# Patient Record
Sex: Male | Born: 1970 | Race: White | Hispanic: No | Marital: Single | Smoking: Former smoker
Health system: Southern US, Community
[De-identification: ages and names within clinical notes are randomized; demographics above are authoritative.]

## PROBLEM LIST (undated history)

## (undated) DIAGNOSIS — L732 Hidradenitis suppurativa: Secondary | ICD-10-CM

## (undated) DIAGNOSIS — M791 Myalgia, unspecified site: Secondary | ICD-10-CM

## (undated) DIAGNOSIS — L02415 Cutaneous abscess of right lower limb: Secondary | ICD-10-CM

## (undated) DIAGNOSIS — K429 Umbilical hernia without obstruction or gangrene: Secondary | ICD-10-CM

## (undated) DIAGNOSIS — Z8489 Family history of other specified conditions: Secondary | ICD-10-CM

## (undated) DIAGNOSIS — M519 Unspecified thoracic, thoracolumbar and lumbosacral intervertebral disc disorder: Secondary | ICD-10-CM

## (undated) DIAGNOSIS — K219 Gastro-esophageal reflux disease without esophagitis: Secondary | ICD-10-CM

## (undated) DIAGNOSIS — K2270 Barrett's esophagus without dysplasia: Secondary | ICD-10-CM

## (undated) DIAGNOSIS — H6690 Otitis media, unspecified, unspecified ear: Secondary | ICD-10-CM

## (undated) DIAGNOSIS — J40 Bronchitis, not specified as acute or chronic: Secondary | ICD-10-CM

## (undated) DIAGNOSIS — I10 Essential (primary) hypertension: Secondary | ICD-10-CM

## (undated) DIAGNOSIS — T8859XA Other complications of anesthesia, initial encounter: Secondary | ICD-10-CM

## (undated) DIAGNOSIS — E785 Hyperlipidemia, unspecified: Secondary | ICD-10-CM

## (undated) HISTORY — DX: Hidradenitis suppurativa: L73.2

## (undated) HISTORY — PX: KNEE SURGERY: SHX244

## (undated) HISTORY — PX: HERNIA REPAIR: SHX51

## (undated) HISTORY — DX: Gastro-esophageal reflux disease without esophagitis: K21.9

## (undated) HISTORY — DX: Cutaneous abscess of right lower limb: L02.415

## (undated) HISTORY — DX: Umbilical hernia without obstruction or gangrene: K42.9

## (undated) SURGERY — Surgical Case
Anesthesia: *Unknown

---

## 2000-02-08 ENCOUNTER — Encounter: Payer: Self-pay | Admitting: Emergency Medicine

## 2000-02-08 ENCOUNTER — Emergency Department (HOSPITAL_COMMUNITY): Admission: EM | Admit: 2000-02-08 | Discharge: 2000-02-08 | Payer: Self-pay | Admitting: Emergency Medicine

## 2000-02-12 ENCOUNTER — Emergency Department (HOSPITAL_COMMUNITY): Admission: EM | Admit: 2000-02-12 | Discharge: 2000-02-12 | Payer: Self-pay | Admitting: Emergency Medicine

## 2000-02-15 ENCOUNTER — Other Ambulatory Visit (HOSPITAL_COMMUNITY): Admission: RE | Admit: 2000-02-15 | Discharge: 2000-02-26 | Payer: Self-pay | Admitting: Psychiatry

## 2000-06-28 HISTORY — PX: KNEE ARTHROSCOPY WITH ANTERIOR CRUCIATE LIGAMENT (ACL) REPAIR: SHX5644

## 2000-09-19 ENCOUNTER — Emergency Department (HOSPITAL_COMMUNITY): Admission: EM | Admit: 2000-09-19 | Discharge: 2000-09-19 | Payer: Self-pay | Admitting: Emergency Medicine

## 2006-09-14 ENCOUNTER — Emergency Department (HOSPITAL_COMMUNITY): Admission: EM | Admit: 2006-09-14 | Discharge: 2006-09-14 | Payer: Self-pay | Admitting: Emergency Medicine

## 2008-11-24 ENCOUNTER — Emergency Department (HOSPITAL_COMMUNITY): Admission: EM | Admit: 2008-11-24 | Discharge: 2008-11-24 | Payer: Self-pay | Admitting: Family Medicine

## 2008-11-26 ENCOUNTER — Emergency Department (HOSPITAL_COMMUNITY): Admission: EM | Admit: 2008-11-26 | Discharge: 2008-11-26 | Payer: Self-pay | Admitting: Emergency Medicine

## 2009-01-20 ENCOUNTER — Emergency Department: Payer: Self-pay | Admitting: Emergency Medicine

## 2009-01-26 ENCOUNTER — Emergency Department: Payer: Self-pay | Admitting: Unknown Physician Specialty

## 2009-06-02 ENCOUNTER — Emergency Department (HOSPITAL_COMMUNITY): Admission: EM | Admit: 2009-06-02 | Discharge: 2009-06-02 | Payer: Self-pay | Admitting: Family Medicine

## 2010-10-05 LAB — CULTURE, ROUTINE-ABSCESS

## 2013-04-17 ENCOUNTER — Ambulatory Visit: Payer: Self-pay | Admitting: Family Medicine

## 2014-04-14 ENCOUNTER — Ambulatory Visit: Payer: Self-pay | Admitting: Physician Assistant

## 2014-04-28 ENCOUNTER — Ambulatory Visit: Payer: Self-pay | Admitting: Physician Assistant

## 2015-05-04 ENCOUNTER — Ambulatory Visit
Admission: EM | Admit: 2015-05-04 | Discharge: 2015-05-04 | Disposition: A | Discharge status: Home / Self Care | Payer: BLUE CROSS/BLUE SHIELD | Attending: Internal Medicine | Admitting: Internal Medicine

## 2015-05-04 ENCOUNTER — Encounter: Payer: Self-pay | Admitting: *Deleted

## 2015-05-04 DIAGNOSIS — J40 Bronchitis, not specified as acute or chronic: Secondary | ICD-10-CM

## 2015-05-04 DIAGNOSIS — H6501 Acute serous otitis media, right ear: Secondary | ICD-10-CM | POA: Diagnosis not present

## 2015-05-04 HISTORY — DX: Bronchitis, not specified as acute or chronic: J40

## 2015-05-04 MED ORDER — AMOXICILLIN-POT CLAVULANATE 875-125 MG PO TABS: 1.0000 | ORAL_TABLET | Freq: Two times a day (BID) | ORAL | Status: DC

## 2015-05-04 NOTE — Discharge Instructions (Signed)
One of your biggest health concerns is your smoking which increases your risk for most cancers and serious cardiovascular diseases such as strokes & heart attacks.  You should try your best to stop.  If you need assistance, please contact your PCP or Smoking Cessation Class at [facility] [phone]) or [place] (1-800-QUIT-NOW).  REST, INCREASE FLUIDS, TYLENOL AS NEEDED  MUCINEX 1-2 Twice daily

## 2015-05-04 NOTE — ED Provider Notes (Signed)
CSN: [id number]     Arrival date & time 05/04/15  4332 History   None    Chief Complaint  Patient presents with  . Cough   HPI  Bruce Graham. is a pleasant 44 y.o. male who presents with cough for 5 days.  Fever started yesterday 100-101.3.  He has productive cough with yellowish green phlegm.  He states he usually gets bronchitis once per years.  C/o nasal congestion, & ear pressure bilaterally.  Denies sore throat.  Denies headache.  He states everybody at work has been sick with similar symptoms.  He denies any chest pain.  He feels SOB when working this week but denies any wheezing or stridor.  He has tried tylenol for fever & it has helped.  He is a chronic smoker.   Past Medical History  Diagnosis Date  . Bronchitis    Past Surgical History  Procedure Laterality Date  . Knee surgery Left    History reviewed. No pertinent family history. Social History  Substance Use Topics  . Smoking status: Current Every Day Smoker  . Smokeless tobacco: Never Used  . Alcohol Use: No    Review of Systems  Constitutional: Positive for fever, chills, activity change, appetite change and fatigue. Negative for diaphoresis and unexpected weight change.  HENT: Positive for congestion, ear pain, postnasal drip, rhinorrhea, sinus pressure and sneezing. Negative for hearing loss, mouth sores, sore throat and trouble swallowing.   Eyes: Negative.   Respiratory: Positive for cough, chest tightness and shortness of breath. Negative for choking, wheezing and stridor.   Cardiovascular: Negative.   Gastrointestinal: Negative.   Genitourinary: Negative.   Musculoskeletal: Negative.   Skin: Negative.   Neurological: Negative.   Psychiatric/Behavioral: Negative.     Allergies  Review of patient's allergies indicates no known allergies.  Home Medications   Prior to Admission medications   Medication Sig Start Date End Date Taking? Authorizing Provider  amoxicillin-clavulanate (AUGMENTIN)  875-125 MG tablet Take 1 tablet by mouth 2 (two) times daily. 05/04/15   Andria Meuse, NP   Meds Ordered and Administered this Visit  Medications - No data to display  BP 135/95 mmHg  Pulse 73  Temp(Src) 97.9 F (36.6 C) (Oral)  Resp 16  Ht 5' 10"  (1.778 m)  Wt 248 lb (112.492 kg)  BMI 35.58 kg/m2  SpO2 97% No data found.   Physical Exam  Constitutional: He is oriented to person, place, and time. He appears well-developed and well-nourished. No distress.  HENT:  Head: Normocephalic and atraumatic.  Right Ear: Hearing normal. Tympanic membrane is injected, erythematous and bulging.  Left Ear: Hearing, tympanic membrane, external ear and ear canal normal.  Nose: Mucosal edema and rhinorrhea present. Right sinus exhibits no maxillary sinus tenderness and no frontal sinus tenderness. Left sinus exhibits no maxillary sinus tenderness and no frontal sinus tenderness.  Mouth/Throat: Uvula is midline, oropharynx is clear and moist and mucous membranes are normal.  Eyes: Conjunctivae are normal. No scleral icterus.  Neck: Normal range of motion.  Cardiovascular: Normal rate and regular rhythm.   Pulmonary/Chest: Effort normal and breath sounds normal. No respiratory distress.  Abdominal: Soft. Bowel sounds are normal. He exhibits no distension.  Musculoskeletal: Normal range of motion. He exhibits no edema or tenderness.  Neurological: He is alert and oriented to person, place, and time. No cranial nerve deficit.  Skin: Skin is warm and dry. No rash noted. No erythema.  Psychiatric: He has a normal mood and affect.  His behavior is normal. Judgment and thought content normal.  Vitals reviewed.   ED Course  Procedures none  MDM   1. Right acute serous otitis media, recurrence not specified   2. Bronchitis    Discussed likely viral nature of his  Bronchitis. He has a chronic smoker. Symptoms are competent of prior right acute otitis media.  He will be given antibiotic treatment for  this.  Plan: Diagnosis reviewed with patient Rx as per orders;  benefits, risks, potential side effects reviewed  Recommend supportive treatment with rest, increased fluids, tylenol as needed, Mucinex 1-2 tabs BID Seek additional medical care if symptoms worsen or are not improving  Andria Meuse, NP 05/04/15 1217

## 2015-05-04 NOTE — ED Notes (Signed)
Patient started having symptoms Wednesday 04/30/15 and started having a productive cough yesterday with fever. Congestion is green in color. Patient states that he has a history of bronchitis.

## 2015-06-15 ENCOUNTER — Ambulatory Visit
Admission: EM | Admit: 2015-06-15 | Discharge: 2015-06-15 | Disposition: A | Discharge status: Home / Self Care | Payer: BLUE CROSS/BLUE SHIELD | Attending: Family Medicine | Admitting: Family Medicine

## 2015-06-15 ENCOUNTER — Encounter: Payer: Self-pay | Admitting: Emergency Medicine

## 2015-06-15 DIAGNOSIS — L0291 Cutaneous abscess, unspecified: Secondary | ICD-10-CM

## 2015-06-15 MED ORDER — OXYCODONE-ACETAMINOPHEN 5-325 MG PO TABS: 1.0000 | ORAL_TABLET | Freq: Three times a day (TID) | ORAL | Status: DC | PRN

## 2015-06-15 MED ORDER — SULFAMETHOXAZOLE-TRIMETHOPRIM 800-160 MG PO TABS: 1.0000 | ORAL_TABLET | Freq: Two times a day (BID) | ORAL | Status: DC

## 2015-06-15 NOTE — ED Notes (Signed)
Patient states he has had a boil on the inside of his right leg for a month.  Was seen at the [facility] a month ago and had it lanced was put on antibiotics but is hasn't gotten any better

## 2015-06-15 NOTE — ED Provider Notes (Signed)
Patient presents today with symptoms of right inner thigh abscess. Patient states that he has had this for the last month. He did have it lanced month ago at [place] urgent care and he was placed on antibiotics. The area has now come back and seems to be tracking down. He denies any pain or swelling of the scrotal area. He denies any fever, myalgias, vomiting, nausea. He denies having a history of diabetes. He does admit to increased sweating and sitting for long periods. He has been soaking in warm baths and using 1 compresses on the area.  ROS: Negative except mentioned above.  Vitals as per Epic.  GENERAL: NAD RESP: CTA B CARD: RRR SKIN: small palm sized area of erythema and swelling, there is a creamy drainage expressed from one area, no streaks or extension to genital area or rectum, the area appears to be tunneling posteriorly NEURO: CN II-XII grossly intact   A/P: R Thigh Abscess- will treat patient with Bactrim DS 10 days, Percocet given for increased pain when necessary, encourage patient to continue doing warm compresses on the area, the area appears to be tracking to another site so I do recommend that patient follow up with general surgery tomorrow for further evaluation and treatment I have spoken to nurse Izora Gala and she will be contacting the patient regarding this referral to general surgery tomorrow. The area was dressed and patient was given gauze to use to keep area clean and dry.  Paulina Fusi, MD 06/15/15 [phone]

## 2015-06-16 ENCOUNTER — Observation Stay: Payer: Managed Care, Other (non HMO) | Admitting: Anesthesiology

## 2015-06-16 ENCOUNTER — Ambulatory Visit (INDEPENDENT_AMBULATORY_CARE_PROVIDER_SITE_OTHER): Payer: BLUE CROSS/BLUE SHIELD | Admitting: General Surgery

## 2015-06-16 ENCOUNTER — Observation Stay
Admission: AD | Admit: 2015-06-16 | Discharge: 2015-06-17 | Disposition: A | Discharge status: Home / Self Care | Payer: Managed Care, Other (non HMO) | Source: Ambulatory Visit | Attending: Surgery | Admitting: Surgery

## 2015-06-16 ENCOUNTER — Telehealth: Payer: Self-pay | Admitting: Emergency Medicine

## 2015-06-16 ENCOUNTER — Encounter: Payer: Self-pay | Admitting: General Surgery

## 2015-06-16 ENCOUNTER — Encounter
Admission: AD | Disposition: A | Discharge status: Home / Self Care | Payer: Self-pay | Source: Ambulatory Visit | Attending: Surgery

## 2015-06-16 ENCOUNTER — Encounter (INDEPENDENT_AMBULATORY_CARE_PROVIDER_SITE_OTHER): Payer: Self-pay

## 2015-06-16 VITALS — BP 156/97 | HR 79 | Temp 98.2°F | Ht 70.0 in | Wt 245.0 lb

## 2015-06-16 DIAGNOSIS — J449 Chronic obstructive pulmonary disease, unspecified: Secondary | ICD-10-CM | POA: Diagnosis not present

## 2015-06-16 DIAGNOSIS — F17200 Nicotine dependence, unspecified, uncomplicated: Secondary | ICD-10-CM | POA: Insufficient documentation

## 2015-06-16 DIAGNOSIS — L0291 Cutaneous abscess, unspecified: Secondary | ICD-10-CM | POA: Diagnosis present

## 2015-06-16 DIAGNOSIS — Z801 Family history of malignant neoplasm of trachea, bronchus and lung: Secondary | ICD-10-CM | POA: Diagnosis not present

## 2015-06-16 DIAGNOSIS — B998 Other infectious disease: Secondary | ICD-10-CM | POA: Insufficient documentation

## 2015-06-16 DIAGNOSIS — Z23 Encounter for immunization: Secondary | ICD-10-CM | POA: Insufficient documentation

## 2015-06-16 DIAGNOSIS — L02415 Cutaneous abscess of right lower limb: Principal | ICD-10-CM | POA: Insufficient documentation

## 2015-06-16 DIAGNOSIS — J40 Bronchitis, not specified as acute or chronic: Secondary | ICD-10-CM | POA: Diagnosis not present

## 2015-06-16 DIAGNOSIS — Z79899 Other long term (current) drug therapy: Secondary | ICD-10-CM | POA: Insufficient documentation

## 2015-06-16 DIAGNOSIS — K219 Gastro-esophageal reflux disease without esophagitis: Secondary | ICD-10-CM | POA: Insufficient documentation

## 2015-06-16 DIAGNOSIS — Z808 Family history of malignant neoplasm of other organs or systems: Secondary | ICD-10-CM | POA: Insufficient documentation

## 2015-06-16 DIAGNOSIS — Z8249 Family history of ischemic heart disease and other diseases of the circulatory system: Secondary | ICD-10-CM | POA: Insufficient documentation

## 2015-06-16 HISTORY — PX: INCISION AND DRAINAGE PERIRECTAL ABSCESS: SHX1804

## 2015-06-16 HISTORY — DX: Gastro-esophageal reflux disease without esophagitis: K21.9

## 2015-06-16 HISTORY — DX: Cutaneous abscess of right lower limb: L02.415

## 2015-06-16 SURGERY — INCISION AND DRAINAGE, ABSCESS, PERIRECTAL
Anesthesia: General | Site: Leg Upper | Laterality: Right | Wound class: Contaminated

## 2015-06-16 MED ORDER — OXYCODONE-ACETAMINOPHEN 5-325 MG PO TABS
1.0000 | ORAL_TABLET | Freq: Three times a day (TID) | ORAL | Status: DC | PRN
  Administered 2015-06-17: 2 via ORAL
  Filled 2015-06-16 (×2): qty 2

## 2015-06-16 MED ORDER — CEFAZOLIN SODIUM 1-5 GM-% IV SOLN
1.0000 g | Freq: Four times a day (QID) | INTRAVENOUS | Status: DC
  Administered 2015-06-16 – 2015-06-17 (×4): 1 g via INTRAVENOUS
  Filled 2015-06-16 (×8): qty 50

## 2015-06-16 MED ORDER — ONDANSETRON HCL 4 MG/2ML IJ SOLN
INTRAMUSCULAR | Status: AC
  Filled 2015-06-16: qty 2

## 2015-06-16 MED ORDER — FENTANYL CITRATE (PF) 100 MCG/2ML IJ SOLN
25.0000 ug | INTRAMUSCULAR | Status: AC | PRN
  Administered 2015-06-16 (×6): 25 ug via INTRAVENOUS

## 2015-06-16 MED ORDER — LIDOCAINE HCL (CARDIAC) 20 MG/ML IV SOLN
INTRAVENOUS | Status: DC | PRN
  Administered 2015-06-16: 30 mg via INTRAVENOUS

## 2015-06-16 MED ORDER — ONDANSETRON HCL 4 MG/2ML IJ SOLN
4.0000 mg | Freq: Once | INTRAMUSCULAR | Status: AC | PRN
  Administered 2015-06-16: 4 mg via INTRAVENOUS

## 2015-06-16 MED ORDER — MORPHINE SULFATE (PF) 2 MG/ML IV SOLN
2.0000 mg | INTRAVENOUS | Status: DC | PRN
  Administered 2015-06-16 – 2015-06-17 (×3): 2 mg via INTRAVENOUS
  Filled 2015-06-16 (×3): qty 1

## 2015-06-16 MED ORDER — FENTANYL CITRATE (PF) 100 MCG/2ML IJ SOLN
INTRAMUSCULAR | Status: AC
  Filled 2015-06-16: qty 2

## 2015-06-16 MED ORDER — MIDAZOLAM HCL 2 MG/2ML IJ SOLN
INTRAMUSCULAR | Status: DC | PRN
  Administered 2015-06-16: 2 mg via INTRAVENOUS

## 2015-06-16 MED ORDER — ACETAMINOPHEN 325 MG PO TABS
650.0000 mg | ORAL_TABLET | Freq: Four times a day (QID) | ORAL | Status: DC | PRN
  Administered 2015-06-17: 650 mg via ORAL
  Filled 2015-06-16: qty 2

## 2015-06-16 MED ORDER — FENTANYL CITRATE (PF) 100 MCG/2ML IJ SOLN
INTRAMUSCULAR | Status: DC | PRN
  Administered 2015-06-16: 100 ug via INTRAVENOUS

## 2015-06-16 MED ORDER — CEFAZOLIN (ANCEF) 1 G IV SOLR
1.0000 g | Freq: Four times a day (QID) | INTRAVENOUS | Status: DC
  Filled 2015-06-16 (×2): qty 1

## 2015-06-16 MED ORDER — HYDROCODONE-ACETAMINOPHEN 5-325 MG PO TABS
1.0000 | ORAL_TABLET | ORAL | Status: DC | PRN
  Administered 2015-06-17: 2 via ORAL
  Filled 2015-06-16: qty 2

## 2015-06-16 MED ORDER — ACETAMINOPHEN 650 MG RE SUPP: 650.0000 mg | Freq: Four times a day (QID) | RECTAL | Status: DC | PRN

## 2015-06-16 MED ORDER — FENTANYL CITRATE (PF) 100 MCG/2ML IJ SOLN
INTRAMUSCULAR | Status: AC
Start: 2015-06-16 — End: 2015-06-16
  Filled 2015-06-16: qty 2

## 2015-06-16 MED ORDER — LACTATED RINGERS IV SOLN
INTRAVENOUS | Status: DC | PRN
  Administered 2015-06-16: 20:00:00 via INTRAVENOUS

## 2015-06-16 MED ORDER — INFLUENZA VAC SPLIT QUAD 0.5 ML IM SUSY
0.5000 mL | PREFILLED_SYRINGE | INTRAMUSCULAR | Status: AC
  Administered 2015-06-17: 0.5 mL via INTRAMUSCULAR
  Filled 2015-06-16: qty 0.5

## 2015-06-16 MED ORDER — KCL IN DEXTROSE-NACL 20-5-0.45 MEQ/L-%-% IV SOLN
INTRAVENOUS | Status: DC
  Administered 2015-06-16: 23:00:00 via INTRAVENOUS
  Administered 2015-06-17: 1000 mL via INTRAVENOUS
  Filled 2015-06-16 (×5): qty 1000

## 2015-06-16 MED ORDER — PROPOFOL 10 MG/ML IV BOLUS
INTRAVENOUS | Status: DC | PRN
  Administered 2015-06-16: 200 mg via INTRAVENOUS

## 2015-06-16 SURGICAL SUPPLY — 23 items
BLADE SURG 15 STRL LF DISP TIS (BLADE) ×1 IMPLANT
BLADE SURG 15 STRL SS (BLADE)
BLADE SURG SZ11 CARB STEEL (BLADE) ×1 IMPLANT
BRIEF STRETCH MATERNITY 2XLG (MISCELLANEOUS) ×1 IMPLANT
CANISTER SUCT 1200ML W/VALVE (MISCELLANEOUS) ×1 IMPLANT
DRAIN PENROSE 1/4X12 LTX (DRAIN) ×3 IMPLANT
DRAPE LAPAROTOMY 100X77 ABD (DRAPES) ×1 IMPLANT
DRAPE LEGGINS SURG 28X43 STRL (DRAPES) ×3 IMPLANT
DRAPE UNDER BUTTOCK W/FLU (DRAPES) ×3 IMPLANT
GAUZE IODOFORM PACK 1/2 7832 (GAUZE/BANDAGES/DRESSINGS) ×1 IMPLANT
GAUZE SPONGE 4X4 12PLY STRL (GAUZE/BANDAGES/DRESSINGS) ×1 IMPLANT
GLOVE BIO SURGEON STRL SZ8 (GLOVE) ×8 IMPLANT
GOWN STRL REUS W/ TWL LRG LVL3 (GOWN DISPOSABLE) ×2 IMPLANT
GOWN STRL REUS W/TWL LRG LVL3 (GOWN DISPOSABLE) ×6
KIT RM TURNOVER STRD PROC AR (KITS) ×3 IMPLANT
LABEL OR SOLS (LABEL) ×1 IMPLANT
NS IRRIG 500ML POUR BTL (IV SOLUTION) ×3 IMPLANT
PACK BASIN MINOR ARMC (MISCELLANEOUS) ×3 IMPLANT
PAD ABD DERMACEA PRESS 5X9 (GAUZE/BANDAGES/DRESSINGS) ×4 IMPLANT
PREP PVP WINGED SPONGE (MISCELLANEOUS) ×3 IMPLANT
SUT ETH BLK MONO 3 0 FS 1 12/B (SUTURE) ×3 IMPLANT
SUT NYLON 2-0 (SUTURE) ×1 IMPLANT
SWAB CULTURE AMIES ANAERIB BLU (MISCELLANEOUS) ×3 IMPLANT

## 2015-06-16 NOTE — Anesthesia Preprocedure Evaluation (Signed)
Anesthesia Evaluation  Patient identified by MRN, date of birth, ID band Patient awake    Reviewed: Allergy & Precautions, NPO status , Patient's Chart, lab work & pertinent test results  Airway Mallampati: II       Dental  (+) Teeth Intact   Pulmonary COPD, Current Smoker,    + rhonchi  + decreased breath sounds      Cardiovascular Exercise Tolerance: Good  Rhythm:Regular Rate:Normal     Neuro/Psych    GI/Hepatic Neg liver ROS, GERD  ,  Endo/Other  negative endocrine ROS  Renal/GU negative Renal ROS     Musculoskeletal   Abdominal Normal abdominal exam  (+)   Peds  Hematology negative hematology ROS (+)   Anesthesia Other Findings   Reproductive/Obstetrics                             Anesthesia Physical Anesthesia Plan  ASA: II and emergent  Anesthesia Plan: General   Post-op Pain Management:    Induction: Intravenous  Airway Management Planned: LMA  Additional Equipment:   Intra-op Plan:   Post-operative Plan: Extubation in OR  Informed Consent: I have reviewed the patients History and Physical, chart, labs and discussed the procedure including the risks, benefits and alternatives for the proposed anesthesia with the patient or authorized representative who has indicated his/her understanding and acceptance.     Plan Discussed with: CRNA  Anesthesia Plan Comments:         Anesthesia Quick Evaluation

## 2015-06-16 NOTE — H&P (Signed)
Patient ID: Bruce Graham, male   DOB: 1971-03-26, 44 y.o.   MRN: [id number]   CC: thigh absces   HPI Bruce Graham is a 44 y.o. male  Was sent to clinic from the urgent care center for evaluation of a right upper inner thigh abscess. Patient states that he had it drained about a month ago and since that time is a continued drainage from one spot that worsening of the abscess. It has continued progressed where he sought care and emergent secured headache the area that was drained previously has been draining a white to yellow cream drainage. It has been hot to the touch and has been spreading towards the scrotum and towards his rectum from that spot. He's had subjective fevers but denies any chills. He denies any nausea, vomiting, diarrhea, constipation, chest pain, short of breath.   HPI    Past Medical History   Diagnosis  Date   .  Bronchitis     .  GERD (gastroesophageal reflux disease)  06/16/2015       Past Surgical History   Procedure  Laterality  Date   .  Knee surgery  Left         Family History   Problem  Relation  Age of Onset   .  Heart disease  Father  55   .  Cancer  Father         Lung Cancer      Social History Social History   Substance Use Topics   .  Smoking status:  Current Every Day Smoker -- 1.00 packs/day       Types:  Cigarettes   .  Smokeless tobacco:  Never Used   .  Alcohol Use:  No      No Known Allergies    Current Outpatient Prescriptions   Medication  Sig  Dispense  Refill   .  oxyCODONE-acetaminophen (PERCOCET/ROXICET) 5-325 MG tablet  Take 1-2 tablets by mouth every 8 (eight) hours as needed for severe pain.  12 tablet  0   .  sulfamethoxazole-trimethoprim (BACTRIM DS,SEPTRA DS) 800-160 MG tablet  Take 1 tablet by mouth 2 (two) times daily.  20 tablet  0       No current facility-administered medications for this visit.        Review of Systems A  Multi-point review of systems was asked and was negative except for the  Findings  documented in the history of present illness   Physical Exam Blood pressure 156/97, pulse 79, temperature 98.2 F (36.8 C), temperature source Oral, height 5' 10"  (1.778 m), weight 111.131 kg (245 lb). CONSTITUTIONAL:   No acute distress. EYES: Pupils are equal, round, and reactive to light, Sclera are non-icteric. EARS, NOSE, MOUTH AND THROAT: The oropharynx is clear. The oral mucosa is pink and moist. Hearing is intact to voice. LYMPH NODES:  Lymph nodes in the neck are normal. RESPIRATORY:  Lungs are clear. There is normal respiratory effort, with equal breath sounds bilaterally, and without pathologic use of accessory muscles. CARDIOVASCULAR: Heart is regular without murmurs, gallops, or rubs. GI: The abdomen is soft, nontender, and nondistended. There are no palpable masses. There is no hepatosplenomegaly. There are normal bowel sounds in all quadrants. GU: Rectal deferred.    MUSCULOSKELETAL: Normal muscle strength and tone. No cyanosis or edema.    SKIN:  Large abscess to the right upper inner thigh. Measures 8 x 6 cm. It is warm to the touch with expressible purulence from  a pen dotopening on the most cephalad component. NEUROLOGIC: Motor and sensation is grossly normal. Cranial nerves are grossly intact. PSYCH:  Oriented to person, place and time. Affect is normal.   Data Reviewed  no data to review I have personally reviewed the patient's imaging, laboratory findings and medical records.     Assessment  44 year old male with a large right upper inner thigh abscess    Plan  large abscess. Given the size and location of this I discussed with the patient that it would be safest to have this drained in the operating room. Case was discussed with my partner Dr. Marina Gravel  Who is on-call hospital and agrees. Plan for direct admission to the surgical services at [facility]. There he'll get IV antibiotics and a surgical drainage of this abscess. Discussed that drain placement versus packing may  be indicated.   Time spent with the patient was 45 minutes, with more than 50% of the time spent in face-to-face education, counseling and care coordination.       Clayburn Pert, MD FACS General Surgeon 06/16/2015, 4:33 PM

## 2015-06-16 NOTE — Anesthesia Procedure Notes (Signed)
Procedure Name: LMA Insertion Date/Time: 06/16/2015 8:19 PM Performed by: Lendon Colonel Pre-anesthesia Checklist: Patient identified, Emergency Drugs available, Suction available, Patient being monitored and Timeout performed Patient Re-evaluated:Patient Re-evaluated prior to inductionPreoxygenation: Pre-oxygenation with 100% oxygen Intubation Type: IV induction Ventilation: Mask ventilation without difficulty LMA: LMA inserted LMA Size: 4.5 Number of attempts: 1 Placement Confirmation: positive ETCO2

## 2015-06-16 NOTE — Progress Notes (Signed)
Patient ID: Bruce Graham, male   DOB: July 03, 1970, 44 y.o.   MRN: [id number]  CC: thigh absces  HPI Bruce Graham is a 44 y.o. male  Was sent to clinic from the urgent care center for evaluation of a right upper inner thigh abscess. Patient states that he had it drained about a month ago and since that time is a continued drainage from one spot that worsening of the abscess. It has continued progressed where he sought care and emergent secured headache the area that was drained previously has been draining a white to yellow cream drainage. It has been hot to the touch and has been spreading towards the scrotum and towards his rectum from that spot. He's had subjective fevers but denies any chills. He denies any nausea, vomiting, diarrhea, constipation, chest pain, short of breath.  HPI  Past Medical History  Diagnosis Date  . Bronchitis   . GERD (gastroesophageal reflux disease) 06/16/2015    Past Surgical History  Procedure Laterality Date  . Knee surgery Left     Family History  Problem Relation Age of Onset  . Heart disease Father 42  . Cancer Father     Lung Cancer    Social History Social History  Substance Use Topics  . Smoking status: Current Every Day Smoker -- 1.00 packs/day    Types: Cigarettes  . Smokeless tobacco: Never Used  . Alcohol Use: No    No Known Allergies  Current Outpatient Prescriptions  Medication Sig Dispense Refill  . oxyCODONE-acetaminophen (PERCOCET/ROXICET) 5-325 MG tablet Take 1-2 tablets by mouth every 8 (eight) hours as needed for severe pain. 12 tablet 0  . sulfamethoxazole-trimethoprim (BACTRIM DS,SEPTRA DS) 800-160 MG tablet Take 1 tablet by mouth 2 (two) times daily. 20 tablet 0   No current facility-administered medications for this visit.     Review of Systems A  Multi-point review of systems was asked and was negative except for the  Findings documented in the history of present illness  Physical Exam Blood pressure 156/97,  pulse 79, temperature 98.2 F (36.8 C), temperature source Oral, height 5' 10"  (1.778 m), weight 111.131 kg (245 lb). CONSTITUTIONAL:   No acute distress. EYES: Pupils are equal, round, and reactive to light, Sclera are non-icteric. EARS, NOSE, MOUTH AND THROAT: The oropharynx is clear. The oral mucosa is pink and moist. Hearing is intact to voice. LYMPH NODES:  Lymph nodes in the neck are normal. RESPIRATORY:  Lungs are clear. There is normal respiratory effort, with equal breath sounds bilaterally, and without pathologic use of accessory muscles. CARDIOVASCULAR: Heart is regular without murmurs, gallops, or rubs. GI: The abdomen is soft, nontender, and nondistended. There are no palpable masses. There is no hepatosplenomegaly. There are normal bowel sounds in all quadrants. GU: Rectal deferred.   MUSCULOSKELETAL: Normal muscle strength and tone. No cyanosis or edema.   SKIN:  Large abscess to the right upper inner thigh. Measures 8 x 6 cm. It is warm to the touch with expressible purulence from a pen dotopening on the most cephalad component. NEUROLOGIC: Motor and sensation is grossly normal. Cranial nerves are grossly intact. PSYCH:  Oriented to person, place and time. Affect is normal.  Data Reviewed  no data to review I have personally reviewed the patient's imaging, laboratory findings and medical records.    Assessment     44 year old male with a large right upper inner thigh abscess     Plan     large abscess. Given the size and  location of this I discussed with the patient that it would be safest to have this drained in the operating room. Case was discussed with my partner Dr. Marina Gravel  Who is on-call hospital and agrees. Plan for direct admission to the surgical services at [facility]. There he'll get IV antibiotics and a surgical drainage of this abscess. Discussed that drain placement versus packing may be indicated.     Time spent with the patient was 45 minutes, with more than 50%  of the time spent in face-to-face education, counseling and care coordination.     Clayburn Pert, MD FACS General Surgeon 06/16/2015, 4:33 PM

## 2015-06-16 NOTE — Progress Notes (Signed)
Spoke to Dr. Marina Gravel. Discontinue order for Ancef powder and place order for Ancef 1 gram IV 4 times daily. Med entered in error.

## 2015-06-16 NOTE — Transfer of Care (Signed)
Immediate Anesthesia Transfer of Care Note  Patient: Bruce Graham  Procedure(s) Performed: Procedure(s): [place] right inner thigh ABSCESS (Right)  Patient Location: PACU  Anesthesia Type:General  Level of Consciousness: awake, alert  and patient cooperative  Airway & Oxygen Therapy: Patient Spontanous Breathing  Post-op Assessment: Report given to RN and Post -op Vital signs reviewed and stable  Post vital signs: Reviewed and stable  Last Vitals:  Filed Vitals:   06/16/15 1951 06/16/15 2055  BP: 138/87 111/75  Pulse: 69 103  Temp: 36.7 C 36.9 C  Resp: 18 22    Complications: No apparent anesthesia complications

## 2015-06-16 NOTE — Op Note (Signed)
06/16/2015  8:46 PM  PATIENT:  Bruce Graham  44 y.o. male  PRE-OPERATIVE DIAGNOSIS: Right thigh abscess  POST-OPERATIVE DIAGNOSIS:  Same  PROCEDURE: Incision and drainage of right thigh abscess  SURGEON:  Florene Glen MD, FACS   ANESTHESIA:  Gen. with LMA   Details of Procedure: This a patient with a right thigh abscess she's been reviewed in the rationale for surgery is been discussed the risk bleeding infection recurrence open wound versus Penrose drain placement was all reviewed with he and his wife they understood and agreed to proceed  Findings extensive hidradenitis scarring of both groin creases perianal and both medial thighs an peri-scrotal areas.  Patient was induced general anesthesia and placed in a high lithotomy position identified and a surgical positive was performed.  Examination under anesthesia was performed and a large abscess which was believed to be fairly superficial was incised at the more anterior portion of the thigh abscess once exuded and was cultured. A clamp was placed into this cavity and the posterior section was incised and irrigated and then a Penrose drain was placed into the cavity and sutured into both hands. A second area measuring approximate 1 cm of fluctuance was identified and opened with a clamp and was very superficial and did not require any packing. Dressing was placed he was taken to the recovery room in stable condition sponge lap needle count was correct.   Florene Glen, MD FACS

## 2015-06-16 NOTE — Progress Notes (Signed)
Preoperative Review   Patient is met in the preoperative holding area. The history is reviewed in the chart and with the patient. I personally reviewed the options and rationale as well as the risks of this procedure that have been previously discussed with the patient. All questions asked by the patient and/or family were answered to their satisfaction. Patient marked appropriately. I reviewed the risks options and plan procedure with patient and wife. Patient agrees to proceed with this procedure at this time.  Florene Glen M.D. FACS

## 2015-06-16 NOTE — Anesthesia Postprocedure Evaluation (Signed)
Anesthesia Post Note  Patient: Bruce Graham  Procedure(s) Performed: Procedure(s) (LRB): IRRIGATION AND DEBRIDEMENT right inner thigh ABSCESS (Right)  Patient location during evaluation: PACU Anesthesia Type: General Level of consciousness: awake Pain management: pain level controlled Vital Signs Assessment: post-procedure vital signs reviewed and stable Respiratory status: spontaneous breathing Cardiovascular status: stable Anesthetic complications: no    Last Vitals:  Filed Vitals:   06/16/15 2055 06/16/15 2110  BP: 111/75 121/78  Pulse: 103 87  Temp: 36.9 C   Resp: 22 18    Last Pain:  Filed Vitals:   06/16/15 2115  PainSc: 3                  VAN STAVEREN,Neville [place]

## 2015-06-16 NOTE — ED Notes (Signed)
Patient is scheduled at [facility] for 3:30pm today 06/16/15 for further evaluation of his abscess.  There was no answer when I tried to call him and his mailbox was full and therefore could not leave him a message.  I will try contacting patient again.

## 2015-06-16 NOTE — ED Notes (Signed)
Patient returned my phone call and was informed to be at [facility] at 3:30pm today for his appointment.  Patient verbalized understanding.

## 2015-06-16 NOTE — Patient Instructions (Signed)
We are direct admitting you to the hospital. You will be in room 229. Please proceed to [facility] and check in at the Admitting/Registration desk in the [place].  Do not eat or drink anything on your way to the hospital.

## 2015-06-16 NOTE — OR Nursing (Signed)
Patient came from OR with coughing and clear sputum.  C/o some drainage from nose.

## 2015-06-17 ENCOUNTER — Encounter: Payer: Self-pay | Admitting: *Deleted

## 2015-06-17 DIAGNOSIS — L02415 Cutaneous abscess of right lower limb: Secondary | ICD-10-CM | POA: Diagnosis not present

## 2015-06-17 LAB — GLUCOSE, CAPILLARY: Glucose-Capillary: 123 mg/dL — ABNORMAL HIGH (ref 65–99)

## 2015-06-17 MED ORDER — HYDROCODONE-ACETAMINOPHEN 5-325 MG PO TABS: 1.0000 | ORAL_TABLET | Freq: Four times a day (QID) | ORAL | Status: DC | PRN

## 2015-06-17 MED ORDER — SULFAMETHOXAZOLE-TRIMETHOPRIM 800-160 MG PO TABS: 1.0000 | ORAL_TABLET | Freq: Two times a day (BID) | ORAL | Status: AC

## 2015-06-17 NOTE — Progress Notes (Signed)
Patient discharged to home, dressing to right inner thigh changed today, dressing clean, dry and intact. IV to discontinued site without s/s of infiltration or infection. Discharge instructions given as ordered. Patient instructed to call Dr. Burt Knack office in the morning and make an appointment for one week. Patient wife at the bedside to take patient home. Prescriptions given as ordered.

## 2015-06-17 NOTE — Progress Notes (Signed)
Patient ID: Bruce Graham, male   DOB: [birth year], 44 y.o.   MRN: [id number]   Feels better Wants to go home  Filed Vitals:   06/16/15 2158 06/16/15 2332 06/17/15 0518 06/17/15 1227  BP: 114/80 124/80 135/86 138/72  Pulse: 74 77 62 82  Temp: 97.5 F (36.4 C) 98.2 F (36.8 C) 97.7 F (36.5 C) 97.8 F (36.6 C)  TempSrc: Oral Oral Oral Oral  Resp: 16 18 18 18   Height:      Weight:      SpO2: 90% 96% 99% 97%    Dressing intact, not taken down.  Advance diet continue ancef, likely home this afternoon.

## 2015-06-19 LAB — WOUND CULTURE: Culture: NO GROWTH

## 2015-06-23 LAB — ANAEROBIC CULTURE

## 2015-06-24 ENCOUNTER — Ambulatory Visit (INDEPENDENT_AMBULATORY_CARE_PROVIDER_SITE_OTHER): Payer: BLUE CROSS/BLUE SHIELD | Admitting: Surgery

## 2015-06-24 ENCOUNTER — Encounter: Payer: Self-pay | Admitting: Surgery

## 2015-06-24 VITALS — BP 144/85 | HR 73 | Temp 98.5°F | Ht 70.0 in | Wt 240.0 lb

## 2015-06-24 DIAGNOSIS — L03319 Cellulitis of trunk, unspecified: Secondary | ICD-10-CM

## 2015-06-24 DIAGNOSIS — L02219 Cutaneous abscess of trunk, unspecified: Secondary | ICD-10-CM

## 2015-06-24 DIAGNOSIS — L732 Hidradenitis suppurativa: Secondary | ICD-10-CM

## 2015-06-24 NOTE — Patient Instructions (Signed)
Continue your antibiotics until they are completely gone.  You will want to keep a dressing over this area at all times until draining as stopped completely. Try to keep this area as clean as possible.  Please call if you have any questions or concerns.

## 2015-06-24 NOTE — Progress Notes (Signed)
he is status post I&D of a right groin abscess. He has long-standing hidradenitis of both groins perianal and axillary areas.  as no complaints as a Penrose drain in place.  Wound is clean no erythema minimal purulence  Penrose drain is removed and a dressing is placed    Patient willfollow-up  As needed but finish out his antibiotics at this point.  pathology are reviewed.

## 2015-07-08 ENCOUNTER — Telehealth: Payer: Self-pay | Admitting: Surgery

## 2015-07-08 NOTE — Telephone Encounter (Signed)
Returned phone call to patient at this time. Patient states that he finished antibiotics on 07/04/15 and was completely pain free at that time. Now, area is becoming red once again, pain is getting worse and at times becomes intense, and is still draining. Patient describes drainage as "bloody with some pus but nothing like it was before." Denies fever, chills, or nausea and vomiting. Informed patient that we need to have him seen by a surgeon tomorrow. Patient was placed on schedule to see Dr. Adonis Huguenin at 2:45pm tomorrow.

## 2015-07-08 NOTE — Telephone Encounter (Signed)
Patient called this morning surgery site is starting to hurt again, at times intense and still some drainage. Please call patient. Patient said he was fine and did not need to go to the ED.

## 2015-07-09 ENCOUNTER — Encounter: Payer: Self-pay | Admitting: General Surgery

## 2015-07-09 ENCOUNTER — Ambulatory Visit: Payer: Self-pay | Admitting: General Surgery

## 2015-07-09 ENCOUNTER — Ambulatory Visit (INDEPENDENT_AMBULATORY_CARE_PROVIDER_SITE_OTHER): Payer: Managed Care, Other (non HMO) | Admitting: General Surgery

## 2015-07-09 VITALS — BP 154/89 | HR 92 | Temp 98.1°F | Ht 70.0 in | Wt 246.6 lb

## 2015-07-09 DIAGNOSIS — L02415 Cutaneous abscess of right lower limb: Secondary | ICD-10-CM | POA: Diagnosis not present

## 2015-07-09 MED ORDER — OXYCODONE-ACETAMINOPHEN 5-325 MG PO TABS: 1.0000 | ORAL_TABLET | ORAL | Status: DC | PRN

## 2015-07-09 MED ORDER — CLINDAMYCIN HCL 300 MG PO CAPS: 300.0000 mg | ORAL_CAPSULE | Freq: Three times a day (TID) | ORAL | Status: DC

## 2015-07-09 NOTE — Patient Instructions (Signed)
We have sent in Clindamycin for your antibiotic to your pharmacy.  Please follow-up in our office in 1 week. I will call you with the results to your culture if they come back prior to your appointment.

## 2015-07-09 NOTE — Progress Notes (Signed)
Outpatient Surgical Follow Up  07/09/2015  Bruce Graham is an 45 y.o. male.   Chief Complaint  Patient presents with  . Post-op Problem    I&D of Right Thigh Abscess (12/19)- Dr. Burt Knack- Having Increased Pain at site    HPI:  45 year old male returns to clinic for follow-up to do increasing redness and drainage from his previous lead drained right upper thigh abscess. Patient states that 3 days before coming to clinic he noticed that the area started to become red and painful again. He had completed antibiotics last week. He states that last night he applied pressure to the upper incision site and a large amount of red drainage was expelled from the lower incision site. The area has been asked was only painful. He denies any fevers, chills, nausea, vomiting, diarrhea, constipation.  Past Medical History  Diagnosis Date  . Bronchitis   . GERD (gastroesophageal reflux disease) 06/16/2015    Past Surgical History  Procedure Laterality Date  . Knee surgery Left   . Incision and drainage perirectal abscess Right 06/16/2015    Procedure: IRRIGATION AND DEBRIDEMENT right inner thigh ABSCESS;  Surgeon: Florene Glen, MD;  Location: [facility] ORS;  Service: General;  Laterality: Right;    Family History  Problem Relation Age of Onset  . Heart disease Father 17  . Cancer Father     Lung Cancer    Social History:  reports that he has been smoking Cigarettes.  He has been smoking about 1.00 pack per day. He has never used smokeless tobacco. He reports that he does not drink alcohol or use illicit drugs.  Allergies: No Known Allergies  Medications reviewed.    ROS  a multipoint review of systems was completed, all pertinent positives and negatives were within the history of present illness the remainder negative.   BP 154/89 mmHg  Pulse 92  Temp(Src) 98.1 F (36.7 C) (Oral)  Ht 5' 10"  (1.778 m)  Wt 111.857 kg (246 lb 9.6 oz)  BMI 35.38 kg/m2  Physical Exam  Gen.: No acute  distress taxine chest: Clear to all sedation Heart: Regular rate and rhythm Abdomen: Soft, nontender, nondistended Skin: previous I&D spot is raised, erythematous, with active drainage purulent fluid from a pinpoint opening. The area of erythema measures approximately 5 x 5 cm from the central area of drainage.    No results found for this or any previous visit (from the past 48 hour(s)). No results found.  Assessment/Plan:  1. Abscess of right thigh  patient with recurrent abscess of his right thigh. Discussed the need for drainage of the area. The consent was obtained for incision and drainage of the right thigh abscess. The area was localized with a 50-50 mixture of 1% lidocaine with epinephrine and Neut. The central area of drainage was then incised with 11 blade scalpel and a new culture was obtained. All of the fluid that could be expressed was then removed from the area. The entire wound was then copiously irrigated with irrigation returned clear. A plane gauze dressing was secured over this. And tolerated the procedure well.   Discussed need for a new course of antibiotics. Due to the timeframe in which the abscesses recurred we'll change antibiotics from Bactrim to clindamycin. Culture obtained and we'll change antibiotics should the culture show bacteria that warrants the different treatment.   Plan for follow-up in clinic in 1 week for additional wound check and to ensure resolution.  - oxyCODONE-acetaminophen (ROXICET) 5-325 MG tablet; Take 1  tablet by mouth every 4 (four) hours as needed for severe pain.  Dispense: 20 tablet; Refill: 0 - clindamycin (CLEOCIN) 300 MG capsule; Take 1 capsule (300 mg total) by mouth 3 (three) times daily.  Dispense: 30 capsule; Refill: 0     Clayburn Pert, MD [facility]  07/09/2015,11:49 AM

## 2015-07-18 ENCOUNTER — Encounter: Payer: Self-pay | Admitting: Surgery

## 2015-07-18 ENCOUNTER — Ambulatory Visit (INDEPENDENT_AMBULATORY_CARE_PROVIDER_SITE_OTHER): Payer: BLUE CROSS/BLUE SHIELD | Admitting: Surgery

## 2015-07-18 VITALS — BP 144/81 | HR 91 | Temp 97.6°F | Wt 245.0 lb

## 2015-07-18 DIAGNOSIS — L732 Hidradenitis suppurativa: Secondary | ICD-10-CM

## 2015-07-18 DIAGNOSIS — L02415 Cutaneous abscess of right lower limb: Secondary | ICD-10-CM

## 2015-07-18 MED ORDER — CLINDAMYCIN HCL 300 MG PO CAPS: 300.0000 mg | ORAL_CAPSULE | Freq: Three times a day (TID) | ORAL | Status: DC

## 2015-07-18 NOTE — Progress Notes (Signed)
Is a patient with hidradenitis suppurativa and multiple abscesses he has had drained on the right thigh most recently in the hospital and then a recurrent's drained by Dr. Adonis Huguenin in the office.  Patient feels better now but is still able to express a small amount of pus. He has 2 days left of clindamycin.  Patient is afebrile. Right thigh abscess site is healing well with minimal erythema and expressible one drop of purulence. Basically nontender. Hidradenitis throughout the perineum and groin.  Patient on clindamycin refill the antibiotic and see next week.

## 2015-07-24 ENCOUNTER — Telehealth: Payer: Self-pay | Admitting: General Surgery

## 2015-07-24 NOTE — Telephone Encounter (Signed)
Returned phone call to patient at this time. After speaking with Dr. Adonis Huguenin he would like patient to finish antibiotics and then see Dr. Burt Knack as planned next week.  Offered to move patient's appointment up to Monday, patient states, "Monday is bad for me." and did not want to move his appointment.  Encouraged to call or go to Emergency Room if patient develops fever, severe pain, or increased redness prior to seeing Dr. Burt Knack.

## 2015-07-24 NOTE — Telephone Encounter (Signed)
Feels the antibiotic has stopped working. The area is draining and starting to get sore and a little bit of redness. Please call.

## 2015-07-30 ENCOUNTER — Encounter: Payer: Self-pay | Admitting: Surgery

## 2015-07-30 ENCOUNTER — Ambulatory Visit (INDEPENDENT_AMBULATORY_CARE_PROVIDER_SITE_OTHER): Payer: BLUE CROSS/BLUE SHIELD | Admitting: Surgery

## 2015-07-30 VITALS — Ht 70.0 in

## 2015-07-30 DIAGNOSIS — L02415 Cutaneous abscess of right lower limb: Secondary | ICD-10-CM

## 2015-07-30 MED ORDER — METRONIDAZOLE 500 MG PO TABS: 500.0000 mg | ORAL_TABLET | Freq: Three times a day (TID) | ORAL | Status: DC

## 2015-07-30 MED ORDER — OXYCODONE-ACETAMINOPHEN 5-325 MG PO TABS: 1.0000 | ORAL_TABLET | ORAL | Status: DC | PRN

## 2015-07-30 NOTE — Patient Instructions (Addendum)
We have opened the area in your right thigh up again today. The packing will fall out in the next several days. You may shower as normal.  We have also sent in a different antibiotic to your pharmacy. Please pick this up today and take this until it is gone.  We will see you back in the office next week as scheduled below.

## 2015-07-30 NOTE — Progress Notes (Signed)
Outpatient postop visit  07/30/2015  Bruce Graham is an 45 y.o. male.    Procedure: I&D  CC:rt thigh pain  HPI: Patient states that it opened and drained again last week and he has had continued pain and swelling. His finish his clindamycin.  Medications reviewed.    Physical Exam:  There were no vitals taken for this visit.    PE: Minimal erythema but fluctuance in 2 areas with some tenderness suggestive of superficial subcutaneous abscesses    Assessment/Plan:  Recommend incision and drainage Options were discussed as were the rationale for offering this procedure he has 2 separate areas that require incision and drainage several centimeters apart I believe that there are the old openings from the [place]rose drains.  A she understood and agreed to proceed with this plan.  Incision and drainage performed as follows: Septic condition were applied and local anesthetic was placed. Incision was performed in 2 separate areas no pus was removed but minimal serous fluid cultures were obtained. Packing was placed. He is instructed to remove the packing tomorrow and shower.  This finishes clindamycin and review of the antibiotic gram for Prevotella spp. Suggests that it is a gram-negative anaerobe and that metronidazole may be a better choice. I will place him on metronidazole 3 times a day for 10 days and see him back on Monday    Florene Glen, MD, FACS

## 2015-08-04 ENCOUNTER — Telehealth: Payer: Self-pay

## 2015-08-04 MED ORDER — ONDANSETRON 4 MG PO TBDP: 4.0000 mg | ORAL_TABLET | Freq: Three times a day (TID) | ORAL | Status: DC | PRN

## 2015-08-04 NOTE — Telephone Encounter (Signed)
Called patient to check on status with new antibiotics. He states he is feeling better but Flagyl has been giving him some nausea. He has been eating prior to taking Flagyl as well. Denies vomiting and fever.  Spoke with Dr. Adonis Huguenin. He ordered Zofran ODT for patient to help ease his nausea from Flagyl.  Patient was given information about medication and was encouraged to call immediately with any fever or vomiting. He verbalizes understanding of this.

## 2015-08-04 NOTE — Telephone Encounter (Signed)
Culture results were received today but did not include sensitivities as requested. Call made to [facility] at this time to request that "Routine Flora" be identified and if their is an organism, that sensitivities need to be done on this so that we can make sure that we are ordering the correct antibiotic. She will send over an authorization form to be faxed back and go ahead with this order at this time.

## 2015-08-07 ENCOUNTER — Ambulatory Visit (INDEPENDENT_AMBULATORY_CARE_PROVIDER_SITE_OTHER): Payer: Managed Care, Other (non HMO) | Admitting: Surgery

## 2015-08-07 ENCOUNTER — Encounter: Payer: Self-pay | Admitting: Surgery

## 2015-08-07 VITALS — BP 161/103 | HR 87 | Temp 98.7°F | Ht 70.0 in | Wt 244.8 lb

## 2015-08-07 DIAGNOSIS — L732 Hidradenitis suppurativa: Secondary | ICD-10-CM | POA: Insufficient documentation

## 2015-08-07 DIAGNOSIS — Z720 Tobacco use: Secondary | ICD-10-CM

## 2015-08-07 HISTORY — DX: Hidradenitis suppurativa: L73.2

## 2015-08-07 MED ORDER — DOXYCYCLINE HYCLATE 50 MG PO CAPS: 50.0000 mg | ORAL_CAPSULE | Freq: Two times a day (BID) | ORAL | Status: DC

## 2015-08-07 MED ORDER — MUPIROCIN CALCIUM 2 % EX CREA
1.0000 | TOPICAL_CREAM | Freq: Two times a day (BID) | CUTANEOUS | Status: DC
Start: 2015-08-07 — End: 2015-11-08

## 2015-08-07 MED ORDER — MUPIROCIN CALCIUM 2 % EX CREA: 1.0000 "application " | TOPICAL_CREAM | Freq: Two times a day (BID) | CUTANEOUS | Status: DC

## 2015-08-07 MED ORDER — VARENICLINE TARTRATE 0.5 MG X 11 & 1 MG X 42 PO MISC: ORAL | Status: DC

## 2015-08-07 NOTE — Patient Instructions (Signed)
Please soak in warm soapy water daily to help drain this area.   Your antibiotic cream and oral antibiotic has been sent to pharmacy.

## 2015-08-07 NOTE — Progress Notes (Signed)
45 year old male with history of hidradenitis in the groin. He has had right groin hidradenitis with abscess formation that has been lanced previously by my partners. This started back in December and he is continent continued to have abscesses in this area that of fluctuated back to being inflamed after improving. Patient has now been on Cipro and Flagyl and seems to now be improving in the area.  Filed Vitals:   08/07/15 1723 08/07/15 1724  BP: 154/100 161/103  Pulse:  87  Temp:  98.7 F (37.1 C)   PE:  Gen:NAD Right groin: hyperemic skin, no induration or erythema mild supporative drainage in the area, soft, multiple pits seen in other areas of the groin  Axilla: mulitple pits seen in bilateral axilla, non with erythema or active drainage.   A/P:   Patient with hidradenitis of the right groin that previously had abscess formation. I've started him on doxycycline as a maintenance antibiotic and given him a prescription for mupirocin antibiotic ointment over the area. Have instructed him to continued using warm compresses whenever they are draining the supportive material,  Or to use warm baths soaks.   Patient is obviously frustrated over the length of time that this has gone on. Described to him what hidradenitis was and that this is the nature of the disease.  Also discussed with him that there may be some laser treatments available to him through plastic surgeons at [facility] having wide local excision of skin with grafting.  Will have patient f/u in 4 week for wound check. He knows to call if there are issues or if he feels the area is worsening.    Also discussed with him smoking cessation medical review very important for him to stop this to help with the healing process as well as for overall health patient was agreeable to this I discussed using medications for this versus patches and gum. Patient would like to try medication I prescribed him Chantix and discussed with him that about 7-10  days after starting this medication that he should pick a quit date and stop smoking at that time. Also discussed with him that one of the potential side effects was nightmares and hallucinations or sleep walking at night. Discussed with him that if he starts having these symptoms and wants to try different medication to give [place] a call.    Greater than 15 minutes discussing smoking cessation with the patient.

## 2015-08-11 ENCOUNTER — Telehealth: Payer: Self-pay

## 2015-08-11 NOTE — Telephone Encounter (Signed)
Called to give patient results to Wound Culture. Explained that organism was found to be resistant to Bactrim that we previously had given patient but he should get much better on the Doxycycline as organism is sensitive to Doxycycline group. Patient states that he is already feeling better since beginning this antibiotic.  Encouraged to call with any questions or concerns.

## 2015-08-29 ENCOUNTER — Other Ambulatory Visit: Payer: Self-pay | Admitting: Surgery

## 2015-08-29 MED ORDER — DOXYCYCLINE HYCLATE 50 MG PO CAPS: 50.0000 mg | ORAL_CAPSULE | Freq: Two times a day (BID) | ORAL | Status: DC

## 2015-08-29 NOTE — Telephone Encounter (Signed)
Refill of Doxycycline called in to pharmacy with 3 refills at this time. Confirmed follow-up appointment with Dr. Azalee Course in 2 weeks. Encouraged patient that if this gets worse over the weekend or next week to let our office know immediately. Also explained he needs to be cleaning this area at least daily and placing Bactroban ointment on this twice daily. He states that he has been doing this once every other day because it was so expensive. Informed patient that he needs to use this medication as prescribed. He verbalizes understanding.

## 2015-08-29 NOTE — Telephone Encounter (Signed)
Patient would like to speak with the nurse about getting a refill of his antibiotic - he thinks it is Doxycycline. Patient finished his antibiotic a week ago and since then the abscess is returning to his groin area. He states while he was on the antibiotic it had cleared up so if he could take it again, and maybe increase the dosage, it would go away. Please call and advise.  (patient was last seen in the office on 08/07/15 with Dr Azalee Course for Suppurative hidradenitis)

## 2015-09-10 ENCOUNTER — Ambulatory Visit (INDEPENDENT_AMBULATORY_CARE_PROVIDER_SITE_OTHER): Payer: Managed Care, Other (non HMO) | Admitting: Surgery

## 2015-09-10 ENCOUNTER — Encounter: Payer: Self-pay | Admitting: Surgery

## 2015-09-10 VITALS — BP 159/98 | HR 73 | Temp 98.3°F | Wt 248.0 lb

## 2015-09-10 DIAGNOSIS — L02415 Cutaneous abscess of right lower limb: Secondary | ICD-10-CM

## 2015-09-10 DIAGNOSIS — L732 Hidradenitis suppurativa: Secondary | ICD-10-CM

## 2015-09-10 MED ORDER — OXYCODONE-ACETAMINOPHEN 5-325 MG PO TABS: 1.0000 | ORAL_TABLET | ORAL | Status: DC | PRN

## 2015-09-10 MED ORDER — DOXYCYCLINE HYCLATE 100 MG PO TABS: 100.0000 mg | ORAL_TABLET | Freq: Two times a day (BID) | ORAL | Status: DC

## 2015-09-10 MED ORDER — BUPROPION HCL ER (SR) 150 MG PO TB12: 150.0000 mg | ORAL_TABLET | Freq: Two times a day (BID) | ORAL | Status: DC

## 2015-09-10 NOTE — Patient Instructions (Signed)
Hidradenitis Suppurativa Hidradenitis suppurativa is a long-term (chronic) skin disease that starts with blocked sweat glands or hair follicles. Bacteria may grow in these blocked openings of your skin. Hidradenitis suppurativa is like a severe form of acne that develops in areas of your body where acne would be unusual. It is most likely to affect the areas of your body where skin rubs against skin and becomes moist. This includes your:  Underarms.  Groin.  Genital areas.  Buttocks.  Upper thighs.  Breasts. Hidradenitis suppurativa may start out with small pimples. The pimples can develop into deep sores that break open (rupture) and drain pus. Over time your skin may thicken and become scarred. Hidradenitis suppurativa cannot be passed from person to person.  CAUSES  The exact cause of hidradenitis suppurativa is not known. This condition may be due to:  Male and male hormones. The condition is rare before and after puberty.  An overactive body defense system (immune system). Your immune system may overreact to the blocked hair follicles or sweat glands and cause swelling and pus-filled sores. RISK FACTORS You may have a higher risk of hidradenitis suppurativa if you:  Are a woman.  Are between ages 47 and 29.  Have a family history of hidradenitis suppurativa.  Have a personal history of acne.  Are overweight.  Smoke.  Take the drug lithium. SIGNS AND SYMPTOMS  The first signs of an outbreak are usually painful skin bumps that look like pimples. As the condition progresses:  Skin bumps may get bigger and grow deeper into the skin.  Bumps under the skin may rupture and drain smelly pus.  Skin may become itchy and infected.  Skin may thicken and scar.  Drainage may continue through tunnels under the skin (fistulas).  Walking and moving your arms can become painful. DIAGNOSIS  Your health care provider may diagnose hidradenitis suppurativa based on your medical  history and your signs and symptoms. A physical exam will also be done. You may need to see a health care provider who specializes in skin diseases (dermatologist). You may also have tests done to confirm the diagnosis. These can include:  Swabbing a sample of pus or drainage from your skin so it can be sent to the lab and tested for infection.  Blood tests to check for infection. TREATMENT  The same treatment will not work for everybody with hidradenitis suppurativa. Your treatment will depend on how severe your symptoms are. You may need to try several treatments to find what works best for you. Part of your treatment may include cleaning and bandaging (dressing) your wounds. You may also have to take medicines, such as the following:  Antibiotics.  Acne medicines.  Medicines to block or suppress the immune system.  A diabetes medicine (metformin) is sometimes used to treat this condition.  For women, birth control pills can sometimes help relieve symptoms. You may need surgery if you have a severe case of hidradenitis suppurativa that does not respond to medicine. Surgery may involve:   Using a laser to clear the skin and remove hair follicles.  Opening and draining deep sores.  Removing the areas of skin that are diseased and scarred. HOME CARE INSTRUCTIONS  Learn as much as you can about your disease, and work closely with your health care providers.  Take medicines only as directed by your health care provider.  If you were prescribed an antibiotic medicine, finish it all even if you start to feel better.  If you are  overweight, losing weight may be very helpful. Try to reach and maintain a healthy weight.  Do not use any tobacco products, including cigarettes, chewing tobacco, or electronic cigarettes. If you need help quitting, ask your health care provider.  Do not shave the areas where you get hidradenitis suppurativa.  Do not wear deodorant.  Wear loose-fitting  clothes.  Try not to overheat and get sweaty.  Take a daily bleach bath as directed by your health care provider.  Fill your bathtub halfway with water.  Pour in  cup of unscented household bleach.  Soak for 5-10 minutes.  Cover sore areas with a warm, clean washcloth (compress) for 5-10 minutes. SEEK MEDICAL CARE IF:   You have a flare-up of hidradenitis suppurativa.  You have chills or a fever.  You are having trouble controlling your symptoms at home.   This information is not intended to replace advice given to you by your health care provider. Make sure you discuss any questions you have with your health care provider.   Document Released: 01/27/2004 Document Revised: 07/05/2014 Document Reviewed: 09/14/2013 Elsevier Interactive Patient Education [employer].

## 2015-09-10 NOTE — Progress Notes (Signed)
45 year old male with a known history of hidradenitis including one in the right groin that has been inflamed recently. Patient had been on doxycycline until about 2 weeks ago which it did well for about a week and then started to flareup appears restarted on the doxycycline then. Patient states that has been getting better since that time he denies any fever or chills and states that is draining the same supportive type material. Patient attempted to get his Chantix prescription filled however is going to cost him $600 and so in the meantime he has been able to cut back to one pack a day from 2 packs.  Filed Vitals:   09/10/15 1632  BP: 159/98  Pulse: 73  Temp: 98.3 F (36.8 C)   PE:  Gen: NAD Res: CTAB/L  Cardio: RRR Abd: obese, soft, nt Groin: right side with 2cm area of inflammed pit with some supporative expression, no cellulitus or erythema, hundreds of pits that are not inflammed can be seen throughout bilateral sides of the groin, with some scar tissue form previous excision.   A/P: 45 yr old male with supporative hidradenitis, will continue on maintaince doxycycline as well as mupricin cream as needed.  He is continue warm compresses and warm bath soaks in the area as well.  Refill of percocet given for pai nin the groin area as well   Smoking cessation:  He would like to quit and is interested, but the chantix cost $600, will Rx Wellbutrin 163m daily to see if will improve his chances of quitting smoking.    Will have him f/u in 4 weeks for wound check and see how smoking cessation going at that time.

## 2015-10-05 ENCOUNTER — Other Ambulatory Visit: Payer: Self-pay

## 2015-10-06 ENCOUNTER — Telehealth: Payer: Self-pay

## 2015-10-06 ENCOUNTER — Ambulatory Visit: Payer: Self-pay | Admitting: Surgery

## 2015-10-06 NOTE — Telephone Encounter (Signed)
Patient cancelled appointment for today due to GI illness.  However, states that this buttocks and groin are doing well. No problems at this time. Encouraged to call if he begins having difficulty. Verbalizes understanding.

## 2015-10-08 ENCOUNTER — Ambulatory Visit: Payer: Self-pay | Admitting: Surgery

## 2015-11-08 ENCOUNTER — Ambulatory Visit
Admission: EM | Admit: 2015-11-08 | Discharge: 2015-11-08 | Disposition: A | Discharge status: Home / Self Care | Payer: Managed Care, Other (non HMO) | Attending: Family Medicine | Admitting: Family Medicine

## 2015-11-08 ENCOUNTER — Encounter: Payer: Self-pay | Admitting: *Deleted

## 2015-11-08 DIAGNOSIS — J203 Acute bronchitis due to coxsackievirus: Secondary | ICD-10-CM | POA: Diagnosis not present

## 2015-11-08 DIAGNOSIS — J0100 Acute maxillary sinusitis, unspecified: Secondary | ICD-10-CM | POA: Diagnosis not present

## 2015-11-08 DIAGNOSIS — H6593 Unspecified nonsuppurative otitis media, bilateral: Secondary | ICD-10-CM | POA: Diagnosis not present

## 2015-11-08 MED ORDER — AMOXICILLIN-POT CLAVULANATE 875-125 MG PO TABS: 1.0000 | ORAL_TABLET | Freq: Two times a day (BID) | ORAL | Status: DC

## 2015-11-08 MED ORDER — ACETAMINOPHEN 500 MG PO TABS: 1000.0000 mg | ORAL_TABLET | Freq: Four times a day (QID) | ORAL | Status: AC | PRN

## 2015-11-08 MED ORDER — SALINE SPRAY 0.65 % NA SOLN
2.0000 | NASAL | Status: DC
Start: 2015-11-08 — End: 2016-03-09

## 2015-11-08 MED ORDER — DM-GUAIFENESIN ER 30-600 MG PO TB12: 1.0000 | ORAL_TABLET | Freq: Two times a day (BID) | ORAL | Status: AC

## 2015-11-08 MED ORDER — FLUTICASONE PROPIONATE 50 MCG/ACT NA SUSP: 1.0000 | Freq: Two times a day (BID) | NASAL | Status: DC

## 2015-11-08 NOTE — Discharge Instructions (Signed)
Acute Bronchitis Bronchitis is inflammation of the airways that extend from the windpipe into the lungs (bronchi). The inflammation often causes mucus to develop. This leads to a cough, which is the most common symptom of bronchitis.  In acute bronchitis, the condition usually develops suddenly and goes away over time, usually in a couple weeks. Smoking, allergies, and asthma can make bronchitis worse. Repeated episodes of bronchitis may cause further lung problems.  CAUSES Acute bronchitis is most often caused by the same virus that causes a cold. The virus can spread from person to person (contagious) through coughing, sneezing, and touching contaminated objects. SIGNS AND SYMPTOMS   Cough.   Fever.   Coughing up mucus.   Body aches.   Chest congestion.   Chills.   Shortness of breath.   Sore throat.  DIAGNOSIS  Acute bronchitis is usually diagnosed through a physical exam. Your health care provider will also ask you questions about your medical history. Tests, such as chest X-rays, are sometimes done to rule out other conditions.  TREATMENT  Acute bronchitis usually goes away in a couple weeks. Oftentimes, no medical treatment is necessary. Medicines are sometimes given for relief of fever or cough. Antibiotic medicines are usually not needed but may be prescribed in certain situations. In some cases, an inhaler may be recommended to help reduce shortness of breath and control the cough. A cool mist vaporizer may also be used to help thin bronchial secretions and make it easier to clear the chest.  HOME CARE INSTRUCTIONS  Get plenty of rest.   Drink enough fluids to keep your urine clear or pale yellow (unless you have a medical condition that requires fluid restriction). Increasing fluids may help thin your respiratory secretions (sputum) and reduce chest congestion, and it will prevent dehydration.   Take medicines only as directed by your health care provider.  If  you were prescribed an antibiotic medicine, finish it all even if you start to feel better.  Avoid smoking and secondhand smoke. Exposure to cigarette smoke or irritating chemicals will make bronchitis worse. If you are a smoker, consider using nicotine gum or skin patches to help control withdrawal symptoms. Quitting smoking will help your lungs heal faster.   Reduce the chances of another bout of acute bronchitis by washing your hands frequently, avoiding people with cold symptoms, and trying not to touch your hands to your mouth, nose, or eyes.   Keep all follow-up visits as directed by your health care provider.  SEEK MEDICAL CARE IF: Your symptoms do not improve after 1 week of treatment.  SEEK IMMEDIATE MEDICAL CARE IF:  You develop an increased fever or chills.   You have chest pain.   You have severe shortness of breath.  You have bloody sputum.   You develop dehydration.  You faint or repeatedly feel like you are going to pass out.  You develop repeated vomiting.  You develop a severe headache. MAKE SURE YOU:   Understand these instructions.  Will watch your condition.  Will get help right away if you are not doing well or get worse.   This information is not intended to replace advice given to you by your health care provider. Make sure you discuss any questions you have with your health care provider.   Document Released: 07/22/2004 Document Revised: 07/05/2014 Document Reviewed: 12/05/2012 Elsevier Interactive Patient Education 2016 [employer] Sinusitis, Adult Sinusitis is redness, soreness, and inflammation of the paranasal sinuses. Paranasal sinuses are air pockets within the  bones of your face. They are located beneath your eyes, in the middle of your forehead, and above your eyes. In healthy paranasal sinuses, mucus is able to drain out, and air is able to circulate through them by way of your nose. However, when your paranasal sinuses are inflamed,  mucus and air can become trapped. This can allow bacteria and other germs to grow and cause infection. Sinusitis can develop quickly and last only a short time (acute) or continue over a long period (chronic). Sinusitis that lasts for more than 12 weeks is considered chronic. CAUSES Causes of sinusitis include:  Allergies.  Structural abnormalities, such as displacement of the cartilage that separates your nostrils (deviated septum), which can decrease the air flow through your nose and sinuses and affect sinus drainage.  Functional abnormalities, such as when the small hairs (cilia) that line your sinuses and help remove mucus do not work properly or are not present. SIGNS AND SYMPTOMS Symptoms of acute and chronic sinusitis are the same. The primary symptoms are pain and pressure around the affected sinuses. Other symptoms include:  Upper toothache.  Earache.  Headache.  Bad breath.  Decreased sense of smell and taste.  A cough, which worsens when you are lying flat.  Fatigue.  Fever.  Thick drainage from your nose, which often is green and may contain pus (purulent).  Swelling and warmth over the affected sinuses. DIAGNOSIS Your health care provider will perform a physical exam. During your exam, your health care provider may perform any of the following to help determine if you have acute sinusitis or chronic sinusitis:  Look in your nose for signs of abnormal growths in your nostrils (nasal polyps).  Tap over the affected sinus to check for signs of infection.  View the inside of your sinuses using an imaging device that has a light attached (endoscope). If your health care provider suspects that you have chronic sinusitis, one or more of the following tests may be recommended:  Allergy tests.  Nasal culture. A sample of mucus is taken from your nose, sent to a lab, and screened for bacteria.  Nasal cytology. A sample of mucus is taken from your nose and examined by  your health care provider to determine if your sinusitis is related to an allergy. TREATMENT Most cases of acute sinusitis are related to a viral infection and will resolve on their own within 10 days. Sometimes, medicines are prescribed to help relieve symptoms of both acute and chronic sinusitis. These may include pain medicines, decongestants, nasal steroid sprays, or saline sprays. However, for sinusitis related to a bacterial infection, your health care provider will prescribe antibiotic medicines. These are medicines that will help kill the bacteria causing the infection. Rarely, sinusitis is caused by a fungal infection. In these cases, your health care provider will prescribe antifungal medicine. For some cases of chronic sinusitis, surgery is needed. Generally, these are cases in which sinusitis recurs more than 3 times per year, despite other treatments. HOME CARE INSTRUCTIONS  Drink plenty of water. Water helps thin the mucus so your sinuses can drain more easily.  Use a humidifier.  Inhale steam 3-4 times a day (for example, sit in the bathroom with the shower running).  Apply a warm, moist washcloth to your face 3-4 times a day, or as directed by your health care provider.  Use saline nasal sprays to help moisten and clean your sinuses.  Take medicines only as directed by your health care provider.  If  you were prescribed either an antibiotic or antifungal medicine, finish it all even if you start to feel better. SEEK IMMEDIATE MEDICAL CARE IF:  You have increasing pain or severe headaches.  You have nausea, vomiting, or drowsiness.  You have swelling around your face.  You have vision problems.  You have a stiff neck.  You have difficulty breathing.   This information is not intended to replace advice given to you by your health care provider. Make sure you discuss any questions you have with your health care provider.   Document Released: 06/14/2005 Document  Revised: 07/05/2014 Document Reviewed: 06/29/2011 Elsevier Interactive Patient Education 2016 [place]. Otitis Media With Effusion Otitis media with effusion is the presence of fluid in the middle ear. This is a common problem in children, which often follows ear infections. It may be present for weeks or longer after the infection. Unlike an acute ear infection, otitis media with effusion refers only to fluid behind the ear drum and not infection. Children with repeated ear and sinus infections and allergy problems are the most likely to get otitis media with effusion. CAUSES  The most frequent cause of the fluid buildup is dysfunction of the eustachian tubes. These are the tubes that drain fluid in the ears to the back of the nose (nasopharynx). SYMPTOMS   The main symptom of this condition is hearing loss. As a result, you or your child may:  Listen to the TV at a loud volume.  Not respond to questions.  Ask "what" often when spoken to.  Mistake or confuse one sound or word for another.  There may be a sensation of fullness or pressure but usually not pain. DIAGNOSIS   Your health care provider will diagnose this condition by examining you or your child's ears.  Your health care provider may test the pressure in you or your child's ear with a tympanometer.  A hearing test may be conducted if the problem persists. TREATMENT   Treatment depends on the duration and the effects of the effusion.  Antibiotics, decongestants, nose drops, and cortisone-type drugs (tablets or nasal spray) may not be helpful.  Children with persistent ear effusions may have delayed language or behavioral problems. Children at risk for developmental delays in hearing, learning, and speech may require referral to a specialist earlier than children not at risk.  You or your child's health care provider may suggest a referral to an ear, nose, and throat surgeon for treatment. The following may help  restore normal hearing:  Drainage of fluid.  Placement of ear tubes (tympanostomy tubes).  Removal of adenoids (adenoidectomy). HOME CARE INSTRUCTIONS   Avoid secondhand smoke.  Infants who are breastfed are less likely to have this condition.  Avoid feeding infants while they are lying flat.  Avoid known environmental allergens.  Avoid people who are sick. SEEK MEDICAL CARE IF:   Hearing is not better in 3 months.  Hearing is worse.  Ear pain.  Drainage from the ear.  Dizziness. MAKE SURE YOU:   Understand these instructions.  Will watch your condition.  Will get help right away if you are not doing well or get worse.   This information is not intended to replace advice given to you by your health care provider. Make sure you discuss any questions you have with your health care provider.   Document Released: 07/22/2004 Document Revised: 07/05/2014 Document Reviewed: 01/09/2013 Elsevier Interactive Patient Education [employer].

## 2015-11-08 NOTE — ED Provider Notes (Signed)
CSN: [id number]     Arrival date & time 11/08/15  5093 History   First MD Initiated Contact with Patient 11/08/15 1021     Chief Complaint  Patient presents with  . Cough   (Consider location/radiation/quality/duration/timing/severity/associated sxs/prior Treatment) HPI Comments: Single caucasian male here for evaluation post nasal drip, sore throat, ear pain, headache, productive cough started Tuesday (5 days ago suddenly).  Fever yesterday taking advil cough and cold and alkaseltzer cough and cold OTC prn not helping.  "Feel like I was run over by a truck"  Patient is a 45 y.o. male presenting with cough. The history is provided by the patient.  Cough Cough characteristics:  Productive Sputum characteristics:  Clear Severity:  Moderate Onset quality:  Sudden Duration:  5 days Timing:  Intermittent Progression:  Waxing and waning Chronicity:  New Smoker: yes   Context: upper respiratory infection, weather changes and with activity   Context: not animal exposure, not exposure to allergens, not fumes, not occupational exposure, not sick contacts and not smoke exposure   Relieved by:  Nothing Worsened by:  Deep breathing, environmental changes, activity and lying down Ineffective treatments:  Decongestant, fluids and cough suppressants Associated symptoms: ear fullness, ear pain, fever, headaches, myalgias, rhinorrhea, sinus congestion and sore throat   Associated symptoms: no chest pain, no chills, no diaphoresis, no eye discharge, no rash, no shortness of breath, no weight loss and no wheezing   Risk factors: no chemical exposure, no recent infection and no recent travel     Past Medical History  Diagnosis Date  . Bronchitis   . GERD (gastroesophageal reflux disease) 06/16/2015  . Hydradenitis     Left leg   Past Surgical History  Procedure Laterality Date  . Knee surgery Left   . Incision and drainage perirectal abscess Right 06/16/2015    Procedure: IRRIGATION AND  DEBRIDEMENT right inner thigh ABSCESS;  Surgeon: Florene Glen, MD;  Location: [facility] ORS;  Service: General;  Laterality: Right;   Family History  Problem Relation Age of Onset  . Heart disease Father 37  . Cancer Father     Lung Cancer   Social History  Substance Use Topics  . Smoking status: Current Every Day Smoker -- 1.00 packs/day    Types: Cigarettes  . Smokeless tobacco: Never Used  . Alcohol Use: No    Review of Systems  Constitutional: Positive for fever. Negative for chills, weight loss, diaphoresis, activity change, appetite change, fatigue and unexpected weight change.  HENT: Positive for congestion, ear pain, postnasal drip, rhinorrhea, sinus pressure, sore throat and voice change. Negative for dental problem, drooling, ear discharge, facial swelling, hearing loss, mouth sores, nosebleeds, sneezing, tinnitus and trouble swallowing.   Eyes: Negative for photophobia, pain, discharge, redness, itching and visual disturbance.  Respiratory: Positive for cough. Negative for choking, chest tightness, shortness of breath, wheezing and stridor.   Cardiovascular: Negative for chest pain, palpitations and leg swelling.  Gastrointestinal: Negative for nausea, vomiting, abdominal pain, diarrhea, constipation, blood in stool and abdominal distention.  Endocrine: Negative for cold intolerance and heat intolerance.  Genitourinary: Negative for dysuria.  Musculoskeletal: Positive for myalgias. Negative for back pain, joint swelling, arthralgias, gait problem, neck pain and neck stiffness.  Skin: Negative for color change, pallor, rash and wound.  Allergic/Immunologic: Positive for environmental allergies. Negative for food allergies and immunocompromised state.  Neurological: Positive for headaches. Negative for dizziness, tremors, seizures, syncope, facial asymmetry, speech difficulty, weakness, light-headedness and numbness.  Hematological: Negative for adenopathy. Does  not bruise/bleed  easily.  Psychiatric/Behavioral: Positive for sleep disturbance. Negative for behavioral problems, confusion and agitation.    Allergies  Review of patient's allergies indicates no known allergies.  Home Medications   Prior to Admission medications   Medication Sig Start Date End Date Taking? Authorizing Provider  acetaminophen (TYLENOL) 500 MG tablet Take 2 tablets (1,000 mg total) by mouth every 6 (six) hours as needed for mild pain, moderate pain or headache. 11/08/15 11/11/15  Olen Cordial, NP  buPROPion (WELLBUTRIN SR) 150 MG 12 hr tablet Take 1 tablet (150 mg total) by mouth 2 (two) times daily. 09/10/15   Hubbard Robinson, MD  dextromethorphan-guaiFENesin (MUCINEX DM) 30-600 MG 12hr tablet Take 1 tablet by mouth 2 (two) times daily. 11/08/15 11/15/15  Olen Cordial, NP  fluticasone (FLONASE) 50 MCG/ACT nasal spray Place 1 spray into both nostrils 2 (two) times daily. 11/08/15   Olen Cordial, NP  sodium chloride (OCEAN) 0.65 % SOLN nasal spray Place 2 sprays into both nostrils every 2 (two) hours while awake. 11/08/15   Olen Cordial, NP   Meds Ordered and Administered this Visit  Medications - No data to display  BP 155/89 mmHg  Pulse 77  Temp(Src) 97.8 F (36.6 C) (Oral)  Ht 5' 10"  (1.778 m)  Wt 250 lb (113.399 kg)  BMI 35.87 kg/m2  SpO2 99% No data found.   Physical Exam  Constitutional: He is oriented to person, place, and time. Vital signs are normal. He appears well-developed and well-nourished. He is active and cooperative.  Non-toxic appearance. He does not have a sickly appearance. He appears ill. No distress.  HENT:  Head: Normocephalic and atraumatic.  Right Ear: Hearing, external ear and ear canal normal. A middle ear effusion is present.  Left Ear: Hearing, external ear and ear canal normal. A middle ear effusion is present.  Nose: Mucosal edema and rhinorrhea present. No nose lacerations, sinus tenderness, nasal deformity, septal deviation or  nasal septal hematoma. No epistaxis.  No foreign bodies. Right sinus exhibits maxillary sinus tenderness and frontal sinus tenderness. Left sinus exhibits maxillary sinus tenderness and frontal sinus tenderness.  Mouth/Throat: Uvula is midline and mucous membranes are normal. Mucous membranes are not pale, not dry and not cyanotic. He does not have dentures. No oral lesions. No trismus in the jaw. Normal dentition. No dental abscesses, uvula swelling, lacerations or dental caries. Posterior oropharyngeal edema and posterior oropharyngeal erythema present. No oropharyngeal exudate or tonsillar abscesses.  Oropharynx papular/erythematous rash; cobblestoning posterior pharynx; bilateral nasal turbinates edema/erythema yellow white discharge; nasal voice/hoarse; bilateral TMs with air fluid level clear; bilateral allergic shiners  Eyes: Conjunctivae, EOM and lids are normal. Pupils are equal, round, and reactive to light. Right eye exhibits no chemosis, no discharge, no exudate and no hordeolum. No foreign body present in the right eye. Left eye exhibits no chemosis, no discharge, no exudate and no hordeolum. No foreign body present in the left eye. Right conjunctiva is not injected. Right conjunctiva has no hemorrhage. Left conjunctiva is not injected. Left conjunctiva has no hemorrhage. No scleral icterus. Right eye exhibits normal extraocular motion and no nystagmus. Left eye exhibits normal extraocular motion and no nystagmus. Right pupil is round and reactive. Left pupil is round and reactive. Pupils are equal.  Neck: Trachea normal and normal range of motion. Neck supple. No tracheal tenderness, no spinous process tenderness and no muscular tenderness present. No rigidity. No tracheal deviation, no edema, no erythema and normal range of  motion present. No thyroid mass and no thyromegaly present.  Cardiovascular: Normal rate, regular rhythm, S1 normal, S2 normal, normal heart sounds and intact distal pulses.   PMI is not displaced.  Exam reveals no gallop and no friction rub.   No murmur heard. Pulmonary/Chest: Effort normal and breath sounds normal. No stridor. No respiratory distress. He has no decreased breath sounds. He has no wheezes. He has no rhonchi. He has no rales.  Abdominal: Soft. He exhibits no distension.  Musculoskeletal: Normal range of motion. He exhibits no edema or tenderness.       Right shoulder: Normal.       Left shoulder: Normal.       Right elbow: Normal.      Left elbow: Normal.       Right hip: Normal.       Left hip: Normal.       Right knee: Normal.       Left knee: Normal.       Cervical back: Normal.       Right hand: Normal.       Left hand: Normal.  Lymphadenopathy:       Head (right side): No submental, no submandibular, no tonsillar, no preauricular, no posterior auricular and no occipital adenopathy present.       Head (left side): No submental, no submandibular, no tonsillar, no preauricular, no posterior auricular and no occipital adenopathy present.    He has no cervical adenopathy.       Right cervical: No superficial cervical, no deep cervical and no posterior cervical adenopathy present.      Left cervical: No superficial cervical, no deep cervical and no posterior cervical adenopathy present.  Neurological: He is alert and oriented to person, place, and time. He displays no atrophy and no tremor. No cranial nerve deficit or sensory deficit. He exhibits normal muscle tone. He displays no seizure activity. Coordination and gait normal. GCS eye subscore is 4. GCS verbal subscore is 5. GCS motor subscore is 6.  Skin: Skin is warm, dry and intact. No abrasion, no bruising, no burn, no ecchymosis, no laceration, no lesion, no petechiae and no rash noted. He is not diaphoretic. No cyanosis or erythema. No pallor. Nails show no clubbing.  Psychiatric: He has a normal mood and affect. His speech is normal and behavior is normal. Judgment and thought content normal.  Cognition and memory are normal.  Nursing note and vitals reviewed.   ED Course  Procedures (including critical care time)  Labs Review Labs Reviewed - No data to display  Imaging Review No results found.     MDM   1. Acute maxillary sinusitis, recurrence not specified   2. Otitis media with effusion, bilateral   3. Acute bronchitis due to coxsackievirus   Supportive treatment.   No evidence of invasive bacterial infection, non toxic and well hydrated.  This is most likely self limiting viral infection.  I do not see where any further testing or imaging is necessary at this time.   I will suggest supportive care, rest, good hygiene and encourage the patient to take adequate fluids.  The patient is to return to clinic or EMERGENCY ROOM if symptoms worsen or change significantly e.g. ear pain, fever, purulent discharge from ears or bleeding.  Exitcare handout on otitis media with effusion given to patient.  Patient verbalized agreement and understanding of treatment plan.     Suspect Viral illness: no evidence of invasive bacterial infection, non toxic and  well hydrated.  This is most likely self limiting viral infection.  I do not see where any further testing or imaging is necessary at this time.   I will suggest supportive care, rest, good hygiene and encourage the patient to take adequate fluids.  Does not require work excuse. flonase 1 spray each nostril BID prn, nasal saline 1-2 sprays each nostril prn q2h, tylenol 1030m QID prn.  Discussed honey with lemon and salt water gargles for comfort also.  The patient is to return to clinic or EMERGENCY ROOM if symptoms worsen or change significantly e.g. fever, lethargy, SOB, wheezing.  Exitcare handout on viral illness given to patient.  Patient verbalized agreement and understanding of treatment plan.    No evidence of systemic bacterial infection, non toxic and well hydrated.  I do not see where any further testing or imaging is necessary  at this time.   I will suggest supportive care, rest, good hygiene and encourage the patient to take adequate fluids.  The patient is to return to clinic or EMERGENCY ROOM if symptoms worsen or change significantly.  Exitcare handout on sinusitis given to patient.  Patient verbalized agreement and understanding of treatment plan and had no further questions at this time.   P2:  Hand washing and cover cough  start flonase 1 spray each nostril BID, saline 2 sprays each nostril q2h prn congestion.  If no improvement with 48 hours of saline and flonase use start augmentin 8721mpo BID x 10 days.  Rx given.  No evidence of systemic bacterial infection, non toxic and well hydrated.  I do not see where any further testing or imaging is necessary at this time.   I will suggest supportive care, rest, good hygiene and encourage the patient to take adequate fluids.  The patient is to return to clinic or EMERGENCY ROOM if symptoms worsen or change significantly.  Exitcare handout on sinusitis given to patient.  Patient verbalized agreement and understanding of treatment plan and had no further questions at this time.   P2:  Hand washing and cover cough  mucinex DM po BID x 7-10 days  Rx given.  Augmentin Rx for sinusitis will also cover for bronchitis.  Honey with lemon, humidifier, may use nyquil/robitussin or delsym otc prn cough also.  Patient refused Rx tussionex/cheratussion/tessalon pearles at this time.  Cough lozenges po q2h prn cough.  Bronchitis simple, community acquired, may have started as viral (probably respiratory syncytial, parainfluenza, influenza, or adenovirus), but now evidence of acute purulent bronchitis with resultant bronchial edema and mucus formation.  Viruses are the most common cause of bronchial inflammation in otherwise healthy adults with acute bronchitis.  The appearance of sputum is not predictive of whether a bacterial infection is present.  Purulent sputum is most often caused by viral  infections.  There are a small portion of those caused by non-viral agents being Mycoplamsa pneumonia.  Microscopic examination or C&S of sputum in the healthy adult with acute bronchitis is generally not helpful (usually negative or normal respiratory flora) other considerations being cough from upper respiratory tract infections, sinusitis or allergic syndromes (mild asthma or viral pneumonia).  Differential Diagnosis:  reactive airway disease (asthma, allergic aspergillosis (eosinophilia), chronic bronchitis, respiratory infection (Sinusitis, Common cold, pneumonia), congestive heart failure, reflux esophagitis, bronchogenic tumor, aspiration syndromes and/or exposure irritants/tobacco smoke.  In this case, there is no evidence of any invasive bacterial illness.  Most likely viral etiology so will hold on antibiotic treatment.  Advise supportive care  with rest, encourage fluids, good hygiene and watch for any worsening symptoms.  If they were to develop:  come back to the office or go to the emergency room if after hours. Without high fever, severe dyspnea, lack of physical findings or other risk factors, I will hold on a chest radiograph and CBC at this time. I discussed that approximately 50% of patients with acute bronchitis have a cough that lasts up to three weeks, and 25% for over a month.  Tylenol, one to two tablets every four hours as needed for fever or myalgias.   No aspirin.  Patient instructed to follow up in one week or sooner if symptoms worsen. Patient verbalized agreement and understanding of treatment plan.  P2:  hand washing and cover cough   Discussed augmentin 836m po BID for sinusitis will also cover for otitis media/bronchitis and strep throat.  Tylenol 10065mpo QID prn fever/pain.  Usually no specific medical treatment is needed if a virus is causing the sore throat.  The throat most often gets better on its own within 5 to 7 days.  Antibiotic medicine does not cure viral pharyngitis.    For acute pharyngitis caused by bacteria, your healthcare provider will prescribe an antibiotic.  . Marland Kitcheno not smoke.  . Marland Kitchenvoid secondhand smoke and other air pollutants.  . Use a cool mist humidifier to add moisture to the air.  . Get plenty of rest.  . You may want to rest your throat by talking less and eating a diet that is mostly liquid or soft for a day or two.   . Marland Kitchenonprescription throat lozenges and mouthwashes should help relieve the soreness.   . Gargling with warm saltwater and drinking warm liquids may help.  (You can make a saltwater solution by adding 1/4 teaspoon of salt to 8 ounces, or 240 mL, of warm water.)  . A nonprescription pain reliever such as aspirin, acetaminophen, or ibuprofen may ease general aches and pains.   FOLLOW UP with clinic provider if no improvements in the next 7-10 days.  Patient verbalized understanding of instructions and agreed with plan of care. P2:  Hand washing and diet.    TiOlen CordialNP 11/08/15 1040

## 2015-11-08 NOTE — ED Notes (Signed)
Pt states that he has cough and nasal congestion that started 5 days ago.

## 2016-03-08 ENCOUNTER — Telehealth: Payer: Self-pay

## 2016-03-08 NOTE — Telephone Encounter (Signed)
Patient stated he is having continuous drainage from the hydradenitis. He seemed to be aggravated that the Hydradenitis has not cleared up. Patient was added to Dr.Pabon schedule  03/09/16 @ 3:30.

## 2016-03-08 NOTE — Telephone Encounter (Signed)
Please call pt regarding his rt thigh hidradenitis. He was seen back in 09/10/15. He stated he needs to speak with a nurse about this issue. He stated this has never really gone away. He has been taking the Doxycycline daily as prescribed with no improvement. He also stated he go bronchitis a while back and they gave him Augmentin for 10 days and it worked for the hidradenitis but after about 2 weeks it returned. He would like a call back to discuss. [phone].

## 2016-03-09 ENCOUNTER — Encounter: Payer: Self-pay | Admitting: Surgery

## 2016-03-09 ENCOUNTER — Ambulatory Visit (INDEPENDENT_AMBULATORY_CARE_PROVIDER_SITE_OTHER): Payer: Managed Care, Other (non HMO) | Admitting: Surgery

## 2016-03-09 VITALS — BP 170/98 | HR 65 | Temp 98.3°F | Ht 70.0 in | Wt 249.0 lb

## 2016-03-09 DIAGNOSIS — L732 Hidradenitis suppurativa: Secondary | ICD-10-CM | POA: Diagnosis not present

## 2016-03-09 MED ORDER — TRAMADOL-ACETAMINOPHEN 37.5-325 MG PO TABS: 1.0000 | ORAL_TABLET | ORAL | 0 refills | Status: DC | PRN

## 2016-03-09 MED ORDER — AMOXICILLIN-POT CLAVULANATE 875-125 MG PO TABS: 1.0000 | ORAL_TABLET | Freq: Two times a day (BID) | ORAL | 0 refills | Status: DC

## 2016-03-09 MED ORDER — BUPROPION HCL ER (XL) 150 MG PO TB24: 150.0000 mg | ORAL_TABLET | Freq: Every day | ORAL | 1 refills | Status: DC

## 2016-03-09 NOTE — Patient Instructions (Signed)
Please pick up your antibiotics and your Wellbutrin at the pharmacy.  We will see you back in 3 weeks as scheduled below.  Please call with any questions or concerns.

## 2016-03-10 NOTE — Progress Notes (Signed)
Outpatient Surgical Follow Up  03/10/2016  Bruce Graham is an 45 y.o. male.   Chief Complaint  Patient presents with  . Follow-up    Hydradenitis    HPI:  45 year old male with history of hidradenitis in the  Right groin. He has had right groin hidradenitis with abscess formation that has been lI/Danced previously by my partners. Has been over 10 months or so and he has been placed on multiple antibiotics including Augmentin and doxycycline. He continues to have intermittent drainage and moderate sharp pain on the right groin and asked to see with some milky discharge. He continues to smoke about half a pack a day. Currently he denies any fevers, any chills or any signs of sepsis. He reports that his pain is moderate in intensity and is sharp in nature.    Past Medical History:  Diagnosis Date  . Bronchitis   . GERD (gastroesophageal reflux disease) 06/16/2015  . Hydradenitis    Left leg    Past Surgical History:  Procedure Laterality Date  . INCISION AND DRAINAGE PERIRECTAL ABSCESS Right 06/16/2015   Procedure: IRRIGATION AND DEBRIDEMENT right inner thigh ABSCESS;  Surgeon: Florene Glen, MD;  Location: [facility] OR[facility];  Service: General;  Laterality: Right;  . KNEE SURGERY Left     Family History  Problem Relation Age of Onset  . Heart disease Father 22  . Cancer Father     Lung Cancer    Social History:  reports that he has been smoking Cigarettes.  He has been smoking about 1.00 pack per day. He has never used smokeless tobacco. He reports that he does not drink alcohol or use drugs.  Allergies: No Known Allergies  Medications reviewed.    ROS Full ROS performed and is otherwise negative other than what is stated in the history of present illness   BP (!) 170/98   Pulse 65   Temp 98.3 F (36.8 C) (Oral)   Ht 5' 10"  (1.778 m)   Wt 112.9 kg (249 lb)   BMI 35.73 kg/m   Physical Exam  Constitutional: He is oriented to person, place, and time and  well-developed, well-nourished, and in no distress. No distress.  Pulmonary/Chest: Effort normal. No respiratory distress.  Abdominal: Soft. He exhibits no distension. There is no tenderness. There is no rebound.  Musculoskeletal: He exhibits no edema or deformity.  Neurological: He is alert and oriented to person, place, and time. Gait normal.  Skin: Skin is warm and dry. He is not diaphoretic.  There is an area encompassing about 10 x 5 cm on the right medial thigh with some superficial tracts consistent with hidradenitis. There is minimal milky discharge. There is no evidence of necrotizing infection or definitive abscess  Psychiatric: Mood, memory, affect and judgment normal.  Nursing note and vitals reviewed.   No results found for this or any previous visit (from the past 48 hour(s)). No results found.  Assessment/Plan: Complex and recurrent hidradenitis. At a lengthy discussion with patient regarding his disease process. I did explain to him that this is a complicated issue because he does not really have any good solutions. I have explained to him about alternative of medical management is with additional antibiotics, some hot compresses and smoking cessation. All the alternative would include excision of hidradenitis sinuses. After lengthy discussion he wishes to try and medical therapy for now. I'll prescribe him with Augmentin since this seems to be antibiotic that worsen best for him he reports that the doxycycline does  not really make a difference. I have also urged him to stop smoking and I am prescribing Wellbutrin since that help the last time with some smoking cessation. He reports that he has significant pain and I am going to give from few peels off Ultracet that is mild narcotic. I also discussed with him that this would be the one and only one prescription from me regarding narcotics. RTC in 1 month.  [place] General Surgeon  03/10/2016,8:57 AM

## 2016-04-01 ENCOUNTER — Ambulatory Visit: Payer: Self-pay | Admitting: Surgery

## 2016-08-09 ENCOUNTER — Encounter: Payer: Self-pay | Admitting: *Deleted

## 2016-08-09 ENCOUNTER — Ambulatory Visit
Admission: EM | Admit: 2016-08-09 | Discharge: 2016-08-09 | Disposition: A | Discharge status: Home / Self Care | Payer: Managed Care, Other (non HMO) | Attending: Emergency Medicine | Admitting: Emergency Medicine

## 2016-08-09 DIAGNOSIS — J0140 Acute pansinusitis, unspecified: Secondary | ICD-10-CM

## 2016-08-09 LAB — RAPID INFLUENZA A&B ANTIGENS (ARMC ONLY): INFLUENZA A (ARMC): NEGATIVE

## 2016-08-09 LAB — RAPID INFLUENZA A&B ANTIGENS: Influenza B (ARMC): NEGATIVE

## 2016-08-09 MED ORDER — HYDROCOD POLST-CPM POLST ER 10-8 MG/5ML PO SUER: 5.0000 mL | Freq: Two times a day (BID) | ORAL | 0 refills | Status: DC | PRN

## 2016-08-09 MED ORDER — FLUTICASONE PROPIONATE 50 MCG/ACT NA SUSP: 2.0000 | Freq: Every day | NASAL | 0 refills | Status: DC

## 2016-08-09 MED ORDER — DOXYCYCLINE HYCLATE 100 MG PO CAPS: 100.0000 mg | ORAL_CAPSULE | Freq: Two times a day (BID) | ORAL | 0 refills | Status: DC

## 2016-08-09 NOTE — ED Triage Notes (Signed)
Non-productive cough, fever, chest congestion and chills x2 days. Nasal discharge- green.

## 2016-08-09 NOTE — Discharge Instructions (Signed)
Take the medication as written. Start mucinex D. You may take 800 mg of motrin with 1 gram of tylenol up to 3 times a day as needed for pain. This is an effective combination for pain.  Most sinus infections are viral and do not need antibiotics unless you have a high fever, have had this for 10 days, or you get better and then get sick again. Use a neti pot or the NeilMed sinus rinse as often as you want to to reduce nasal congestion. Follow the directions on the box.   Go to www.goodrx.com to look up your medications. This will give you a list of where you can find your prescriptions at the most affordable prices.

## 2016-08-09 NOTE — ED Provider Notes (Signed)
HPI  SUBJECTIVE:  Bruce Graham is a 46 y.o. male who presents with sinus pain and pressure, and greenish brownish thick yellow nasal congestion, rhinorrhea, postnasal drip, fatigue, chills, fevers Tmax 100.9. He reports body aches, diffuse headaches and ear fullness. He denies ear pain. He reports a nonproductive cough and reports chest soreness secondary to coughing. Denies wheezing, chest pain, shortness of breath. No sore throat, neck stiffness, photophobia, rash, dental pain. He has tried Tylenol without improvement of symptoms. Symptoms are worse bending forward and lying down. He has a 20-pack-year history of smoking, no history of asthma, emphysema, COPD, diabetes, hypertension, immunocompromise, cancer, kidney disease, cardiac disease, GI bleed, kidney disease. He has a past medical history of sinusitis. He states that he had the flu in January even though he got his flu shot. States that he has been around several contacts with the flu and states that his symptoms feel similar to the flu that he had back in January. States that he can't sleep at night secondary to the cough. PMD: None    Past Medical History:  Diagnosis Date  . Bronchitis   . GERD (gastroesophageal reflux disease) 06/16/2015  . Hydradenitis    Left leg    Past Surgical History:  Procedure Laterality Date  . INCISION AND DRAINAGE PERIRECTAL ABSCESS Right 06/16/2015   Procedure: IRRIGATION AND DEBRIDEMENT right inner thigh ABSCESS;  Surgeon: Florene Glen, MD;  Location: [facility] ORS;  Service: General;  Laterality: Right;  . KNEE SURGERY Left     Family History  Problem Relation Age of Onset  . Heart disease Father 101  . Cancer Father     Lung Cancer    Social History  Substance Use Topics  . Smoking status: Current Every Day Smoker    Packs/day: 1.00    Types: Cigarettes  . Smokeless tobacco: Never Used  . Alcohol use No    No current facility-administered medications for this encounter.   Current  Outpatient Prescriptions:  .  buPROPion (WELLBUTRIN XL) 150 MG 24 hr tablet, Take 1 tablet (150 mg total) by mouth daily., Disp: 30 tablet, Rfl: 1 .  chlorpheniramine-HYDROcodone (TUSSIONEX PENNKINETIC ER) 10-8 MG/5ML SUER, Take 5 mLs by mouth every 12 (twelve) hours as needed for cough., Disp: 120 mL, Rfl: 0 .  doxycycline (VIBRAMYCIN) 100 MG capsule, Take 1 capsule (100 mg total) by mouth 2 (two) times daily. X 7 days, Disp: 14 capsule, Rfl: 0 .  fluticasone (FLONASE) 50 MCG/ACT nasal spray, Place 2 sprays into both nostrils daily., Disp: 16 g, Rfl: 0  No Known Allergies   ROS  As noted in HPI.   Physical Exam  BP 136/84 (BP Location: Left Arm)   Pulse 82   Temp 98 F (36.7 C) (Oral)   Resp 16   Ht 5' 10"  (1.778 m)   Wt 250 lb (113.4 kg)   SpO2 98%   BMI 35.87 kg/m   Constitutional: Well developed, well nourished, no acute distress Eyes:  EOMI, conjunctiva normal bilaterally HENT: Normocephalic, atraumatic,mucus membranes moist. Positive purulent nasal congestion and some dried blood in the nares. Positive maxillary and frontal sinus tenderness. Erythematous, swollen turbinates. Slightly erythematous oropharynx, no cobblestoning. No obvious postnasal drip. Neck: No cervical lymphadenopathy, meningismus Respiratory: Normal inspiratory effort, lungs clear bilaterally good air movement Cardiovascular: Normal rate regular rhythm no murmurs rubs or gallops GI: nondistended skin: No rash, skin intact Musculoskeletal: no deformities Neurologic: Alert & oriented x 3, no focal neuro deficits Psychiatric: Speech and behavior appropriate  ED Course   Medications - No data to display  Orders Placed This Encounter  Procedures  . Rapid Influenza A&B Antigens ([facility] only)    Standing Status:   Standing    Number of Occurrences:   1  . Droplet precaution    Standing Status:   Standing    Number of Occurrences:   1    Results for orders placed or performed during the hospital  encounter of 08/09/16 (from the past 24 hour(s))  Rapid Influenza A&B Antigens ([facility] only)     Status: None   Collection Time: 08/09/16  1:57 PM  Result Value Ref Range   Influenza A ([facility]) NEGATIVE NEGATIVE   Influenza B ([facility]) NEGATIVE NEGATIVE   No results found.  ED Clinical Impression  Acute pansinusitis, recurrence not specified   ED Assessment/Plan  We'll check flu this is not completely clear-cut case of flu versus sinusitis.   Flu negative. So we'll treat as a sinusitis. Flonase, Mucinex D, saline nasal irrigation, Tussionex for the cough. We'll send home with a wait-and-see prescription of doxycycline for the sinusitis. Providing primary care referral.. Discussed labs,  MDM, plan and followup with patient. Discussed sn/sx that should prompt return to the ED. Patient  agrees with plan.   Meds ordered this encounter  Medications  . doxycycline (VIBRAMYCIN) 100 MG capsule    Sig: Take 1 capsule (100 mg total) by mouth 2 (two) times daily. X 7 days    Dispense:  14 capsule    Refill:  0  . fluticasone (FLONASE) 50 MCG/ACT nasal spray    Sig: Place 2 sprays into both nostrils daily.    Dispense:  16 g    Refill:  0  . chlorpheniramine-HYDROcodone (TUSSIONEX PENNKINETIC ER) 10-8 MG/5ML SUER    Sig: Take 5 mLs by mouth every 12 (twelve) hours as needed for cough.    Dispense:  120 mL    Refill:  0    *This clinic note was created using Lobby[employer]. Therefore, there may be occasional mistakes despite careful proofreading.  ?   Melynda Ripple, MD 08/09/16 [phone]

## 2017-02-13 ENCOUNTER — Ambulatory Visit
Admission: EM | Admit: 2017-02-13 | Discharge: 2017-02-13 | Disposition: A | Discharge status: Home / Self Care | Payer: Managed Care, Other (non HMO) | Attending: Emergency Medicine | Admitting: Emergency Medicine

## 2017-02-13 DIAGNOSIS — R05 Cough: Secondary | ICD-10-CM | POA: Diagnosis not present

## 2017-02-13 DIAGNOSIS — R059 Cough, unspecified: Secondary | ICD-10-CM

## 2017-02-13 MED ORDER — FLUTICASONE PROPIONATE 50 MCG/ACT NA SUSP: 2.0000 | Freq: Every day | NASAL | 0 refills | Status: DC

## 2017-02-13 MED ORDER — PREDNISONE 10 MG (21) PO TBPK: ORAL_TABLET | ORAL | 0 refills | Status: DC

## 2017-02-13 MED ORDER — AEROCHAMBER PLUS MISC: 2 refills | Status: DC

## 2017-02-13 MED ORDER — ALBUTEROL SULFATE HFA 108 (90 BASE) MCG/ACT IN AERS: 1.0000 | INHALATION_SPRAY | Freq: Four times a day (QID) | RESPIRATORY_TRACT | 0 refills | Status: DC | PRN

## 2017-02-13 MED ORDER — HYDROCOD POLST-CPM POLST ER 10-8 MG/5ML PO SUER: 5.0000 mL | Freq: Two times a day (BID) | ORAL | 0 refills | Status: DC | PRN

## 2017-02-13 NOTE — ED Triage Notes (Addendum)
Pt with cough x past few days. Now states is coughing up yellow phlegm. "I get bronchitis a few times a year and it feels like that."

## 2017-02-13 NOTE — Discharge Instructions (Signed)
2 puffs from your albuterol inhaler every 4-6 hours as needed for coughing, wheezing. Take the cough syrup at night to help her rest. Try the Flonase as well. Return here or see your doctor if you are not getting better after finishing the steroids, or if you start having fevers

## 2017-02-13 NOTE — ED Provider Notes (Signed)
HPI  SUBJECTIVE:  Bruce Graham is a 46 y.o. male who presents with a cough for the past for 4-5 days. States it became productive yesterday of yellow phlegm. He reports nasal congestion in the morning only. He states that he is unable to sleep at night secondary to cough. He denies fevers but states that he hasn't checked. States that he took some Tylenol last night which seemed to help his symptoms but then became "sweaty" afterwards. No aggravating factors states this feels identical to previous episodes of bronchitis. He denies rhinorrhea, postnasal drip. No body aches, chills, wheezing, chest pain, shortness of breath of breath, dyspnea on exertion. No belching, burning chest pain, GERD symptoms. He has a history of GERD for which he takes Zantac but states it is not bothering him. No allergy symptoms. He took Tylenol within 6-8 hours evaluation. No antibiotics in the past month. No hemoptysis, posttussive emesis.  past medical history of bronchitis, he is a smoker, GERD. No history of asthma, emphysema, COPD, diabetes, hypertension, ACE inhibitor use. PMD: Dr. Vicente Masson     Past Medical History:  Diagnosis Date  . Bronchitis   . GERD (gastroesophageal reflux disease) 06/16/2015  . Hydradenitis    Left leg    Past Surgical History:  Procedure Laterality Date  . INCISION AND DRAINAGE PERIRECTAL ABSCESS Right 06/16/2015   Procedure: IRRIGATION AND DEBRIDEMENT right inner thigh ABSCESS;  Surgeon: Florene Glen, MD;  Location: [facility] ORS;  Service: General;  Laterality: Right;  . KNEE SURGERY Left     Family History  Problem Relation Age of Onset  . Heart disease Father 75  . Cancer Father        Lung Cancer    Social History  Substance Use Topics  . Smoking status: Current Every Day Smoker    Packs/day: 0.50    Types: Cigarettes  . Smokeless tobacco: Never Used  . Alcohol use Yes     Comment: social    No current facility-administered medications for this encounter.   Current  Outpatient Prescriptions:  .  albuterol (PROVENTIL HFA;VENTOLIN HFA) 108 (90 Base) MCG/ACT inhaler, Inhale 1-2 puffs into the lungs every 6 (six) hours as needed for wheezing or shortness of breath., Disp: 1 Inhaler, Rfl: 0 .  buPROPion (WELLBUTRIN XL) 150 MG 24 hr tablet, Take 1 tablet (150 mg total) by mouth daily., Disp: 30 tablet, Rfl: 1 .  chlorpheniramine-HYDROcodone (TUSSIONEX PENNKINETIC ER) 10-8 MG/5ML SUER, Take 5 mLs by mouth every 12 (twelve) hours as needed for cough., Disp: 120 mL, Rfl: 0 .  fluticasone (FLONASE) 50 MCG/ACT nasal spray, Place 2 sprays into both nostrils daily., Disp: 16 g, Rfl: 0 .  predniSONE (STERAPRED UNI-PAK 21 TAB) 10 MG (21) TBPK tablet, Dispense one 6 day pack. Take as directed with food., Disp: 21 tablet, Rfl: 0 .  Spacer/Aero-Holding Chambers (AEROCHAMBER PLUS) inhaler, Use as instructed, Disp: 1 each, Rfl: 2  No Known Allergies   ROS  As noted in HPI.   Physical Exam  BP (!) 158/82 (BP Location: Right Arm)   Pulse 72   Temp 98.2 F (36.8 C) (Oral)   Resp 18   Ht 5' 10"  (1.778 m)   Wt 248 lb (112.5 kg)   SpO2 99%   BMI 35.58 kg/m   Constitutional: Well developed, well nourished, no acute distress Eyes:  EOMI, conjunctiva normal bilaterally HENT: Normocephalic, atraumatic,mucus membranes moistAmount nasal congestion. No postnasal drip. Respiratory: Normal inspiratory effort lungs clear bilaterally. Good air movement. No chest  wall tenderness Cardiovascular: Normal rate regular rhythm no murmurs rubs gallops  GI: nondistended skin: No rash, skin intact Musculoskeletal: no deformities Neurologic: Alert & oriented x 3, no focal neuro deficits Psychiatric: Speech and behavior appropriate   ED Course   Medications - No data to display  No orders of the defined types were placed in this encounter.   No results found for this or any previous visit (from the past 24 hour(s)). No results found.  ED Clinical Impression  Cough   ED  Assessment/Plan  Presentation most consistent with a cough, most likely from a viral illness. Discussed with him the various etiologies of cough including postnasal drip, bronchospasm, GERD, medications in addition to bronchitis and lung infections. Discussed with patient that I do not think that he has any clear indications for antibiotics today in the absence of fever, focal lung findings, and is satting 99% on room air. Deferred imaging today as well. Discussed with him that most cases of bronchitis are viral and do not respond to antibiotics. Plan to  send home with albuterol, prednisone taper, Flonase, Tussionex. He will follow-up here or with his primary care physician if not getting better after prednisone taper we can consider antibiotics at that time.  Discussed MDM, plan and followup with patient. Patient  agrees with plan.   Meds ordered this encounter  Medications  . fluticasone (FLONASE) 50 MCG/ACT nasal spray    Sig: Place 2 sprays into both nostrils daily.    Dispense:  16 g    Refill:  0  . chlorpheniramine-HYDROcodone (TUSSIONEX PENNKINETIC ER) 10-8 MG/5ML SUER    Sig: Take 5 mLs by mouth every 12 (twelve) hours as needed for cough.    Dispense:  120 mL    Refill:  0  . albuterol (PROVENTIL HFA;VENTOLIN HFA) 108 (90 Base) MCG/ACT inhaler    Sig: Inhale 1-2 puffs into the lungs every 6 (six) hours as needed for wheezing or shortness of breath.    Dispense:  1 Inhaler    Refill:  0  . Spacer/Aero-Holding Chambers (AEROCHAMBER PLUS) inhaler    Sig: Use as instructed    Dispense:  1 each    Refill:  2  . predniSONE (STERAPRED UNI-PAK 21 TAB) 10 MG (21) TBPK tablet    Sig: Dispense one 6 day pack. Take as directed with food.    Dispense:  21 tablet    Refill:  0    *This clinic note was created using Lobbyist. Therefore, there may be occasional mistakes despite careful proofreading.  ?   Melynda Ripple, MD 02/13/17 1034

## 2017-11-14 ENCOUNTER — Ambulatory Visit: Payer: Self-pay | Admitting: Surgery

## 2017-11-22 ENCOUNTER — Ambulatory Visit: Payer: Managed Care, Other (non HMO) | Admitting: Surgery

## 2017-11-22 ENCOUNTER — Other Ambulatory Visit: Payer: Self-pay

## 2017-11-22 ENCOUNTER — Encounter: Payer: Self-pay | Admitting: Surgery

## 2017-11-22 VITALS — BP 142/108 | HR 97 | Temp 98.7°F | Wt 246.0 lb

## 2017-11-22 DIAGNOSIS — K429 Umbilical hernia without obstruction or gangrene: Secondary | ICD-10-CM | POA: Diagnosis not present

## 2017-11-22 HISTORY — DX: Umbilical hernia without obstruction or gangrene: K42.9

## 2017-11-22 NOTE — Patient Instructions (Signed)
You have requested for your Umbilical Hernia be repaired. This has been scheduled on the week of June 24-28, 2019 by Dr. Rosana Hoes at [facility].  Please see your (blue)pre-care sheet for information.  You will need to arrange to be off work for 1-2 weeks but will have to have a lifting restriction of no more than 15 lbs for 6 weeks following your surgery.   Umbilical Hernia, Adult A hernia is a bulge of tissue that pushes through an opening between muscles. An umbilical hernia happens in the abdomen, near the belly button (umbilicus). The hernia may contain tissues from the small intestine, large intestine, or fatty tissue covering the intestines (omentum). Umbilical hernias in adults tend to get worse over time, and they require surgical treatment. There are several types of umbilical hernias. You may have:  A hernia located just above or below the umbilicus (indirect hernia). This is the most common type of umbilical hernia in adults.  A hernia that forms through an opening formed by the umbilicus (direct hernia).  A hernia that comes and goes (reducible hernia). A reducible hernia may be visible only when you strain, lift something heavy, or cough. This type of hernia can be pushed back into the abdomen (reduced).  A hernia that traps abdominal tissue inside the hernia (incarcerated hernia). This type of hernia cannot be reduced.  A hernia that cuts off blood flow to the tissues inside the hernia (strangulated hernia). The tissues can start to die if this happens. This type of hernia requires emergency treatment.  What are the causes? An umbilical hernia happens when tissue inside the abdomen presses on a weak area of the abdominal muscles. What increases the risk? You may have a greater risk of this condition if you:  Are obese.  Have had several pregnancies.  Have a buildup of fluid inside your abdomen (ascites).  Have had surgery that weakens the abdominal muscles.  What  are the signs or symptoms? The main symptom of this condition is a painless bulge at or near the belly button. A reducible hernia may be visible only when you strain, lift something heavy, or cough. Other symptoms may include:  Dull pain.  A feeling of pressure.  Symptoms of a strangulated hernia may include:  Pain that gets increasingly worse.  Nausea and vomiting.  Pain when pressing on the hernia.  Skin over the hernia becoming red or purple.  Constipation.  Blood in the stool.  How is this diagnosed? This condition may be diagnosed based on:  A physical exam. You may be asked to cough or strain while standing. These actions increase the pressure inside your abdomen and force the hernia through the opening in your muscles. Your health care provider may try to reduce the hernia by pressing on it.  Your symptoms and medical history.  How is this treated? Surgery is the only treatment for an umbilical hernia. Surgery for a strangulated hernia is done as soon as possible. If you have a small hernia that is not incarcerated, you may need to lose weight before having surgery. Follow these instructions at home:  Lose weight, if told by your health care provider.  Do not try to push the hernia back in.  Watch your hernia for any changes in color or size. Tell your health care provider if any changes occur.  You may need to avoid activities that increase pressure on your hernia.  Do not lift anything that is heavier than 10 lb (4.5  kg) until your health care provider says that this is safe.  Take over-the-counter and prescription medicines only as told by your health care provider.  Keep all follow-up visits as told by your health care provider. This is important. Contact a health care provider if:  Your hernia gets larger.  Your hernia becomes painful. Get help right away if:  You develop sudden, severe pain near the area of your hernia.  You have pain as well as  nausea or vomiting.  You have pain and the skin over your hernia changes color.  You develop a fever. This information is not intended to replace advice given to you by your health care provider. Make sure you discuss any questions you have with your health care provider. Document Released: 11/14/2015 Document Revised: 02/15/2016 Document Reviewed: 11/14/2015 Elsevier Interactive Patient Education  [employer].

## 2017-11-22 NOTE — H&P (View-Only) (Signed)
Surgical Clinic History and Physical  Referring provider:  Tommy Medal, MD [address], [place] [address]  HISTORY OF PRESENT ILLNESS (HPI):  47 y.o. male presents for evaluation of an increasingly painful umbilical hernia. Patient reports it's been present for "years", but only over the past several months it has become increasingly symptomatic. 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 Though patient says he is planning to start Chantix to help him quit smoking, he continues to smoke and attributes his frequent coughing to allergies rather than to smoking.  PAST MEDICAL HISTORY (PMH):  Past Medical History:  Diagnosis Date  . Abscess of right thigh 06/16/2015  . Bronchitis   . GERD (gastroesophageal reflux disease) 06/16/2015  . Hydradenitis    Left leg     PAST SURGICAL HISTORY [facility]):  Past Surgical History:  Procedure Laterality Date  . INCISION AND DRAINAGE PERIRECTAL ABSCESS Right 06/16/2015   Procedure: IRRIGATION AND DEBRIDEMENT right inner thigh ABSCESS;  Surgeon: Florene Glen, MD;  Location: [facility] ORS;  Service: General;  Laterality: Right;  . KNEE SURGERY Left      MEDICATIONS:  Prior to Admission medications   Not on File     ALLERGIES:  No Known Allergies   SOCIAL HISTORY:  Social History   Socioeconomic History  . Marital status: Single    Spouse name: Not on file  . Number of children: Not on file  . Years of education: Not on file  . Highest education level: Not on file  Occupational History  . Not on file  Social Needs  . Financial resource strain: Not on file  . Food insecurity:    Worry: Not on file    Inability: Not on file  . Transportation needs:    Medical: Not on file    Non-medical: Not on file  Tobacco Use  . Smoking status: Current Every Day Smoker    Packs/day: 0.50    Types: Cigarettes  . Smokeless  tobacco: Never Used  Substance and Sexual Activity  . Alcohol use: Yes    Comment: social  . Drug use: No  . Sexual activity: Not on file  Lifestyle  . Physical activity:    Days per week: Not on file    Minutes per session: Not on file  . Stress: Not on file  Relationships  . Social connections:    Talks on phone: Not on file    Gets together: Not on file    Attends religious service: Not on file    Active member of club or organization: Not on file    Attends meetings of clubs or organizations: Not on file    Relationship status: Not on file  . Intimate partner violence:    Fear of current or ex partner: Not on file    Emotionally abused: Not on file    Physically abused: Not on file    Forced sexual activity: Not on file  Other Topics Concern  . Not on file  Social History Narrative  . Not on file    The patient currently resides (home / rehab facility / nursing home): Home The patient normally is (ambulatory / bedbound): Ambulatory  FAMILY HISTORY:  Family History  Problem Relation Age of Onset  . Heart disease Father 54  . Cancer Father        Lung Cancer    Otherwise  negative/non-contributory.  REVIEW OF SYSTEMS:  Constitutional: denies any other weight loss, fever, chills, or sweats  Eyes: denies any other vision changes, history of eye injury  ENT: denies sore throat, hearing problems  Respiratory: denies shortness of breath, wheezing  Cardiovascular: denies chest pain, palpitations  Gastrointestinal: abdominal pain, N/V, and bowel function as per HPI Musculoskeletal: denies any other joint pains or cramps  Skin: Denies any other rashes or skin discolorations Neurological: denies any other headache, dizziness, weakness  Psychiatric: Denies any other depression, anxiety   All other review of systems were otherwise negative   VITAL SIGNS:  BP (!) 142/108   Pulse 97   Temp 98.7 F (37.1 C) (Oral)   Wt 246 lb (111.6 kg)   BMI 35.30 kg/m   PHYSICAL  EXAM:  Constitutional:  -- Normal body habitus  -- Awake, alert, and oriented x3  Eyes:  -- Pupils equally round and reactive to light  -- No scleral icterus  Ear, nose, throat:  -- No jugular venous distension -- No nasal drainage, bleeding Pulmonary:  -- No crackles  -- Equal breath sounds bilaterally -- Breathing non-labored at rest Cardiovascular:  -- S1, S2 present  -- No pericardial rubs  Gastrointestinal:  -- Abdomen soft and non-distended with focal peri-umbilical tenderness to palpation, no guarding/rebound  -- Umbilical hernia (primarily infra-umbilical) able to be reduced with relief of pain, but not easily -- No abdominal masses appreciated, pulsatile or otherwise Musculoskeletal and Integumentary:  -- Wounds or skin discoloration: None appreciated -- Extremities: B/L UE and LE FROM, hands and feet warm, no edema  Neurologic:  -- Motor function: Intact and symmetric -- Sensation: Intact and symmetric  Labs: CBC: No results found for: WBC, RBC BMP: No results found for: GLUCOSE, POTASSIUM, CHLORIDE, CO2, BUN, CREATININE, CALCIUM   Imaging studies: No new pertinent imaging studies available for review   Assessment/Plan:  47 y.o. male with increasingly painful umbilical hernia, complicated by co-morbidities including chronic ongoing tobacco abuse, hydradenitis, GERD, and a history of Right thigh abscess.   - signs and symptoms of incarceration and obstruction discussed  - reviewed with patient strategies for manual self-reduction of umbilical hernia prn  - minimize heavy lifting, consider velcro abdominal binder for relief until and after surgical repair  - all risks, benefits, and alternatives to open repair of umbilical hernia with mesh were discussed with the patient, all of his questions were answered to his expressed satisfaction, patient expresses he wishes to proceed, and informed consent was obtained.  - will plan for elective open repair of infra-umbilical  hernia with mesh the week of June 24-28 per patient request pending OR availability  - anticipate return to clinic 2 weeks following above elective procedure  - instructed to call if any questions or concerns  - smoking cessation strongly encouraged  All of the above recommendations were discussed with the patient, and all of patient's questions were answered to his expressed satisfaction.  Thank you for the opportunity to participate in this patient's care.  -- Marilynne Drivers Rosana Hoes, MD, [place]: [facility] - Partnering for exceptional care. Office: [phone]

## 2017-11-22 NOTE — Progress Notes (Signed)
Surgical Clinic History and Physical  Referring provider:  Tommy Medal, MD [address],  [id number]  HISTORY OF PRESENT ILLNESS (HPI):  47 y.o. male presents for evaluation of an increasingly painful umbilical hernia. Patient reports it's been present for "years", but only over the past several months it has become increasingly symptomatic. 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 Though patient says he is planning to start Chantix to help him quit smoking, he continues to smoke and attributes his frequent coughing to allergies rather than to smoking.  PAST MEDICAL HISTORY (PMH):  Past Medical History:  Diagnosis Date  . Abscess of right thigh 06/16/2015  . Bronchitis   . GERD (gastroesophageal reflux disease) 06/16/2015  . Hydradenitis    Left leg     PAST SURGICAL HISTORY [facility]):  Past Surgical History:  Procedure Laterality Date  . INCISION AND DRAINAGE PERIRECTAL ABSCESS Right 06/16/2015   Procedure: IRRIGATION AND DEBRIDEMENT right inner thigh ABSCESS;  Surgeon: Florene Glen, MD;  Location: [facility] ORS;  Service: General;  Laterality: Right;  . KNEE SURGERY Left      MEDICATIONS:  Prior to Admission medications   Not on File     ALLERGIES:  No Known Allergies   SOCIAL HISTORY:  Social History   Socioeconomic History  . Marital status: Single    Spouse name: Not on file  . Number of children: Not on file  . Years of education: Not on file  . Highest education level: Not on file  Occupational History  . Not on file  Social Needs  . Financial resource strain: Not on file  . Food insecurity:    Worry: Not on file    Inability: Not on file  . Transportation needs:    Medical: Not on file    Non-medical: Not on file  Tobacco Use  . Smoking status: Current Every Day Smoker    Packs/day: 0.50    Types: Cigarettes  . Smokeless  tobacco: Never Used  Substance and Sexual Activity  . Alcohol use: Yes    Comment: social  . Drug use: No  . Sexual activity: Not on file  Lifestyle  . Physical activity:    Days per week: Not on file    Minutes per session: Not on file  . Stress: Not on file  Relationships  . Social connections:    Talks on phone: Not on file    Gets together: Not on file    Attends religious service: Not on file    Active member of club or organization: Not on file    Attends meetings of clubs or organizations: Not on file    Relationship status: Not on file  . Intimate partner violence:    Fear of current or ex partner: Not on file    Emotionally abused: Not on file    Physically abused: Not on file    Forced sexual activity: Not on file  Other Topics Concern  . Not on file  Social History Narrative  . Not on file    The patient currently resides (home / rehab facility / nursing home): Home The patient normally is (ambulatory / bedbound): Ambulatory  FAMILY HISTORY:  Family History  Problem Relation Age of Onset  . Heart disease Father 30  . Cancer Father        Lung Cancer    Otherwise  negative/non-contributory.  REVIEW OF SYSTEMS:  Constitutional: denies any other weight loss, fever, chills, or sweats  Eyes: denies any other vision changes, history of eye injury  ENT: denies sore throat, hearing problems  Respiratory: denies shortness of breath, wheezing  Cardiovascular: denies chest pain, palpitations  Gastrointestinal: abdominal pain, N/V, and bowel function as per HPI Musculoskeletal: denies any other joint pains or cramps  Skin: Denies any other rashes or skin discolorations Neurological: denies any other headache, dizziness, weakness  Psychiatric: Denies any other depression, anxiety   All other review of systems were otherwise negative   VITAL SIGNS:  BP (!) 142/108   Pulse 97   Temp 98.7 F (37.1 C) (Oral)   Wt 246 lb (111.6 kg)   BMI 35.30 kg/m   PHYSICAL  EXAM:  Constitutional:  -- Normal body habitus  -- Awake, alert, and oriented x3  Eyes:  -- Pupils equally round and reactive to light  -- No scleral icterus  Ear, nose, throat:  -- No jugular venous distension -- No nasal drainage, bleeding Pulmonary:  -- No crackles  -- Equal breath sounds bilaterally -- Breathing non-labored at rest Cardiovascular:  -- S1, S2 present  -- No pericardial rubs  Gastrointestinal:  -- Abdomen soft and non-distended with focal peri-umbilical tenderness to palpation, no guarding/rebound  -- Umbilical hernia (primarily infra-umbilical) able to be reduced with relief of pain, but not easily -- No abdominal masses appreciated, pulsatile or otherwise Musculoskeletal and Integumentary:  -- Wounds or skin discoloration: None appreciated -- Extremities: B/L UE and LE FROM, hands and feet warm, no edema  Neurologic:  -- Motor function: Intact and symmetric -- Sensation: Intact and symmetric  Labs: CBC: No results found for: WBC, RBC BMP: No results found for: GLUCOSE, POTASSIUM, CHLORIDE, CO2, BUN, CREATININE, CALCIUM   Imaging studies: No new pertinent imaging studies available for review   Assessment/Plan:  47 y.o. male with increasingly painful umbilical hernia, complicated by co-morbidities including chronic ongoing tobacco abuse, hydradenitis, GERD, and a history of Right thigh abscess.   - signs and symptoms of incarceration and obstruction discussed  - reviewed with patient strategies for manual self-reduction of umbilical hernia prn  - minimize heavy lifting, consider velcro abdominal binder for relief until and after surgical repair  - all risks, benefits, and alternatives to open repair of umbilical hernia with mesh were discussed with the patient, all of his questions were answered to his expressed satisfaction, patient expresses he wishes to proceed, and informed consent was obtained.  - will plan for elective open repair of infra-umbilical  hernia with mesh the week of June 24-28 per patient request pending OR availability  - anticipate return to clinic 2 weeks following above elective procedure  - instructed to call if any questions or concerns  - smoking cessation strongly encouraged  All of the above recommendations were discussed with the patient, and all of patient's questions were answered to his expressed satisfaction.  Thank you for the opportunity to participate in this patient's care.  -- Marilynne Drivers Rosana Hoes, MD, [place]: [facility] - Partnering for exceptional care. Office: [phone]

## 2017-11-25 ENCOUNTER — Telehealth: Payer: Self-pay | Admitting: Surgery

## 2017-11-25 NOTE — Addendum Note (Signed)
Addended by: Priscille Heidelberg on: 11/25/2017 04:36 PM   Modules accepted: Orders, SmartSet

## 2017-11-25 NOTE — Telephone Encounter (Signed)
I have called patient to discuss surgery information. No answer. I was not able to leave a message on the voicemail due to the voicemail being full.   pre op date/time and sx date. Sx: 12/20/17 with Dr Marcy Siren umbilical hernia repair.  Pre op: 12/13/17 between 9-1:00pm--phone interview.    call [phone], between 1-3:00pm the day before surgery, to find out what time to arrive.

## 2017-11-28 NOTE — Telephone Encounter (Signed)
I have contacted patient-all surgery information has been discussed. Patient was informed to call the office for any other questions.

## 2017-12-13 ENCOUNTER — Other Ambulatory Visit: Payer: Self-pay

## 2017-12-13 ENCOUNTER — Inpatient Hospital Stay: Admission: RE | Admit: 2017-12-13 | Payer: Managed Care, Other (non HMO) | Source: Ambulatory Visit

## 2017-12-13 ENCOUNTER — Encounter
Admission: RE | Admit: 2017-12-13 | Discharge: 2017-12-13 | Disposition: A | Discharge status: Home / Self Care | Payer: Managed Care, Other (non HMO) | Source: Ambulatory Visit | Attending: Surgery | Admitting: Surgery

## 2017-12-13 DIAGNOSIS — H6690 Otitis media, unspecified, unspecified ear: Secondary | ICD-10-CM

## 2017-12-13 HISTORY — DX: Otitis media, unspecified, unspecified ear: H66.90

## 2017-12-13 NOTE — Patient Instructions (Signed)
Your procedure is scheduled on: 12-20-17 TUESAY Report to Same Day Surgery 2nd floor medical mall [facility] Entrance-take elevator on left to 2nd floor.  Check in with surgery information desk.) To find out your arrival time please call [phone] between 1PM - 3PM on 12-19-17 MONDAY  Remember: Instructions that are not followed completely may result in serious medical risk, up to and including death, or upon the discretion of your surgeon and anesthesiologist your surgery may need to be rescheduled.    _x___ 1. Do not eat food after midnight the night before your procedure. NO GUM OR CANDY AFTER MIDNIGHT.  You may drink clear liquids up to 2 hours before you are scheduled to arrive at the hospital for your procedure.  Do not drink clear liquids within 2 hours of your scheduled arrival to the hospital.  Clear liquids include  --Water or Apple juice without pulp  --Clear carbohydrate beverage such as ClearFast or Gatorade  --Black Coffee or Clear Tea (No milk, no creamers, do not add anything to the coffee or Tea     __x__ 2. No Alcohol for 24 hours before or after surgery.   __x__3. No Smoking or e-cigarettes for 24 prior to surgery.  Do not use any chewable tobacco products for at least 6 hour prior to surgery   ____  4. Bring all medications with you on the day of surgery if instructed.    __x__ 5. Notify your doctor if there is any change in your medical condition     (cold, fever, infections).    x___6. On the morning of surgery brush your teeth with toothpaste and water.  You may rinse your mouth with mouth wash if you wish.  Do not swallow any toothpaste or mouthwash.   Do not wear jewelry, make-up, hairpins, clips or nail polish.  Do not wear lotions, powders, or perfumes. You may wear deodorant.  Do not shave 48 hours prior to surgery. Men may shave face and neck.  Do not bring valuables to the hospital.    [facility] is not responsible for any belongings or  valuables.               Contacts, dentures or bridgework may not be worn into surgery.  Leave your suitcase in the car. After surgery it may be brought to your room.  For patients admitted to the hospital, discharge time is determined by your treatment team.  _  Patients discharged the day of surgery will not be allowed to drive home.  You will need someone to drive you home and stay with you the night of your procedure.    Please read over the following fact sheets that you were given:   [facility] Preparing for Surgery   ____ Take anti-hypertensive listed below, cardiac, seizure, asthma, anti-reflux and psychiatric medicines. These include:  1. NONE  2.  3.  4.  5.  6.  ____Fleets enema or Magnesium Citrate as directed.   _x___ Use CHG Soap or sage wipes as directed on instruction sheet   ____ Use inhalers on the day of surgery and bring to hospital day of surgery  ____ Stop Metformin and Janumet 2 days prior to surgery.    ____ Take 1/2 of usual insulin dose the night before surgery and none on the morning surgery.   ____ Follow recommendations from Cardiologist, Pulmonologist or PCP regarding stopping Aspirin, Coumadin, Plavix ,Eliquis, Effient, or Pradaxa, and Pletal.  X____Stop Anti-inflammatories such as Advil,  Aleve, Ibuprofen, Motrin, Naproxen, Naprosyn, Goodies powders or aspirin products NOW-OK to take Tylenol    ____ Stop supplements until after surgery.     ____ Bring C-Pap to the hospital.

## 2017-12-14 ENCOUNTER — Inpatient Hospital Stay: Admission: RE | Admit: 2017-12-14 | Payer: Managed Care, Other (non HMO) | Source: Ambulatory Visit

## 2017-12-16 ENCOUNTER — Inpatient Hospital Stay: Admission: RE | Admit: 2017-12-16 | Payer: Managed Care, Other (non HMO) | Source: Ambulatory Visit

## 2017-12-19 ENCOUNTER — Encounter
Admission: RE | Admit: 2017-12-19 | Discharge: 2017-12-19 | Disposition: A | Discharge status: Home / Self Care | Payer: Managed Care, Other (non HMO) | Source: Ambulatory Visit | Attending: Surgery | Admitting: Surgery

## 2017-12-19 DIAGNOSIS — Z8249 Family history of ischemic heart disease and other diseases of the circulatory system: Secondary | ICD-10-CM | POA: Diagnosis not present

## 2017-12-19 DIAGNOSIS — L732 Hidradenitis suppurativa: Secondary | ICD-10-CM | POA: Diagnosis not present

## 2017-12-19 DIAGNOSIS — K219 Gastro-esophageal reflux disease without esophagitis: Secondary | ICD-10-CM | POA: Diagnosis not present

## 2017-12-19 DIAGNOSIS — Z801 Family history of malignant neoplasm of trachea, bronchus and lung: Secondary | ICD-10-CM | POA: Diagnosis not present

## 2017-12-19 DIAGNOSIS — F17210 Nicotine dependence, cigarettes, uncomplicated: Secondary | ICD-10-CM | POA: Diagnosis not present

## 2017-12-19 DIAGNOSIS — K429 Umbilical hernia without obstruction or gangrene: Secondary | ICD-10-CM | POA: Diagnosis present

## 2017-12-19 LAB — CBC WITH DIFFERENTIAL/PLATELET
BASOS ABS: 0.1 10*3/uL (ref 0–0.1)
Basophils Relative: 1 %
EOS ABS: 0.2 10*3/uL (ref 0–0.7)
Eosinophils Relative: 2 %
HEMATOCRIT: 44.4 % (ref 40.0–52.0)
Hemoglobin: 15.3 g/dL (ref 13.0–18.0)
Lymphocytes Relative: 40 %
Lymphs Abs: 4 10*3/uL — ABNORMAL HIGH (ref 1.0–3.6)
MCH: 29 pg (ref 26.0–34.0)
MCHC: 34.5 g/dL (ref 32.0–36.0)
MCV: 84.1 fL (ref 80.0–100.0)
MONO ABS: 0.9 10*3/uL (ref 0.2–1.0)
MONOS PCT: 8 %
NEUTROS ABS: 5 10*3/uL (ref 1.4–6.5)
Neutrophils Relative %: 49 %
PLATELETS: 387 10*3/uL (ref 150–440)
RBC: 5.28 MIL/uL (ref 4.40–5.90)
RDW: 14.2 % (ref 11.5–14.5)
WBC: 10.1 10*3/uL (ref 3.8–10.6)

## 2017-12-19 LAB — BASIC METABOLIC PANEL
ANION GAP: 10 (ref 5–15)
BUN: 11 mg/dL (ref 6–20)
CO2: 25 mmol/L (ref 22–32)
CREATININE: 1.07 mg/dL (ref 0.61–1.24)
Calcium: 9.3 mg/dL (ref 8.9–10.3)
Chloride: 101 mmol/L (ref 101–111)
GLUCOSE: 84 mg/dL (ref 65–99)
Potassium: 3.5 mmol/L (ref 3.5–5.1)
Sodium: 136 mmol/L (ref 135–145)

## 2017-12-20 ENCOUNTER — Encounter
Admission: RE | Disposition: A | Discharge status: Home / Self Care | Payer: Self-pay | Source: Ambulatory Visit | Attending: Surgery

## 2017-12-20 ENCOUNTER — Ambulatory Visit
Admission: RE | Admit: 2017-12-20 | Discharge: 2017-12-20 | Disposition: A | Discharge status: Home / Self Care | Payer: Managed Care, Other (non HMO) | Source: Ambulatory Visit | Attending: Surgery | Admitting: Surgery

## 2017-12-20 ENCOUNTER — Telehealth: Payer: Self-pay | Admitting: Surgery

## 2017-12-20 ENCOUNTER — Encounter: Payer: Self-pay | Admitting: Emergency Medicine

## 2017-12-20 ENCOUNTER — Ambulatory Visit: Payer: Managed Care, Other (non HMO) | Admitting: Certified Registered Nurse Anesthetist

## 2017-12-20 DIAGNOSIS — Z801 Family history of malignant neoplasm of trachea, bronchus and lung: Secondary | ICD-10-CM | POA: Insufficient documentation

## 2017-12-20 DIAGNOSIS — K429 Umbilical hernia without obstruction or gangrene: Secondary | ICD-10-CM | POA: Insufficient documentation

## 2017-12-20 DIAGNOSIS — K219 Gastro-esophageal reflux disease without esophagitis: Secondary | ICD-10-CM | POA: Insufficient documentation

## 2017-12-20 DIAGNOSIS — Z8249 Family history of ischemic heart disease and other diseases of the circulatory system: Secondary | ICD-10-CM | POA: Insufficient documentation

## 2017-12-20 DIAGNOSIS — F17210 Nicotine dependence, cigarettes, uncomplicated: Secondary | ICD-10-CM | POA: Insufficient documentation

## 2017-12-20 DIAGNOSIS — L732 Hidradenitis suppurativa: Secondary | ICD-10-CM | POA: Insufficient documentation

## 2017-12-20 HISTORY — PX: INSERTION OF MESH: SHX5868

## 2017-12-20 HISTORY — PX: UMBILICAL HERNIA REPAIR: SHX196

## 2017-12-20 SURGERY — REPAIR, HERNIA, UMBILICAL, ADULT
Anesthesia: General | Site: Abdomen | Wound class: Clean

## 2017-12-20 MED ORDER — DEXAMETHASONE SODIUM PHOSPHATE 10 MG/ML IJ SOLN
INTRAMUSCULAR | Status: DC | PRN
  Administered 2017-12-20: 10 mg via INTRAVENOUS

## 2017-12-20 MED ORDER — ROCURONIUM BROMIDE 50 MG/5ML IV SOLN
INTRAVENOUS | Status: AC
  Filled 2017-12-20: qty 1

## 2017-12-20 MED ORDER — HYDROMORPHONE HCL 1 MG/ML IJ SOLN
INTRAMUSCULAR | Status: AC
  Filled 2017-12-20: qty 1

## 2017-12-20 MED ORDER — CEFAZOLIN SODIUM-DEXTROSE 2-4 GM/100ML-% IV SOLN
2.0000 g | INTRAVENOUS | Status: AC
  Administered 2017-12-20: 2 g via INTRAVENOUS

## 2017-12-20 MED ORDER — GABAPENTIN 300 MG PO CAPS
ORAL_CAPSULE | ORAL | Status: AC
  Administered 2017-12-20: 300 mg via ORAL
  Filled 2017-12-20: qty 1

## 2017-12-20 MED ORDER — FENTANYL CITRATE (PF) 250 MCG/5ML IJ SOLN
INTRAMUSCULAR | Status: AC
  Filled 2017-12-20: qty 5

## 2017-12-20 MED ORDER — CHLORHEXIDINE GLUCONATE CLOTH 2 % EX PADS: 6.0000 | MEDICATED_PAD | Freq: Once | CUTANEOUS | Status: DC

## 2017-12-20 MED ORDER — LACTATED RINGERS IV SOLN
INTRAVENOUS | Status: DC
  Administered 2017-12-20: 06:00:00 via INTRAVENOUS

## 2017-12-20 MED ORDER — HYDROCODONE-ACETAMINOPHEN 7.5-325 MG PO TABS
1.0000 | ORAL_TABLET | Freq: Once | ORAL | Status: AC | PRN
  Administered 2017-12-20: 1 via ORAL

## 2017-12-20 MED ORDER — KETAMINE HCL 50 MG/ML IJ SOLN
INTRAMUSCULAR | Status: DC | PRN
  Administered 2017-12-20: 30 mg via INTRAMUSCULAR
  Administered 2017-12-20: 10 mg via INTRAMUSCULAR

## 2017-12-20 MED ORDER — SUCCINYLCHOLINE CHLORIDE 20 MG/ML IJ SOLN
INTRAMUSCULAR | Status: DC | PRN
  Administered 2017-12-20: 120 mg via INTRAVENOUS

## 2017-12-20 MED ORDER — ACETAMINOPHEN 500 MG PO TABS
ORAL_TABLET | ORAL | Status: AC
  Administered 2017-12-20: 1000 mg via ORAL
  Filled 2017-12-20: qty 2

## 2017-12-20 MED ORDER — HYDROCODONE-ACETAMINOPHEN 7.5-325 MG PO TABS
ORAL_TABLET | ORAL | Status: AC
  Filled 2017-12-20: qty 1

## 2017-12-20 MED ORDER — MIDAZOLAM HCL 2 MG/2ML IJ SOLN
INTRAMUSCULAR | Status: AC
  Filled 2017-12-20: qty 2

## 2017-12-20 MED ORDER — ALBUTEROL SULFATE HFA 108 (90 BASE) MCG/ACT IN AERS
INHALATION_SPRAY | RESPIRATORY_TRACT | Status: DC | PRN
  Administered 2017-12-20 (×2): 6 via RESPIRATORY_TRACT

## 2017-12-20 MED ORDER — FAMOTIDINE 20 MG PO TABS
ORAL_TABLET | ORAL | Status: AC
  Administered 2017-12-20: 20 mg via ORAL
  Filled 2017-12-20: qty 1

## 2017-12-20 MED ORDER — LIDOCAINE HCL (PF) 4 % IJ SOLN
INTRAMUSCULAR | Status: DC | PRN
  Administered 2017-12-20: 4 mL via RESPIRATORY_TRACT

## 2017-12-20 MED ORDER — GABAPENTIN 300 MG PO CAPS
300.0000 mg | ORAL_CAPSULE | ORAL | Status: AC
  Administered 2017-12-20: 300 mg via ORAL

## 2017-12-20 MED ORDER — MEPERIDINE HCL 50 MG/ML IJ SOLN: 6.2500 mg | INTRAMUSCULAR | Status: DC | PRN

## 2017-12-20 MED ORDER — SODIUM CHLORIDE 0.9 % IR SOLN
Status: DC | PRN
  Administered 2017-12-20: 300 mL

## 2017-12-20 MED ORDER — ACETAMINOPHEN 500 MG PO TABS
1000.0000 mg | ORAL_TABLET | ORAL | Status: AC
  Administered 2017-12-20: 1000 mg via ORAL

## 2017-12-20 MED ORDER — ONDANSETRON HCL 8 MG PO TABS: 8.0000 mg | ORAL_TABLET | Freq: Once | ORAL | 0 refills | Status: AC

## 2017-12-20 MED ORDER — SUGAMMADEX SODIUM 200 MG/2ML IV SOLN
INTRAVENOUS | Status: AC
  Filled 2017-12-20: qty 4

## 2017-12-20 MED ORDER — OXYCODONE-ACETAMINOPHEN 5-325 MG PO TABS: 1.0000 | ORAL_TABLET | ORAL | 0 refills | Status: DC | PRN

## 2017-12-20 MED ORDER — FAMOTIDINE 20 MG PO TABS
20.0000 mg | ORAL_TABLET | Freq: Once | ORAL | Status: AC
  Administered 2017-12-20: 20 mg via ORAL

## 2017-12-20 MED ORDER — MIDAZOLAM HCL 2 MG/2ML IJ SOLN
INTRAMUSCULAR | Status: DC | PRN
  Administered 2017-12-20: 2 mg via INTRAVENOUS

## 2017-12-20 MED ORDER — ONDANSETRON HCL 4 MG/2ML IJ SOLN
INTRAMUSCULAR | Status: AC
  Filled 2017-12-20: qty 2

## 2017-12-20 MED ORDER — SUGAMMADEX SODIUM 200 MG/2ML IV SOLN
INTRAVENOUS | Status: DC | PRN
  Administered 2017-12-20: 250 mg via INTRAVENOUS

## 2017-12-20 MED ORDER — ROCURONIUM BROMIDE 100 MG/10ML IV SOLN
INTRAVENOUS | Status: DC | PRN
  Administered 2017-12-20: 40 mg via INTRAVENOUS
  Administered 2017-12-20: 10 mg via INTRAVENOUS

## 2017-12-20 MED ORDER — SEVOFLURANE IN SOLN
RESPIRATORY_TRACT | Status: AC
  Filled 2017-12-20: qty 250

## 2017-12-20 MED ORDER — ALBUTEROL SULFATE HFA 108 (90 BASE) MCG/ACT IN AERS
INHALATION_SPRAY | RESPIRATORY_TRACT | Status: AC
Start: 2017-12-20 — End: ?
  Filled 2017-12-20: qty 6.7

## 2017-12-20 MED ORDER — ACETAMINOPHEN 325 MG PO TABS: 325.0000 mg | ORAL_TABLET | ORAL | Status: DC | PRN

## 2017-12-20 MED ORDER — FENTANYL CITRATE (PF) 100 MCG/2ML IJ SOLN
INTRAMUSCULAR | Status: DC | PRN
  Administered 2017-12-20: 100 ug via INTRAVENOUS

## 2017-12-20 MED ORDER — PROPOFOL 10 MG/ML IV BOLUS
INTRAVENOUS | Status: DC | PRN
  Administered 2017-12-20: 200 mg via INTRAVENOUS
  Administered 2017-12-20: 60 mg via INTRAVENOUS

## 2017-12-20 MED ORDER — PROMETHAZINE HCL 25 MG/ML IJ SOLN: 6.2500 mg | INTRAMUSCULAR | Status: DC | PRN

## 2017-12-20 MED ORDER — LIDOCAINE HCL 1 % IJ SOLN
INTRAMUSCULAR | Status: DC | PRN
  Administered 2017-12-20: 20 mL via INTRAMUSCULAR

## 2017-12-20 MED ORDER — PROPOFOL 10 MG/ML IV BOLUS
INTRAVENOUS | Status: AC
  Filled 2017-12-20: qty 40

## 2017-12-20 MED ORDER — KETAMINE HCL 50 MG/ML IJ SOLN
INTRAMUSCULAR | Status: AC
  Filled 2017-12-20: qty 10

## 2017-12-20 MED ORDER — LIDOCAINE HCL (CARDIAC) PF 100 MG/5ML IV SOSY
PREFILLED_SYRINGE | INTRAVENOUS | Status: DC | PRN
  Administered 2017-12-20: 40 mg via INTRAVENOUS

## 2017-12-20 MED ORDER — ACETAMINOPHEN 160 MG/5ML PO SOLN
325.0000 mg | ORAL | Status: DC | PRN
  Filled 2017-12-20: qty 20.3

## 2017-12-20 MED ORDER — CEFAZOLIN SODIUM-DEXTROSE 2-4 GM/100ML-% IV SOLN
INTRAVENOUS | Status: AC
  Filled 2017-12-20: qty 100

## 2017-12-20 MED ORDER — DEXAMETHASONE SODIUM PHOSPHATE 10 MG/ML IJ SOLN
INTRAMUSCULAR | Status: AC
  Filled 2017-12-20: qty 1

## 2017-12-20 MED ORDER — LIDOCAINE HCL (PF) 1 % IJ SOLN
INTRAMUSCULAR | Status: AC
  Filled 2017-12-20: qty 30

## 2017-12-20 MED ORDER — BUPIVACAINE HCL (PF) 0.5 % IJ SOLN
INTRAMUSCULAR | Status: AC
  Filled 2017-12-20: qty 30

## 2017-12-20 MED ORDER — SUCCINYLCHOLINE CHLORIDE 20 MG/ML IJ SOLN
INTRAMUSCULAR | Status: AC
  Filled 2017-12-20: qty 1

## 2017-12-20 MED ORDER — HYDROMORPHONE HCL 1 MG/ML IJ SOLN
0.2500 mg | INTRAMUSCULAR | Status: DC | PRN
  Administered 2017-12-20: 0.5 mg via INTRAVENOUS

## 2017-12-20 MED ORDER — ONDANSETRON HCL 4 MG/2ML IJ SOLN
INTRAMUSCULAR | Status: DC | PRN
  Administered 2017-12-20: 4 mg via INTRAVENOUS

## 2017-12-20 SURGICAL SUPPLY — 29 items
ADH SKN CLS APL DERMABOND .7 (GAUZE/BANDAGES/DRESSINGS) ×1
BINDER ABDOMINAL 12 ML 46-62 (SOFTGOODS) ×2 IMPLANT
CHLORAPREP W/TINT 26ML (MISCELLANEOUS) ×3 IMPLANT
DECANTER SPIKE VIAL GLASS SM (MISCELLANEOUS) ×4 IMPLANT
DERMABOND ADVANCED (GAUZE/BANDAGES/DRESSINGS) ×2
DERMABOND ADVANCED .7 DNX12 (GAUZE/BANDAGES/DRESSINGS) ×1 IMPLANT
DRAPE LAPAROTOMY 77X122 PED (DRAPES) ×3 IMPLANT
ELECT REM PT RETURN 9FT ADLT (ELECTROSURGICAL) ×3
ELECTRODE REM PT RTRN 9FT ADLT (ELECTROSURGICAL) ×1 IMPLANT
GLOVE BIO SURGEON STRL SZ7 (GLOVE) ×5 IMPLANT
GLOVE BIOGEL PI IND STRL 7.0 (GLOVE) IMPLANT
GLOVE BIOGEL PI IND STRL 7.5 (GLOVE) ×1 IMPLANT
GLOVE BIOGEL PI INDICATOR 7.0 (GLOVE) ×2
GLOVE BIOGEL PI INDICATOR 7.5 (GLOVE) ×2
GOWN STRL REUS W/ TWL LRG LVL3 (GOWN DISPOSABLE) ×1 IMPLANT
GOWN STRL REUS W/TWL LRG LVL3 (GOWN DISPOSABLE) ×6
IV SODIUM CHL 0.9% 500ML (IV SOLUTION) ×2 IMPLANT
KIT TURNOVER KIT A (KITS) ×3 IMPLANT
MESH VENTRALEX ST 1-7/10 CRC S (Mesh General) ×2 IMPLANT
NEEDLE HYPO 22GX1.5 SAFETY (NEEDLE) ×3 IMPLANT
NS IRRIG 1000ML POUR BTL (IV SOLUTION) ×1 IMPLANT
PACK BASIN MINOR ARMC (MISCELLANEOUS) ×3 IMPLANT
SUT ETHIBOND 0 MO6 C/R (SUTURE) ×3 IMPLANT
SUT MNCRL AB 4-0 PS2 18 (SUTURE) ×3 IMPLANT
SUT VIC AB 2-0 SH 27 (SUTURE) ×3
SUT VIC AB 2-0 SH 27XBRD (SUTURE) ×1 IMPLANT
SUT VIC AB 3-0 SH 27 (SUTURE) ×3
SUT VIC AB 3-0 SH 27X BRD (SUTURE) ×1 IMPLANT
SYR 10ML LL (SYRINGE) ×3 IMPLANT

## 2017-12-20 NOTE — Discharge Instructions (Addendum)
AMBULATORY SURGERY  DISCHARGE INSTRUCTIONS   1) The drugs that you were given will stay in your system until tomorrow so for the next 24 hours you should not:  A) Drive an automobile B) Make any legal decisions C) Drink any alcoholic beverage   2) You may resume regular meals tomorrow.  Today it is better to start with liquids and gradually work up to solid foods.  You may eat anything you prefer, but it is better to start with liquids, then soup and crackers, and gradually work up to solid foods.   3) Please notify your doctor immediately if you have any unusual bleeding, trouble breathing, redness and pain at the surgery site, drainage, fever, or pain not relieved by medication. 4)   5) Your post-operative visit with Dr.                                     is: Date:                        Time:    Please call to schedule your post-operative visit.  6) Additional Instructions:     In addition to included general post-operative instructions for Open umbilical hernia repair with mesh,  Please Stop Smoking!  Diet: Resume home heart healthy diet.   Activity: No heavy lifting >15 - 20 pounds (children, pets, laundry, garbage) or strenuous activity until follow-up, but light activity and walking are encouraged. Do not drive or drink alcohol if taking narcotic pain medications.  Wound care: 2 days after surgery (Thursday, 6/27), you may shower/get incision wet with soapy water and pat dry (do not rub incisions), but no baths or submerging incision underwater until follow-up.   Medications: Resume all home medications. For mild to moderate pain: acetaminophen (Tylenol) or ibuprofen/naproxen (if no kidney disease). Combining Tylenol with alcohol can substantially increase your risk of causing liver disease. Narcotic pain medications, if prescribed, can be used for severe pain, though may cause nausea, constipation, and drowsiness. Do not combine Tylenol and Percocet (or similar)  within a 6 hour period as Percocet (and similar) contain(s) Tylenol. If you do not need the narcotic pain medication, you do not need to fill the prescription.  Call office [phone]) at any time if any questions, worsening pain, fevers/chills, bleeding, drainage from incision site, or other concerns.

## 2017-12-20 NOTE — Anesthesia Post-op Follow-up Note (Signed)
Anesthesia QCDR form completed.        

## 2017-12-20 NOTE — Op Note (Signed)
SURGICAL OPERATIVE REPORT  DATE OF PROCEDURE: 12/20/2017  ATTENDING Surgeon(s): [name], MD  ANESTHESIA: GETA  PRE-OPERATIVE DIAGNOSIS: Increasingly symptomatic (painful) reducible umbilical hernia (FMB-34: K42.9)  POST-OPERATIVE DIAGNOSIS: Increasingly symptomatic (painful) reducible umbilical hernia (YZJ-09: K42.9)  PROCEDURE(S):  1.) Open repair of symptomatic (painful) umbilical hernia with mesh (cpt: 64383)  INTRAOPERATIVE FINDINGS: 1.5 cm umbilical hernia with adherent omentum and hernia sac  INTRAVENOUS FLUIDS: 800 mL crystalloid   ESTIMATED BLOOD LOSS: Minimal (<20 mL)   URINE OUTPUT: No Foley catheter  SPECIMENS: None  IMPLANTS: 4.3 cm Bard Ventralex ST ventral hernia repair mesh  DRAINS: None  COMPLICATIONS: None apparent  CONDITION AT END OF PROCEDURE: Hemodynamically stable and extubated  DISPOSITION OF PATIENT: PACU  INDICATIONS FOR PROCEDURE:  Patient is a 47 y.o. male who presented upon referral from his PMD for evaluation of patient's umbilical hernia. He denied abdominal distention, change in bowel function, or N/V and likewise denied constipation or straining with urination. He coughs frequently, but attributes this to allergies or "sinus infection" or anything except the smoking to which it is more likely attributable. He expressed that he'd like to have it repaired. All risks, benefits, and alternatives to above elective procedure were discussed with the patient, all of patient's questions were answered to his expressed satisfaction, and informed consent was obtained and documented accordingly.  DETAILS OF PROCEDURE: Patient was brought to the operating suite and appropriately identified. General anesthesia was administered along with appropriate pre-operative antibiotics, and endotracheal intubation was performed by anesthetist. In supine position, operative site was prepped and draped in the usual sterile fashion, and following a brief time  out, local anesthetic was injected inferior to the umbilicus, and a 3 cm transverse curvilinear incision was made using a #15 blade scalpel and extended deep through subcutaneous tissues using blunt dissection and electrocautery. The hernia sac was then dissected from the undersurface of the umbilical skin and stalk, and the fascial defect was cleared of all omental adhesions. A 4.3 cm Bard Ventralex ST mesh patch was selected, inserted, confirmed to approximate well to fascial edges without intervening viscera or omentum, and patch was secured without tension using Ethibond braided non-absorbable suture. The umbilicus was then invaginated using 2-0 Vicryl suture from the dermis to fascia, and the wound was irrigated using sterile saline. Overlying tissues were re-approximated in layers using buried interrupted 3-0 Vicryl suture for dermis and to re-approximate skin, which was cleaned and dried and sterile Dermabond skin glue was applied and allowed to dry.  Patient was then safely able to be extubated, awakened, and transferred to PACU for post-operative monitoring and care.  I was present for all aspects of the above procedure, and no operative complications were apparent.

## 2017-12-20 NOTE — Interval H&P Note (Signed)
History and Physical Interval Note:  12/20/2017 7:11 AM  Bruce Graham  has presented today for surgery, with the diagnosis of UMBILICAL HERNIA  The various methods of treatment have been discussed with the patient and family. After consideration of risks, benefits and other options for treatment, the patient has consented to  Procedure(s): HERNIA REPAIR UMBILICAL ADULT (N/A) INSERTION OF MESH (N/A) as a surgical intervention .  The patient's history has been reviewed, patient examined, no change in status, stable for surgery.  I have reviewed the patient's chart and labs.  Questions were answered to the patient's satisfaction.     [name]

## 2017-12-20 NOTE — Anesthesia Preprocedure Evaluation (Signed)
Anesthesia Evaluation  Patient identified by MRN, date of birth, ID band Patient awake    Reviewed: Allergy & Precautions, H&P , NPO status , reviewed documented beta blocker date and time   Airway Mallampati: III  TM Distance: >3 FB Neck ROM: full    Dental  (+) Chipped   Pulmonary Current Smoker,    Pulmonary exam normal breath sounds clear to auscultation       Cardiovascular Normal cardiovascular exam     Neuro/Psych    GI/Hepatic GERD  Controlled,  Endo/Other    Renal/GU      Musculoskeletal   Abdominal   Peds  Hematology   Anesthesia Other Findings Past Medical History: 06/16/2015: Abscess of right thigh No date: Bronchitis 12/13/2017: Ear infection     Comment:  taking amoxicillin No date: GERD (gastroesophageal reflux disease)     Comment:  tums prn No date: Hydradenitis     Comment:  Left leg  Past Surgical History: 06/16/2015: INCISION AND DRAINAGE PERIRECTAL ABSCESS; Right     Comment:  Procedure: IRRIGATION AND DEBRIDEMENT right inner thigh               ABSCESS;  Surgeon: Florene Glen, MD;  Location: [facility]               ORS;  Service: General;  Laterality: Right; No date: KNEE SURGERY; Left  BMI    Body Mass Index:  35.30 kg/m      Reproductive/Obstetrics                             Anesthesia Physical Anesthesia Plan  ASA: II  Anesthesia Plan: General   Post-op Pain Management:    Induction:   PONV Risk Score and Plan: 2 and Treatment may vary due to age or medical condition, Ondansetron and Midazolam  Airway Management Planned:   Additional Equipment:   Intra-op Plan:   Post-operative Plan:   Informed Consent: I have reviewed the patients History and Physical, chart, labs and discussed the procedure including the risks, benefits and alternatives for the proposed anesthesia with the patient or authorized representative who has indicated his/her  understanding and acceptance.   Dental Advisory Given  Plan Discussed with: CRNA  Anesthesia Plan Comments:         Anesthesia Quick Evaluation

## 2017-12-20 NOTE — Transfer of Care (Signed)
Immediate Anesthesia Transfer of Care Note  Patient: Bruce Graham  Procedure(s) Performed: HERNIA REPAIR UMBILICAL ADULT (N/A Abdomen) INSERTION OF MESH (N/A Abdomen)  Patient Location: PACU  Anesthesia Type:General  Level of Consciousness: drowsy and patient cooperative  Airway & Oxygen Therapy: Patient Spontanous Breathing and Patient connected to face mask oxygen  Post-op Assessment: Report given to RN and Post -op Vital signs reviewed and stable  Post vital signs: Reviewed and stable    Last Vitals:  Vitals Value Taken Time  BP 124/90 12/20/2017  9:17 AM  Temp 36.3 C 12/20/2017  9:17 AM  Pulse 100 12/20/2017  9:23 AM  Resp 19 12/20/2017  9:21 AM  SpO2 96 % 12/20/2017  9:23 AM  Vitals shown include unvalidated device data.  Last Pain:  Vitals:   12/20/17 0615  TempSrc: Tympanic  PainSc: 0-No pain         Complications: No apparent anesthesia complications

## 2017-12-20 NOTE — Telephone Encounter (Signed)
Patient had a episode of vomtiing and a nose bleed after he got home. Please advise if patient needs to be seen.

## 2017-12-20 NOTE — Anesthesia Procedure Notes (Signed)
Procedure Name: Intubation Date/Time: 12/20/2017 7:42 AM Performed by: ,  W, CRNA Pre-anesthesia Checklist: Patient identified, Patient being monitored, Timeout performed, Emergency Drugs available and Suction available Patient Re-evaluated:Patient Re-evaluated prior to induction Oxygen Delivery Method: Circle system utilized Preoxygenation: Pre-oxygenation with 100% oxygen Induction Type: IV induction and Cricoid Pressure applied Ventilation: Two handed mask ventilation required and Oral airway inserted - appropriate to patient size Laryngoscope Size: Mac and 4 Grade View: Grade III Tube type: Oral Tube size: 7.0 mm Number of attempts: 1 Airway Equipment and Method: Stylet and LTA kit utilized Placement Confirmation: ETT inserted through vocal cords under direct vision,  positive ETCO2 and breath sounds checked- equal and bilateral Secured at: 22 cm Tube secured with: Tape Dental Injury: Teeth and Oropharynx as per pre-operative assessment  Comments: Two person mask. Patient has a beard.       

## 2017-12-20 NOTE — Anesthesia Postprocedure Evaluation (Signed)
Anesthesia Post Note  Patient: Bruce Graham  Procedure(s) Performed: HERNIA REPAIR UMBILICAL ADULT (N/A Abdomen) INSERTION OF MESH (N/A Abdomen)  Patient location during evaluation: PACU Anesthesia Type: General Level of consciousness: awake and alert Pain management: pain level controlled Vital Signs Assessment: post-procedure vital signs reviewed and stable Respiratory status: spontaneous breathing, nonlabored ventilation and respiratory function stable Cardiovascular status: blood pressure returned to baseline and stable Postop Assessment: no apparent nausea or vomiting Anesthetic complications: no     Last Vitals:  Vitals:   12/20/17 1032 12/20/17 1048  BP: 133/85 129/78  Pulse: 64 72  Resp: 14 14  Temp: (!) 36.1 C   SpO2: 94% 94%    Last Pain:  Vitals:   12/20/17 1032  TempSrc: Tympanic  PainSc: 7                  Alizey Noren T Lavone Neri

## 2017-12-20 NOTE — Telephone Encounter (Signed)
Called patient back and he stated that when he got home he felt sick and vomited and had a nose bleed one time. He stated that after that episode he felt tired and sleepy. Therefore, he went to bed and slept. Patient stated that he continued to feel nauseated but since he had not been able to eat. I told him that I needed him to try to eat saltine crackers and Ginger Ale if that's all he was able to eat. I told him that I would send him 2 Zofran to help him with his nausea. He stated that he would tell his wife to pick up the prescription as well as the Ginger Ale and saltine crackers. I told him to stay hydrated. I also gave him recommendation on what to do if he had another nose bleed. Patient was advised to give [place] a call tomorrow if he did not feel any better or if he got worse, to please go to the ED. Patient understood and had no further.

## 2017-12-22 ENCOUNTER — Telehealth: Payer: Self-pay | Admitting: Surgery

## 2017-12-22 NOTE — Telephone Encounter (Signed)
Patients calling said he hasn't had a bowel movement since Monday night, also complains of redness around the belly button, said very sore, tender to touch, but Denies fever,chills, vomiting and nausea. Please call patient and advise.

## 2017-12-23 ENCOUNTER — Ambulatory Visit (INDEPENDENT_AMBULATORY_CARE_PROVIDER_SITE_OTHER): Payer: Managed Care, Other (non HMO) | Admitting: Surgery

## 2017-12-23 ENCOUNTER — Encounter: Payer: Self-pay | Admitting: Surgery

## 2017-12-23 VITALS — BP 145/98 | HR 68 | Temp 97.9°F | Ht 69.0 in | Wt 238.0 lb

## 2017-12-23 DIAGNOSIS — K429 Umbilical hernia without obstruction or gangrene: Secondary | ICD-10-CM

## 2017-12-23 NOTE — Telephone Encounter (Signed)
Patient showed up this morning to be seen. I then called Dr. Rosana Hoes to let him know that the patient came in to be seen by someone because when I looked at his incision, it was red and warm at touch. Patient declined having a fever, nausea and vomiting. When I called Dr. Rosana Hoes, he stated that he would come in to see him. Patient was placed in the schedule to be seen.

## 2017-12-23 NOTE — Patient Instructions (Signed)
Return as scheduled 

## 2017-12-24 ENCOUNTER — Encounter: Payer: Self-pay | Admitting: Surgery

## 2017-12-24 NOTE — Progress Notes (Signed)
Surgical Clinic Progress/Follow-up Note   HPI:  47 y.o. Male presents to clinic for early post-op follow-up 3 Days s/p open repair of his increasingly symptomatic umbilical hernia with mesh Rosana Hoes, 12/20/2017). Patient reports moderate peri-umbilical pain improving each day, but concern regarding primary infra-umbilical bruising and a small focus of supra-umbilical bruising. He denies any drainage, fever/chills, N/V, CP, or SOB and describes his pain as tolerable and controlled when he takes his pain medication as prescribed (taking 1 Percocet every 4 - 6 hours). He also says he has been wearing his abdominal binder and limiting his activities to <15 - 20 lbs lifting.  Review of Systems:  Constitutional: denies fever/chills  Respiratory: denies shortness of breath, wheezing  Cardiovascular: denies chest pain, palpitations  Gastrointestinal: abdominal pain, N/V, and bowel function as per interval history Skin: Denies any other rashes or skin discolorations except post-surgical wounds as per interval history  Vital Signs:  BP (!) 145/98   Pulse 68   Temp 97.9 F (36.6 C)   Ht 5' 9"  (1.753 m)   Wt 238 lb (108 kg)   BMI 35.15 kg/m    Physical Exam:  Constitutional:  -- Normal body habitus  -- Awake, alert, and oriented x3  Pulmonary:  -- No crackles -- Equal breath sounds bilaterally -- Breathing non-labored at rest Cardiovascular:  -- S1, S2 present  -- No pericardial rubs  Gastrointestinal:  -- Soft and non-distended with mild-/moderate- peri-umbilical tenderness to palpation with a moderately large area of infra-umbilical/supra-pubic ecchymosis and a small (~1 cm) focus of supra-umbilical ecchymosis without ecchymosis or erythema immediately surrounding his umbilicus and no peri-incisional erythema or drainage with incision well-approximated and surgical skin glue remaining intact, no recurrent hernia, no peri-incisional hematoma, and no guarding/rebound tenderness -- No  abdominal masses appreciated, pulsatile or otherwise  Musculoskeletal / Integumentary:  -- Wounds or skin discoloration: None appreciated except post-surgical incisions as described above (GI) -- Extremities: B/L UE and LE FROM, hands and feet warm, no edema   Assessment:  47 y.o. yo Male with a problem list including...  Patient Active Problem List   Diagnosis Date Noted  . Umbilical hernia without obstruction and without gangrene 11/22/2017  . 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 GERD (gastroesophageal reflux disease) 06/16/2015    presents to clinic for early post-op follow-up evaluation of infra-umbilical ecchymosis, otherwise doing well 3 Days s/p open repair of increasingly symptomatic (painful) reducible umbilical hernia with mesh Rosana Hoes, 12/20/2017).  Plan:              - diet as tolerated              - pain control as needed (minimize narcotics)  - continue to wear abdominal binder for support and relief, remove at night  - all of patient's questions regarding abdominal binder and otherwise answered             - activities with instructed restrictions over next 2 weeks + subsequent 2 - 4 weeks             - apply sunblock particularly to incisions with sun exposure to reduce pigmentation of scars             - return to clinic as scheduled, instructed to call office if any questions or concerns  All of the above recommendations were discussed with the patient, and all of patient's questions were answered to his expressed satisfaction.  -- Marilynne Drivers Rosana Hoes, MD, [place]: [facility] - Partnering for exceptional care. Office: [phone]

## 2018-01-05 ENCOUNTER — Encounter: Payer: Self-pay | Admitting: Surgery

## 2018-01-05 ENCOUNTER — Ambulatory Visit (INDEPENDENT_AMBULATORY_CARE_PROVIDER_SITE_OTHER): Payer: Managed Care, Other (non HMO) | Admitting: Surgery

## 2018-01-05 VITALS — BP 132/90 | HR 97 | Temp 97.5°F | Ht 69.0 in | Wt 241.8 lb

## 2018-01-05 DIAGNOSIS — K429 Umbilical hernia without obstruction or gangrene: Secondary | ICD-10-CM

## 2018-01-05 DIAGNOSIS — Z4889 Encounter for other specified surgical aftercare: Secondary | ICD-10-CM

## 2018-01-05 NOTE — Patient Instructions (Signed)
We will send referral to [facility] [employer]. Someone from their office will call to schedule an appointment. If you do not hear from anyone within 5-7 days please call our office and let us know.

## 2018-01-06 ENCOUNTER — Encounter: Payer: Self-pay | Admitting: Surgery

## 2018-01-06 NOTE — Progress Notes (Signed)
Surgical Clinic Progress/Follow-up Note   HPI:  47 y.o. Male presents to clinic for post-op follow-up 2 weeks s/p open repair of his increasingly symptomatic umbilical hernia with mesh Rosana Hoes, 12/20/2017). Patient reports much improved peri-incisional peri-umbilical pain and complete resolution of pre-operative hernia and pain and has been tolerating regular diet with +flatus and more normal BM's with essentially resolved initial post-surgical constipation, denies N/V, fever/chills, CP, or SOB. He also says his peri-umbilical bruising also resolved over the past week. He has not been wearing his abdominal binder.  Review of Systems:  Constitutional: denies fever/chills  Respiratory: denies shortness of breath, wheezing  Cardiovascular: denies chest pain, palpitations  Gastrointestinal: abdominal pain, N/V, and bowel function as per interval history Skin: Denies any other rashes or skin discolorations except post-surgical wounds as per interval history  Vital Signs:  BP 132/90   Pulse 97   Temp (!) 97.5 F (36.4 C) (Oral)   Ht 5' 9"  (1.753 m)   Wt 241 lb 12.8 oz (109.7 kg)   BMI 35.71 kg/m    Physical Exam:  Constitutional:  -- Obese body habitus  -- Awake, alert, and oriented x3  Pulmonary:  -- No crackles -- Equal breath sounds bilaterally -- Breathing non-labored at rest Cardiovascular:  -- S1, S2 present  -- No pericardial rubs  Gastrointestinal:  -- Soft and non-distended, with mild peri-incisional/peri-umbilical tenderness to palpation, no guarding/rebound tenderness -- Post-surgical incision well-approximated without any peri-incisional erythema or drainage -- No abdominal masses appreciated, pulsatile or otherwise  Musculoskeletal / Integumentary:  -- Wounds or skin discoloration: None appreciated except post-surgical incisions as described above (GI) -- Extremities: B/L UE and LE FROM, hands and feet warm, no edema   Assessment:  47 y.o. yo Male with a problem  list including...  Patient Active Problem List   Diagnosis Date Noted  . Umbilical hernia without obstruction and without gangrene 11/22/2017  . 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 GERD (gastroesophageal reflux disease) 06/16/2015    presents to clinic for post-op follow-up evaluation, doing well 2 weeks s/p open repair of increasingly symptomatic umbilical hernia with mesh Rosana Hoes, 12/20/2017).  Plan:              - advance diet as tolerated              - okay to submerge incisions under water (baths, swimming) prn             - no heavy lifting >40 lbs x 2 more weeks, after which may gradually resume all activities without restrictions over the following 2 weeks             - apply sunblock particularly to incisions with sun exposure to reduce pigmentation of scars  - referred to GI for encouraged screening colonoscopy per patient's request  - additional follow-up offered, patient expresses preference to follow up as needed             - instructed to call office if any questions or concerns  - patient applauded for decreased smoking  - smoking cessation again encouraged  All of the above recommendations were discussed with the patient , and all of patient's questions were answered to his expressed satisfaction.  -- Marilynne Drivers Rosana Hoes, MD, [place]: [facility] - Partnering for exceptional care. Office: [phone]

## 2018-01-13 ENCOUNTER — Ambulatory Visit
Admission: EM | Admit: 2018-01-13 | Discharge: 2018-01-13 | Disposition: A | Discharge status: Home / Self Care | Payer: Managed Care, Other (non HMO) | Attending: Family Medicine | Admitting: Family Medicine

## 2018-01-13 ENCOUNTER — Other Ambulatory Visit: Payer: Self-pay

## 2018-01-13 ENCOUNTER — Ambulatory Visit (INDEPENDENT_AMBULATORY_CARE_PROVIDER_SITE_OTHER): Payer: Managed Care, Other (non HMO)

## 2018-01-13 DIAGNOSIS — R5381 Other malaise: Secondary | ICD-10-CM

## 2018-01-13 DIAGNOSIS — R3121 Asymptomatic microscopic hematuria: Secondary | ICD-10-CM | POA: Diagnosis not present

## 2018-01-13 DIAGNOSIS — R509 Fever, unspecified: Secondary | ICD-10-CM

## 2018-01-13 DIAGNOSIS — M791 Myalgia, unspecified site: Secondary | ICD-10-CM

## 2018-01-13 LAB — CBC WITH DIFFERENTIAL/PLATELET
BASOS ABS: 0.1 10*3/uL (ref 0–0.1)
BASOS PCT: 1 %
EOS ABS: 0.2 10*3/uL (ref 0–0.7)
Eosinophils Relative: 2 %
HEMATOCRIT: 45 % (ref 40.0–52.0)
HEMOGLOBIN: 15.2 g/dL (ref 13.0–18.0)
Lymphocytes Relative: 23 %
Lymphs Abs: 2.7 10*3/uL (ref 1.0–3.6)
MCH: 28.8 pg (ref 26.0–34.0)
MCHC: 33.9 g/dL (ref 32.0–36.0)
MCV: 84.9 fL (ref 80.0–100.0)
Monocytes Absolute: 1.5 10*3/uL — ABNORMAL HIGH (ref 0.2–1.0)
Monocytes Relative: 13 %
NEUTROS ABS: 7.3 10*3/uL — AB (ref 1.4–6.5)
NEUTROS PCT: 61 %
Platelets: 316 10*3/uL (ref 150–440)
RBC: 5.29 MIL/uL (ref 4.40–5.90)
RDW: 14.2 % (ref 11.5–14.5)
WBC: 11.8 10*3/uL — ABNORMAL HIGH (ref 3.8–10.6)

## 2018-01-13 LAB — URINALYSIS, COMPLETE (UACMP) WITH MICROSCOPIC
BILIRUBIN URINE: NEGATIVE
Glucose, UA: NEGATIVE mg/dL
KETONES UR: NEGATIVE mg/dL
LEUKOCYTES UA: NEGATIVE
NITRITE: NEGATIVE
PROTEIN: NEGATIVE mg/dL
Specific Gravity, Urine: 1.01 (ref 1.005–1.030)
pH: 6.5 (ref 5.0–8.0)

## 2018-01-13 LAB — COMPREHENSIVE METABOLIC PANEL
ALBUMIN: 3.9 g/dL (ref 3.5–5.0)
ALK PHOS: 84 U/L (ref 38–126)
ALT: 19 U/L (ref 0–44)
ANION GAP: 12 (ref 5–15)
AST: 24 U/L (ref 15–41)
BUN: 10 mg/dL (ref 6–20)
CHLORIDE: 101 mmol/L (ref 98–111)
CO2: 24 mmol/L (ref 22–32)
Calcium: 8.9 mg/dL (ref 8.9–10.3)
Creatinine, Ser: 0.99 mg/dL (ref 0.61–1.24)
GFR calc non Af Amer: >60 mL/min (ref >60)
GLUCOSE: 111 mg/dL — AB (ref 70–99)
POTASSIUM: 4.8 mmol/L (ref 3.5–5.1)
SODIUM: 137 mmol/L (ref 135–145)
Total Bilirubin: 0.6 mg/dL (ref 0.3–1.2)
Total Protein: 8.4 g/dL — ABNORMAL HIGH (ref 6.5–8.1)

## 2018-01-13 MED ORDER — AMOXICILLIN-POT CLAVULANATE 875-125 MG PO TABS: 1.0000 | ORAL_TABLET | Freq: Two times a day (BID) | ORAL | 0 refills | Status: DC

## 2018-01-13 NOTE — ED Triage Notes (Signed)
Patient complains of fever that started last night. Patient states that he felt better this morning and around lunchtime he started feeling warm again, took temp was 100.9. Patient states that his body aches. Patient reports that he did have surgery (hernia repair) on 12/21/2017.

## 2018-01-13 NOTE — Discharge Instructions (Addendum)
Rest.  Fluids.  You will need follow up regarding the blood in your urine.  Continue tylenol and ibuprofen. Antibiotic as prescribed.  Take care  Dr. Lacinda Axon

## 2018-01-13 NOTE — ED Provider Notes (Signed)
[facility]  CSN: [id number] Arrival date & time: 01/13/18  1837  History   Chief Complaint Chief Complaint  Patient presents with  . Fever   HPI   47 year old male presents with fever and body aches.  Patient reports he developed fever yesterday, T-max 101.2.  He states that his fever improved with antipyretics.  However, fever returned today.  He states that he is felt hot.  He reports ongoing cough but states that this is his baseline due to smoking.  He reports body aches.  He reports some mild abdominal soreness that he has had since recent umbilical hernia repair.  No sore throat.  No upper respiratory symptoms.  No dysuria.  He does note that he has not urinated very much today.  No shortness of breath.  No chest pain.  No reported sick contacts.  No other associated symptoms.  No other complaints or concerns at this time.  Past Medical History:  Diagnosis Date  . Abscess of right thigh 06/16/2015  . Bronchitis   . Ear infection 12/13/2017   taking amoxicillin  . GERD (gastroesophageal reflux disease)    tums prn  . Hydradenitis    Left leg  . Umbilical hernia without obstruction and without gangrene 11/22/2017   Patient Active Problem List   Diagnosis Date Noted  . 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 GERD (gastroesophageal reflux disease) 06/16/2015   Past Surgical History:  Procedure Laterality Date  . INCISION AND DRAINAGE PERIRECTAL ABSCESS Right 06/16/2015   Procedure: IRRIGATION AND DEBRIDEMENT right inner thigh ABSCESS;  Surgeon: Florene Glen, MD;  Location: [facility];  Service: General;  Laterality: Right;  . INSERTION OF MESH N/A 12/20/2017   Procedure: INSERTION OF MESH;  Surgeon: [name], MD;  Location: [facility];  Service: General;  Laterality: N/A;  . KNEE SURGERY Left   . UMBILICAL HERNIA REPAIR N/A 12/20/2017   Procedure: HERNIA REPAIR UMBILICAL ADULT;  Surgeon: [name], MD;  Location: [facility];  Service: General;   Laterality: N/A;   Home Medications    Prior to Admission medications   Medication Sig Start Date End Date Taking? Authorizing Provider  calcium carbonate (TUMS - DOSED IN MG ELEMENTAL CALCIUM) 500 MG chewable tablet Chew 1 tablet by mouth as needed for indigestion or heartburn.   Yes [provider]  amoxicillin-clavulanate (AUGMENTIN) 875-125 MG tablet Take 1 tablet by mouth every 12 (twelve) hours. 01/13/18   Coral Spikes, DO    Family History Family History  Problem Relation Age of Onset  . Heart disease Father 30  . Cancer Father        Lung Cancer    Social History Social History   Tobacco Use  . Smoking status: Current Every Day Smoker    Packs/day: 0.50    Years: 30.00    Pack years: 15.00    Types: Cigarettes  . Smokeless tobacco: Never Used  Substance Use Topics  . Alcohol use: Yes    Comment: social  . Drug use: No     Allergies   Patient has no known allergies.   Review of Systems Review of Systems  Constitutional: Positive for fever.  Musculoskeletal:       Bodyaches.    Physical Exam Triage Vital Signs ED Triage Vitals  Enc Vitals Group     BP 01/13/18 1909 135/85     Pulse Rate 01/13/18 1909 92     Resp 01/13/18 1909 18  Temp 01/13/18 1909 99.2 F (37.3 C)     Temp Source 01/13/18 1909 Oral     SpO2 01/13/18 1909 100 %     Weight 01/13/18 1907 241 lb (109.3 kg)     Height 01/13/18 1907 5' 9"  (1.753 m)     Head Circumference --      Peak Flow --      Pain Score 01/13/18 1907 5     Pain Loc --      Pain Edu? --      Excl. in [place]? --    Updated Vital Signs BP 135/85 (BP Location: Left Arm)   Pulse 92   Temp 99.2 F (37.3 C) (Oral)   Resp 18   Ht 5' 9"  (1.753 m)   Wt 241 lb (109.3 kg)   SpO2 100%   BMI 35.59 kg/m   Visual Acuity Right Eye Distance:   Left Eye Distance:   Bilateral Distance:    Right Eye Near:   Left Eye Near:    Bilateral Near:     Physical Exam  Constitutional: He is oriented to person,  place, and time. He appears well-developed. No distress.  HENT:  Head: Normocephalic and atraumatic.  Mouth/Throat: Oropharynx is clear and moist.  Eyes: Conjunctivae are normal. Right eye exhibits no discharge. Left eye exhibits no discharge.  Cardiovascular: Normal rate and regular rhythm.  Pulmonary/Chest: Effort normal and breath sounds normal. He has no wheezes. He has no rales.  Abdominal: Soft. He exhibits no distension.  Patient with mild tenderness in the periumbilical region.  He has an eschar inside the umbilicus. Mild erythema.  Neurological: He is alert and oriented to person, place, and time.  Skin: Skin is warm. No rash noted.  Psychiatric: He has a normal mood and affect. His behavior is normal.  Nursing note and vitals reviewed.  UC Treatments / Results  Labs (all labs ordered are listed, but only abnormal results are displayed) Labs Reviewed  COMPREHENSIVE METABOLIC PANEL - Abnormal; Notable for the following components:      Result Value   Glucose, Bld 111 (*)    Total Protein 8.4 (*)    All other components within normal limits  URINALYSIS, COMPLETE (UACMP) WITH MICROSCOPIC - Abnormal; Notable for the following components:   Hgb urine dipstick MODERATE (*)    Bacteria, UA FEW (*)    All other components within normal limits  CBC WITH DIFFERENTIAL/PLATELET - Abnormal; Notable for the following components:   WBC 11.8 (*)    Neutro Abs 7.3 (*)    Monocytes Absolute 1.5 (*)    All other components within normal limits    EKG None  Radiology Dg Chest 2 View  Result Date: 01/13/2018 CLINICAL DATA:  Fever with malaise EXAM: CHEST - 2 VIEW COMPARISON:  September 14, 2006 FINDINGS: The lungs are clear. The heart size and pulmonary vascularity are normal. There is no evident adenopathy. There is slight degenerative change in the thoracic spine. IMPRESSION: No edema or consolidation. Electronically Signed   By: Lowella Grip III M.D.   On: 01/13/2018 19:43     Procedures Procedures (including critical care time)  Medications Ordered in UC Medications - No data to display  Initial Impression / Assessment and Plan / UC Course  I have reviewed the triage vital signs and the nursing notes.  Pertinent labs & imaging results that were available during my care of the patient were reviewed by me and considered in my medical decision making (  see chart for details).    47 year old male presents with fever and body aches.  Exam unrevealing.  Chest x-ray negative.  Labs significant for mild leukocytosis of 11.8.  Patient had hematuria on urinalysis.  Patient states that he has had hematuria in the past.  He states that he has had previous work-up which was unremarkable.  I do not feel that he has an intra-abdominal infection following recent hernia repair.  The etiology of his fever is unclear at this time.  I had a discussion with the patient about watchful waiting.  Patient requested empiric antibiotic.  Placing on empiric Augmentin given persistent fever, leukocytosis.  Follow-up as needed.  Final Clinical Impressions(s) / UC Diagnoses   Final diagnoses:  Febrile illness  Asymptomatic microscopic hematuria     Discharge Instructions     Rest.  Fluids.  You will need follow up regarding the blood in your urine.  Continue tylenol and ibuprofen. Antibiotic as prescribed.  Take care  Dr. Lacinda Axon     ED Prescriptions    Medication Sig Dispense Auth. Provider   amoxicillin-clavulanate (AUGMENTIN) 875-125 MG tablet Take 1 tablet by mouth every 12 (twelve) hours. 20 tablet Coral Spikes, DO     Controlled Substance Prescriptions [place] Controlled Substance Registry consulted? Not Applicable   Coral Spikes, DO 01/13/18 2037

## 2018-02-15 ENCOUNTER — Ambulatory Visit: Payer: Managed Care, Other (non HMO) | Admitting: Gastroenterology

## 2018-02-15 ENCOUNTER — Other Ambulatory Visit: Payer: Self-pay

## 2018-02-15 ENCOUNTER — Encounter: Payer: Self-pay | Admitting: Gastroenterology

## 2018-02-15 VITALS — BP 142/89 | HR 69 | Resp 17 | Ht 69.0 in | Wt 237.2 lb

## 2018-02-15 DIAGNOSIS — K625 Hemorrhage of anus and rectum: Secondary | ICD-10-CM

## 2018-02-15 DIAGNOSIS — K219 Gastro-esophageal reflux disease without esophagitis: Secondary | ICD-10-CM | POA: Diagnosis not present

## 2018-02-15 MED ORDER — OMEPRAZOLE 40 MG PO CPDR: 40.0000 mg | DELAYED_RELEASE_CAPSULE | Freq: Two times a day (BID) | ORAL | 0 refills | Status: DC

## 2018-02-15 NOTE — Patient Instructions (Signed)
High-Fiber Diet Fiber, also called dietary fiber, is a type of carbohydrate found in fruits, vegetables, whole grains, and beans. A high-fiber diet can have many health benefits. Your health care provider may recommend a high-fiber diet to help:  Prevent constipation. Fiber can make your bowel movements more regular.  Lower your cholesterol.  Relieve hemorrhoids, uncomplicated diverticulosis, or irritable bowel syndrome.  Prevent overeating as part of a weight-loss plan.  Prevent heart disease, type 2 diabetes, and certain cancers.  What is my plan? The recommended daily intake of fiber includes:  38 grams for men under age 62.  47 grams for men over age 1.  4 grams for women under age 75.  22 grams for women over age 74.  You can get the recommended daily intake of dietary fiber by eating a variety of fruits, vegetables, grains, and beans. Your health care provider may also recommend a fiber supplement if it is not possible to get enough fiber through your diet. What do I need to know about a high-fiber diet?  Fiber supplements have not been widely studied for their effectiveness, so it is better to get fiber through food sources.  Always check the fiber content on thenutrition facts label of any prepackaged food. Look for foods that contain at least 5 grams of fiber per serving.  Ask your dietitian if you have questions about specific foods that are related to your condition, especially if those foods are not listed in the following section.  Increase your daily fiber consumption gradually. Increasing your intake of dietary fiber too quickly may cause bloating, cramping, or gas.  Drink plenty of water. Water helps you to digest fiber. What foods can I eat? Grains Whole-grain breads. Multigrain cereal. Oats and oatmeal. Brown rice. Barley. Bulgur wheat. [place]. Bran muffins. Popcorn. Rye wafer crackers. Vegetables Sweet potatoes. Spinach. Kale. Artichokes. Cabbage.  Broccoli. Green peas. Carrots. Squash. Fruits Berries. Pears. Apples. Oranges. Avocados. Prunes and raisins. Dried figs. Meats and Other Protein Sources [place], kidney, pinto, and soy beans. Split peas. Lentils. Nuts and seeds. Dairy Fiber-fortified yogurt. Beverages Fiber-fortified soy milk. Fiber-fortified orange juice. Other Fiber bars. The items listed above may not be a complete list of recommended foods or beverages. Contact your dietitian for more options. What foods are not recommended? Grains White bread. Pasta made with refined flour. White rice. Vegetables Fried potatoes. Canned vegetables. Well-cooked vegetables. Fruits Fruit juice. Cooked, strained fruit. Meats and Other Protein Sources Fatty cuts of meat. Fried Sales executive or fried fish. Dairy Milk. Yogurt. Cream cheese. Sour cream. Beverages Soft drinks. Other Cakes and pastries. Butter and oils. The items listed above may not be a complete list of foods and beverages to avoid. Contact your dietitian for more information. What are some tips for including high-fiber foods in my diet?  Eat a wide variety of high-fiber foods.  Make sure that half of all grains consumed each day are whole grains.  Replace breads and cereals made from refined flour or white flour with whole-grain breads and cereals.  Replace white rice with brown rice, bulgur wheat, or millet.  Start the day with a breakfast that is high in fiber, such as a cereal that contains at least 5 grams of fiber per serving.  Use beans in place of meat in soups, salads, or pasta.  Eat high-fiber snacks, such as berries, raw vegetables, nuts, or popcorn. This information is not intended to replace advice given to you by your health care provider. Make sure you discuss any  questions you have with your health care provider. Document Released: 06/14/2005 Document Revised: 11/20/2015 Document Reviewed: 11/27/2013 Elsevier Interactive Patient Education  2018  [place] for Gastroesophageal Reflux Disease, Adult When you have gastroesophageal reflux disease (GERD), the foods you eat and your eating habits are very important. Choosing the right foods can help ease your discomfort. What guidelines do I need to follow?  Choose fruits, vegetables, whole grains, and low-fat dairy products.  Choose low-fat meat, fish, and poultry.  Limit fats such as oils, salad dressings, butter, nuts, and avocado.  Keep a food diary. This helps you identify foods that cause symptoms.  Avoid foods that cause symptoms. These may be different for everyone.  Eat small meals often instead of 3 large meals a day.  Eat your meals slowly, in a place where you are relaxed.  Limit fried foods.  Cook foods using methods other than frying.  Avoid drinking alcohol.  Avoid drinking large amounts of liquids with your meals.  Avoid bending over or lying down until 2-3 hours after eating. What foods are not recommended? These are some foods and drinks that may make your symptoms worse: Vegetables Tomatoes. Tomato juice. Tomato and spaghetti sauce. Chili peppers. Onion and garlic. Horseradish. Fruits Oranges, grapefruit, and lemon (fruit and juice). Meats High-fat meats, fish, and poultry. This includes hot dogs, ribs, ham, sausage, salami, and bacon. Dairy Whole milk and chocolate milk. Sour cream. Cream. Butter. Ice cream. Cream cheese. Drinks Coffee and tea. Bubbly (carbonated) drinks or energy drinks. Condiments Hot sauce. Barbecue sauce. Sweets/Desserts Chocolate and cocoa. Donuts. Peppermint and spearmint. Fats and Oils High-fat foods. This includes [place] fries and potato chips. Other Vinegar. Strong spices. This includes black pepper, white pepper, red pepper, cayenne, curry powder, cloves, ginger, and chili powder. The items listed above may not be a complete list of foods and drinks to avoid. Contact your dietitian for more  information. This information is not intended to replace advice given to you by your health care provider. Make sure you discuss any questions you have with your health care provider. Document Released: 12/14/2011 Document Revised: 11/20/2015 Document Reviewed: 04/18/2013 Elsevier Interactive Patient Education  2017 [employer].

## 2018-02-15 NOTE — Progress Notes (Signed)
Cephas Darby, MD [address]  [place]  [place], [place] [address]  Main: [phone]  Fax: [phone]    Gastroenterology Consultation  Referring Provider:     [name], MD Primary Care Physician:  Tommy Medal, MD Primary Gastroenterologist:  Dr. Cephas Darby Reason for Consultation:     Blood in stool        HPI:   Bruce Graham is a 47 y.o. male referred by Dr. Tommy Medal, MD  for consultation & management of intermittent blood in stool. He has been noticing several months history of blood on wiping, describes it as dark blood within the stool, associated bloating. He does report occasional constipation. 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 He also reports abdominal bloating. No evidence of anemia, CMP unremarkable  He used to drink 2-3 pepsi 20oz bottles, stopped them, lost about 20lbs since then  Also reports heart burn for several years, heartburn occurs both during the and night associated with burping Did not try PPI yet He denies dysphagia, regurgitation  NSAIDs: none  Antiplts/Anticoagulants/Anti thrombotics: none  GI Procedures:  Colonoscopy 17years ago, reportedly normal. This was performed when his father was found to have colon polyps He denies family history of GI malignancy He had umbilical hernia repair  Past Medical History:  Diagnosis Date  . Abscess of right thigh 06/16/2015  . Bronchitis   . Ear infection 12/13/2017   taking amoxicillin  . GERD (gastroesophageal reflux disease)    tums prn  . Hydradenitis    Left leg  . Umbilical hernia without obstruction and without gangrene 11/22/2017    Past Surgical History:  Procedure Laterality Date  . INCISION AND DRAINAGE PERIRECTAL ABSCESS Right 06/16/2015   Procedure: IRRIGATION AND DEBRIDEMENT right inner thigh ABSCESS;  Surgeon: Florene Glen, MD;  Location: [facility] ORS;  Service:  General;  Laterality: Right;  . INSERTION OF MESH N/A 12/20/2017   Procedure: INSERTION OF MESH;  Surgeon: [name], MD;  Location: [facility] ORS;  Service: General;  Laterality: N/A;  . KNEE SURGERY Left   . UMBILICAL HERNIA REPAIR N/A 12/20/2017   Procedure: HERNIA REPAIR UMBILICAL ADULT;  Surgeon: [name], MD;  Location: [facility] ORS;  Service: General;  Laterality: N/A;    Current Outpatient Medications:  .  amoxicillin-clavulanate (AUGMENTIN) 875-125 MG tablet, Take 1 tablet by mouth every 12 (twelve) hours. (Patient not taking: Reported on 02/15/2018), Disp: 20 tablet, Rfl: 0 .  calcium carbonate (TUMS - DOSED IN MG ELEMENTAL CALCIUM) 500 MG chewable tablet, Chew 1 tablet by mouth as needed for indigestion or heartburn., Disp: , Rfl:  .  omeprazole (PRILOSEC) 40 MG capsule, Take 1 capsule (40 mg total) by mouth 2 (two) times daily before a meal., Disp: 60 capsule, Rfl: 0    Family History  Problem Relation Age of Onset  . Heart disease Father 37  . Cancer Father        Lung Cancer     Social History   Tobacco Use  . Smoking status: Current Every Day Smoker    Packs/day: 0.50    Years: 30.00    Pack years: 15.00    Types: Cigarettes  . Smokeless tobacco: Never Used  Substance Use Topics  . Alcohol use: Yes    Comment: social  . Drug use: No    Allergies as of 02/15/2018  . (  No Known Allergies)    Review of Systems:    All systems reviewed and negative except where noted in HPI.   Physical Exam:  BP (!) 142/89 (BP Location: Left Arm, Patient Position: Sitting, Cuff Size: Large)   Pulse 69   Resp 17   Ht 5' 9"  (1.753 m)   Wt 237 lb 3.2 oz (107.6 kg)   BMI 35.03 kg/m  No LMP for male patient.  General:   Alert,  Well-developed, well-nourished, pleasant and cooperative in NAD Head:  Normocephalic and atraumatic. Eyes:  Sclera clear, no icterus.   Conjunctiva pink. Ears:  Normal auditory acuity. Nose:  No deformity, discharge, or lesions. Mouth:  No  deformity or lesions,oropharynx pink & moist. Neck:  Supple; no masses or thyromegaly. Lungs:  Respirations even and unlabored.  Clear throughout to auscultation.   No wheezes, crackles, or rhonchi. No acute distress. Heart:  Regular rate and rhythm; no murmurs, clicks, rubs, or gallops. Abdomen:  Normal bowel sounds. Soft, obese, non-tender and non-distended without masses, hepatosplenomegaly or hernias noted.  No guarding or rebound tenderness.   Rectal: Not performed Msk:  Symmetrical without gross deformities. Good, equal movement & strength bilaterally. Pulses:  Normal pulses noted. Extremities:  No clubbing or edema.  No cyanosis. Neurologic:  Alert and oriented x3;  grossly normal neurologically. Skin:  Intact without significant lesions or rashes. No jaundice. Lymph Nodes:  No significant cervical adenopathy. Psych:  Alert and cooperative. Normal mood and affect.  Imaging Studies: No abdominal imaging  Assessment and Plan:   Bruce Graham is a 47 y.o. Caucasian male with morbid obesity, chronic heartburn, intermittent blood in stool  Chronic heartburn: Start omeprazole 40 mg twice daily Recommend EGD to evaluate for erosive esophagitis and Barrett's  Rectal bleeding: Recommend colonoscopy for further evaluation   Follow up in 3 months   Cephas Darby, MD

## 2018-03-06 ENCOUNTER — Ambulatory Visit
Admission: RE | Admit: 2018-03-06 | Discharge: 2018-03-06 | Disposition: A | Discharge status: Home / Self Care | Payer: Managed Care, Other (non HMO) | Source: Ambulatory Visit | Attending: Gastroenterology | Admitting: Gastroenterology

## 2018-03-06 ENCOUNTER — Ambulatory Visit: Payer: Managed Care, Other (non HMO) | Admitting: Certified Registered"

## 2018-03-06 ENCOUNTER — Encounter
Admission: RE | Disposition: A | Discharge status: Home / Self Care | Payer: Self-pay | Source: Ambulatory Visit | Attending: Gastroenterology

## 2018-03-06 ENCOUNTER — Encounter: Payer: Self-pay | Admitting: *Deleted

## 2018-03-06 DIAGNOSIS — K219 Gastro-esophageal reflux disease without esophagitis: Secondary | ICD-10-CM | POA: Insufficient documentation

## 2018-03-06 DIAGNOSIS — Z8371 Family history of colonic polyps: Secondary | ICD-10-CM | POA: Diagnosis not present

## 2018-03-06 DIAGNOSIS — K449 Diaphragmatic hernia without obstruction or gangrene: Secondary | ICD-10-CM | POA: Insufficient documentation

## 2018-03-06 DIAGNOSIS — F17210 Nicotine dependence, cigarettes, uncomplicated: Secondary | ICD-10-CM | POA: Diagnosis not present

## 2018-03-06 DIAGNOSIS — K625 Hemorrhage of anus and rectum: Secondary | ICD-10-CM

## 2018-03-06 DIAGNOSIS — Z79899 Other long term (current) drug therapy: Secondary | ICD-10-CM | POA: Diagnosis not present

## 2018-03-06 DIAGNOSIS — Z800 Family history of malignant neoplasm of digestive organs: Secondary | ICD-10-CM

## 2018-03-06 DIAGNOSIS — K228 Other specified diseases of esophagus: Secondary | ICD-10-CM

## 2018-03-06 DIAGNOSIS — Z1211 Encounter for screening for malignant neoplasm of colon: Secondary | ICD-10-CM | POA: Diagnosis not present

## 2018-03-06 DIAGNOSIS — K2270 Barrett's esophagus without dysplasia: Secondary | ICD-10-CM | POA: Insufficient documentation

## 2018-03-06 DIAGNOSIS — K5730 Diverticulosis of large intestine without perforation or abscess without bleeding: Secondary | ICD-10-CM | POA: Insufficient documentation

## 2018-03-06 HISTORY — PX: COLONOSCOPY WITH PROPOFOL: SHX5780

## 2018-03-06 HISTORY — PX: ESOPHAGOGASTRODUODENOSCOPY (EGD) WITH PROPOFOL: SHX5813

## 2018-03-06 SURGERY — ESOPHAGOGASTRODUODENOSCOPY (EGD) WITH PROPOFOL
Anesthesia: General

## 2018-03-06 MED ORDER — PROPOFOL 10 MG/ML IV BOLUS
INTRAVENOUS | Status: DC | PRN
  Administered 2018-03-06 (×3): 50 mg via INTRAVENOUS

## 2018-03-06 MED ORDER — PROPOFOL 500 MG/50ML IV EMUL
INTRAVENOUS | Status: AC
  Filled 2018-03-06: qty 50

## 2018-03-06 MED ORDER — IPRATROPIUM-ALBUTEROL 0.5-2.5 (3) MG/3ML IN SOLN
3.0000 mL | Freq: Once | RESPIRATORY_TRACT | Status: AC
  Administered 2018-03-06: 3 mL via RESPIRATORY_TRACT

## 2018-03-06 MED ORDER — LIDOCAINE 2% (20 MG/ML) 5 ML SYRINGE
INTRAMUSCULAR | Status: DC | PRN
  Administered 2018-03-06: 25 mg via INTRAVENOUS

## 2018-03-06 MED ORDER — FENTANYL CITRATE (PF) 100 MCG/2ML IJ SOLN
INTRAMUSCULAR | Status: AC
  Filled 2018-03-06: qty 2

## 2018-03-06 MED ORDER — FENTANYL CITRATE (PF) 100 MCG/2ML IJ SOLN: 25.0000 ug | INTRAMUSCULAR | Status: DC | PRN

## 2018-03-06 MED ORDER — MIDAZOLAM HCL 5 MG/5ML IJ SOLN
INTRAMUSCULAR | Status: DC | PRN
  Administered 2018-03-06: 2 mg via INTRAVENOUS

## 2018-03-06 MED ORDER — ONDANSETRON HCL 4 MG/2ML IJ SOLN: 4.0000 mg | Freq: Once | INTRAMUSCULAR | Status: DC | PRN

## 2018-03-06 MED ORDER — LIDOCAINE HCL URETHRAL/MUCOSAL 2 % EX GEL
CUTANEOUS | Status: AC
  Filled 2018-03-06: qty 5

## 2018-03-06 MED ORDER — IPRATROPIUM-ALBUTEROL 0.5-2.5 (3) MG/3ML IN SOLN
RESPIRATORY_TRACT | Status: AC
  Administered 2018-03-06: 3 mL
  Filled 2018-03-06: qty 3

## 2018-03-06 MED ORDER — IPRATROPIUM-ALBUTEROL 0.5-2.5 (3) MG/3ML IN SOLN: 3.0000 mL | Freq: Four times a day (QID) | RESPIRATORY_TRACT | Status: DC

## 2018-03-06 MED ORDER — GLYCOPYRROLATE 0.2 MG/ML IJ SOLN
INTRAMUSCULAR | Status: DC | PRN
  Administered 2018-03-06: 0.2 mg via INTRAVENOUS

## 2018-03-06 MED ORDER — MIDAZOLAM HCL 2 MG/2ML IJ SOLN
INTRAMUSCULAR | Status: AC
  Filled 2018-03-06: qty 2

## 2018-03-06 MED ORDER — SODIUM CHLORIDE 0.9 % IV SOLN
INTRAVENOUS | Status: DC
  Administered 2018-03-06: 11:00:00 via INTRAVENOUS

## 2018-03-06 MED ORDER — LIDOCAINE HCL (PF) 2 % IJ SOLN
INTRAMUSCULAR | Status: AC
  Filled 2018-03-06: qty 10

## 2018-03-06 MED ORDER — IPRATROPIUM-ALBUTEROL 0.5-2.5 (3) MG/3ML IN SOLN
RESPIRATORY_TRACT | Status: AC
  Administered 2018-03-06: 3 mL via RESPIRATORY_TRACT
  Filled 2018-03-06: qty 3

## 2018-03-06 MED ORDER — GLYCOPYRROLATE 0.2 MG/ML IJ SOLN
INTRAMUSCULAR | Status: AC
  Filled 2018-03-06: qty 1

## 2018-03-06 MED ORDER — PROPOFOL 500 MG/50ML IV EMUL
INTRAVENOUS | Status: DC | PRN
  Administered 2018-03-06: 100 ug/kg/min via INTRAVENOUS

## 2018-03-06 MED ORDER — FENTANYL CITRATE (PF) 100 MCG/2ML IJ SOLN
INTRAMUSCULAR | Status: DC | PRN
  Administered 2018-03-06 (×2): 50 ug via INTRAVENOUS

## 2018-03-06 NOTE — Transfer of Care (Signed)
Immediate Anesthesia Transfer of Care Note  Patient: Bruce Graham  Procedure(s) Performed: ESOPHAGOGASTRODUODENOSCOPY (EGD) WITH PROPOFOL (N/A ) COLONOSCOPY WITH PROPOFOL (N/A )  Patient Location: Endoscopy Unit  Anesthesia Type:General  Level of Consciousness: awake and alert   Airway & Oxygen Therapy: Patient Spontanous Breathing and Patient connected to face mask oxygen  Post-op Assessment: Report given to RN and Post -op Vital signs reviewed and stable  Post vital signs: Reviewed  Last Vitals:  Vitals Value Taken Time  BP 97/82 03/06/2018 11:59 AM  Temp    Pulse 101 03/06/2018 11:59 AM  Resp 18 03/06/2018 11:59 AM  SpO2 98 % 03/06/2018 11:59 AM    Last Pain:  Vitals:   03/06/18 1042  TempSrc: Tympanic  PainSc: 0-No pain         Complications: No apparent anesthesia complications

## 2018-03-06 NOTE — Anesthesia Postprocedure Evaluation (Signed)
Anesthesia Post Note  Patient: Bruce Graham  Procedure(s) Performed: ESOPHAGOGASTRODUODENOSCOPY (EGD) WITH PROPOFOL (N/A ) COLONOSCOPY WITH PROPOFOL (N/A )  Patient location during evaluation: PACU Anesthesia Type: General Level of consciousness: awake and alert Pain management: pain level controlled Vital Signs Assessment: post-procedure vital signs reviewed and stable Respiratory status: spontaneous breathing, nonlabored ventilation, respiratory function stable and patient connected to nasal cannula oxygen Cardiovascular status: blood pressure returned to baseline and stable Postop Assessment: no apparent nausea or vomiting Anesthetic complications: no     Last Vitals:  Vitals:   03/06/18 1159 03/06/18 1201  BP: 97/82   Pulse: (!) 101 (!) (P) 101  Resp: 18 (P) 18  Temp:  (!) 36.1 C  SpO2: 98%     Last Pain:  Vitals:   03/06/18 1201  TempSrc:   PainSc: 0-No pain                 Molli Barrows

## 2018-03-06 NOTE — Op Note (Signed)
[facility] Gastroenterology Patient Name: Bruce Graham Procedure Date: 03/06/2018 11:05 AM MRN: [id number] Account #: [id number] Date of Birth: [birth year] Admit Type: Outpatient Age: 47 Room: [facility] ENDO ROOM 2 Gender: Male Note Status: Finalized Procedure:            Colonoscopy Indications:          Screening for colon cancer: Family history of                        colorectal cancer in distant relative(s) before age 22,                        Colon cancer screening in patient at increased risk:                        Family history of 1st-degree relative with colon polyps                        before age 57 years Providers:            Emelie Newsom Raeanne Gathers MD, MD Referring MD:         Tommy Medal, MD (Referring MD) Medicines:            Monitored Anesthesia Care Complications:        No immediate complications. Estimated blood loss: None. Procedure:            Pre-Anesthesia Assessment:                       - Prior to the procedure, a History and Physical was                        performed, and patient medications and allergies were                        reviewed. The patient is competent. The risks and                        benefits of the procedure and the sedation options and                        risks were discussed with the patient. All questions                        were answered and informed consent was obtained.                        Patient identification and proposed procedure were                        verified by the physician, the nurse, the                        anesthesiologist, the anesthetist and the technician in                        the pre-procedure area in the procedure room in the                        endoscopy suite. Mental Status Examination:  alert and                        oriented. Airway Examination: normal oropharyngeal                        airway and neck mobility. Respiratory Examination:       clear to auscultation. CV Examination: normal.                        Prophylactic Antibiotics: The patient does not require                        prophylactic antibiotics. Prior Anticoagulants: The                        patient has taken no previous anticoagulant or                        antiplatelet agents. ASA Grade Assessment: III - A                        patient with severe systemic disease. After reviewing                        the risks and benefits, the patient was deemed in                        satisfactory condition to undergo the procedure. The                        anesthesia plan was to use monitored anesthesia care                        (MAC). Immediately prior to administration of                        medications, the patient was re-assessed for adequacy                        to receive sedatives. The heart rate, respiratory rate,                        oxygen saturations, blood pressure, adequacy of                        pulmonary ventilation, and response to care were                        monitored throughout the procedure. The physical status                        of the patient was re-assessed after the procedure.                       After obtaining informed consent, the colonoscope was                        passed under direct vision. Throughout the procedure,  the patient's blood pressure, pulse, and oxygen                        saturations were monitored continuously. The                        Colonoscope was introduced through the anus and                        advanced to the the terminal ileum. The colonoscopy was                        performed without difficulty. The patient tolerated the                        procedure fairly well. The quality of the bowel                        preparation was evaluated using the BBPS [facility] Bowel                        Preparation Scale) with scores of: Right Colon = 3,                         Transverse Colon = 3 and Left Colon = 3 (entire mucosa                        seen well with no residual staining, small fragments of                        stool or opaque liquid). The total BBPS score equals 9. Findings:      The perianal and digital rectal examinations were normal. Pertinent       negatives include normal sphincter tone and no palpable rectal lesions.      The terminal ileum appeared normal.      Multiple small and large-mouthed diverticula were found in the sigmoid       colon and rectosigmoid areas. Peri-diverticular erythema and mucosal       prolapse was seen. Biopsies were performed There was no evidence of       diverticular bleeding.      The retroflexed view of the distal rectum and anal verge was normal and       showed no anal or rectal abnormalities.      A single diverticulum was found in the transverse colon and ascending       colon.      The exam was otherwise without abnormality. Impression:           - The examined portion of the ileum was normal.                       - Severe diverticulosis in the recto-sigmoid colon and                        in the sigmoid colon. Peri-diverticular erythema was                        seen. There was no evidence of diverticular bleeding.                       -  The distal rectum and anal verge are normal on                        retroflexion view.                       - Diverticulosis in the transverse colon and in the                        ascending colon.                       - The examination was otherwise normal.                       - No specimens collected. Recommendation:       - Discharge patient to home (with escort).                       - DASH diet, low fat diet and low sodium diet.                       - Continue present medications.                       - Await pathology results.                       - Repeat colonoscopy in 5-10 years for surveillance                         based on pathology results.                       - Return to my office as previously scheduled. Procedure Code(s):    --- Professional ---                       S9233, Colorectal cancer screening; colonoscopy on                        individual at high risk Diagnosis Code(s):    --- Professional ---                       Z12.11, Encounter for screening for malignant neoplasm                        of colon                       Z80.0, Family history of malignant neoplasm of                        digestive organs                       Z83.71, Family history of colonic polyps                       K57.30, Diverticulosis of large intestine without                        perforation or abscess without bleeding CPT copyright 2017 American [employer]. All  rights reserved. The codes documented in this report are preliminary and upon coder review may  be revised to meet current compliance requirements. Dr. Ulyess Mort Lin Landsman MD, MD 03/06/2018 11:57:10 AM This report has been signed electronically. Number of Addenda: 0 Note Initiated On: 03/06/2018 11:05 AM Scope Withdrawal Time: 0 hours 10 minutes 39 seconds  Total Procedure Duration: 0 hours 13 minutes 47 seconds       [facility]

## 2018-03-06 NOTE — Anesthesia Post-op Follow-up Note (Signed)
Anesthesia QCDR form completed.        

## 2018-03-06 NOTE — Op Note (Signed)
[facility] Gastroenterology Patient Name: Bruce Graham Procedure Date: 03/06/2018 11:06 AM MRN: [id number] Account #: [id number] Date of Birth: July 09, 1970 Admit Type: Outpatient Age: 47 Room: [facility] ENDO ROOM 2 Gender: Male Note Status: Finalized Procedure:            Upper GI endoscopy Indications:          Follow-up of gastro-esophageal reflux disease,                        Suspected Barrett's esophagus Providers:            Lin Landsman MD, MD Referring MD:         Tommy Medal, MD (Referring MD) Medicines:            Monitored Anesthesia Care Complications:        No immediate complications. Estimated blood loss:                        Minimal. Procedure:            Pre-Anesthesia Assessment:                       - Prior to the procedure, a History and Physical was                        performed, and patient medications and allergies were                        reviewed. The patient is competent. The risks and                        benefits of the procedure and the sedation options and                        risks were discussed with the patient. All questions                        were answered and informed consent was obtained.                        Patient identification and proposed procedure were                        verified by the physician, the nurse, the                        anesthesiologist, the anesthetist and the technician in                        the pre-procedure area in the procedure room in the                        endoscopy suite. Mental Status Examination: alert and                        oriented. Airway Examination: normal oropharyngeal                        airway and neck mobility. Respiratory Examination:  clear to auscultation. CV Examination: normal.                        Prophylactic Antibiotics: The patient does not require                        prophylactic antibiotics. Prior  Anticoagulants: The                        patient has taken no previous anticoagulant or                        antiplatelet agents. ASA Grade Assessment: III - A                        patient with severe systemic disease. After reviewing                        the risks and benefits, the patient was deemed in                        satisfactory condition to undergo the procedure. The                        anesthesia plan was to use monitored anesthesia care                        (MAC). Immediately prior to administration of                        medications, the patient was re-assessed for adequacy                        to receive sedatives. The heart rate, respiratory rate,                        oxygen saturations, blood pressure, adequacy of                        pulmonary ventilation, and response to care were                        monitored throughout the procedure. The physical status                        of the patient was re-assessed after the procedure.                       After obtaining informed consent, the endoscope was                        passed under direct vision. Throughout the procedure,                        the patient's blood pressure, pulse, and oxygen                        saturations were monitored continuously. The Endoscope  was introduced through the mouth, and advanced to the                        second part of duodenum. The upper GI endoscopy was                        performed with moderate difficulty due to the patient's                        body habitus abnd cough. Successful completion of the                        procedure was aided by increasing the dose of sedation                        medication and performing chin lift. The patient                        tolerated the procedure. Findings:      The duodenal bulb and second portion of the duodenum were normal.      A 1 cm hiatal hernia was present.       The entire examined stomach was normal.      The esophagus and gastroesophageal junction were examined with white       light and narrow band imaging (NBI) from a forward view and retroflexed       position. There were esophageal mucosal changes consistent with       short-segment Barrett's esophagus. These changes involved the mucosa at       the upper extent of the gastric folds (39 cm from the incisors)       extending to the Z-line (39 cm from the incisors). Circumferential       salmon-colored mucosa was present from 35 to 37 cm and two tongues of       salmon-colored mucosa were present from 35 to 36 cm. The maximum       longitudinal extent of these esophageal mucosal changes was 2 cm in       length. Mucosa was biopsied with a cold forceps for histology in 4       quadrants at intervals of 2 cm in the lower third of the esophagus. A       total of 2 specimen bottles were sent to pathology. Impression:           - Normal duodenal bulb and second portion of the                        duodenum.                       - 1 cm hiatal hernia.                       - Normal stomach.                       - Esophageal mucosal changes consistent with                        short-segment Barrett's esophagus. Biopsied. Recommendation:       - Await pathology results.                       -  Use a proton pump inhibitor PO BID. Procedure Code(s):    --- Professional ---                       [phone], Esophagogastroduodenoscopy, flexible, transoral;                        with biopsy, single or multiple Diagnosis Code(s):    --- Professional ---                       K22.8, Other specified diseases of esophagus                       K44.9, Diaphragmatic hernia without obstruction or                        gangrene                       K21.9, Gastro-esophageal reflux disease without                        esophagitis CPT copyright 2017 American Medical Association. All rights reserved. The codes  documented in this report are preliminary and upon coder review may  be revised to meet current compliance requirements. Dr. Ulyess Mort Lin Landsman MD, MD 03/06/2018 11:33:58 AM This report has been signed electronically. Number of Addenda: 0 Note Initiated On: 03/06/2018 11:06 AM      [facility]

## 2018-03-06 NOTE — H&P (Addendum)
Cephas Darby, MD [address]  [place]  [place], [place] [address]  Main: [phone]  Fax: [phone] Pager: [phone]  Primary Care Physician:  Tommy Medal, MD Primary Gastroenterologist:  Dr. Cephas Darby  Pre-Procedure History & Physical: HPI:  Bruce Graham is a 47 y.o. male is here for an endoscopy and colonoscopy.   Past Medical History:  Diagnosis Date  . Abscess of right thigh 06/16/2015  . Bronchitis   . Ear infection 12/13/2017   taking amoxicillin  . GERD (gastroesophageal reflux disease)    tums prn  . Hydradenitis    Left leg  . 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 Umbilical hernia without obstruction and without gangrene 11/22/2017    Past Surgical History:  Procedure Laterality Date  . HERNIA REPAIR    . INCISION AND DRAINAGE PERIRECTAL ABSCESS Right 06/16/2015   Procedure: IRRIGATION AND DEBRIDEMENT right inner thigh ABSCESS;  Surgeon: Florene Glen, MD;  Location: [facility] ORS;  Service: General;  Laterality: Right;  . INSERTION OF MESH N/A 12/20/2017   Procedure: INSERTION OF MESH;  Surgeon: [name], MD;  Location: [facility];  Service: General;  Laterality: N/A;  . KNEE SURGERY Left   . UMBILICAL HERNIA REPAIR N/A 12/20/2017   Procedure: HERNIA REPAIR UMBILICAL ADULT;  Surgeon: [name], MD;  Location: [facility];  Service: General;  Laterality: N/A;    Prior to Admission medications   Medication Sig Start Date End Date Taking? Authorizing Provider  amoxicillin-clavulanate (AUGMENTIN) 875-125 MG tablet Take 1 tablet by mouth every 12 (twelve) hours. Patient not taking: Reported on 02/15/2018 01/13/18   Coral Spikes, DO  calcium carbonate (TUMS - DOSED IN MG ELEMENTAL CALCIUM) 500 MG chewable tablet Chew 1 tablet by mouth as needed for indigestion or heartburn.    [provider]  omeprazole (PRILOSEC) 40 MG capsule Take 1 capsule (40 mg total) by mouth 2 (two) times daily before a meal. 02/15/18 03/17/18   Lin Landsman, MD    Allergies as of 02/16/2018  . (No Known Allergies)    Family History  Problem Relation Age of Onset  . Heart disease Father 59  . Cancer Father        Lung Cancer    Social History   Socioeconomic History  . Marital status: Single    Spouse name: Not on file  . Number of children: Not on file  . Years of education: Not on file  . Highest education level: Not on file  Occupational History  . Not on file  Social Needs  . Financial resource strain: Not on file  . Food insecurity:    Worry: Not on file    Inability: Not on file  . Transportation needs:    Medical: Not on file    Non-medical: Not on file  Tobacco Use  . Smoking status: Current Every Day Smoker    Packs/day: 0.50    Years: 30.00    Pack years: 15.00    Types: Cigarettes  . Smokeless tobacco: Never Used  Substance and Sexual Activity  . Alcohol use: Yes    Comment: social  . Drug use: No  . Sexual activity: Not on file  Lifestyle  . Physical activity:    Days per week: Not on file    Minutes per session: Not on file  . Stress: Not on file  Relationships  . Social connections:    Talks on phone: Not on file  Gets together: Not on file    Attends religious service: Not on file    Active member of club or organization: Not on file    Attends meetings of clubs or organizations: Not on file    Relationship status: Not on file  . Intimate partner violence:    Fear of current or ex partner: Not on file    Emotionally abused: Not on file    Physically abused: Not on file    Forced sexual activity: Not on file  Other Topics Concern  . Not on file  Social History Narrative  . Not on file    Review of Systems: See HPI, otherwise negative ROS  Physical Exam: BP (!) 137/99   Pulse 74   Temp (!) 97.2 F (36.2 C) (Tympanic)   Resp 16   SpO2 98%  General:   Alert,  pleasant and cooperative in NAD Head:  Normocephalic and atraumatic. Neck:  Supple; no masses or  thyromegaly. Lungs:  Clear throughout to auscultation.    Heart:  Regular rate and rhythm. Abdomen:  Soft, nontender and nondistended. Normal bowel sounds, without guarding, and without rebound.   Neurologic:  Alert and  oriented x4;  grossly normal neurologically.  Impression/Plan: Gurvir Schrom is here for an endoscopy and colonoscopy to be performed for fam h/o colon cancer and chronic GERD  Risks, benefits, limitations, and alternatives regarding  endoscopy and colonoscopy have been reviewed with the patient.  Questions have been answered.  All parties agreeable.   Sherri Sear, MD  03/06/2018, 10:59 AM

## 2018-03-06 NOTE — Anesthesia Preprocedure Evaluation (Signed)
Anesthesia Evaluation  Patient identified by MRN, date of birth, ID band Patient awake    Reviewed: Allergy & Precautions, H&P , NPO status , Patient's Chart, lab work & pertinent test results, reviewed documented beta blocker date and time   Airway Mallampati: II   Neck ROM: full    Dental  (+) Poor Dentition   Pulmonary neg pulmonary ROS, Current Smoker,    Pulmonary exam normal        Cardiovascular Exercise Tolerance: Good negative cardio ROS Normal cardiovascular exam Rhythm:regular Rate:Normal     Neuro/Psych negative neurological ROS  negative psych ROS   GI/Hepatic negative GI ROS, Neg liver ROS, GERD  ,  Endo/Other  negative endocrine ROS  Renal/GU negative Renal ROS  negative genitourinary   Musculoskeletal   Abdominal   Peds  Hematology negative hematology ROS (+)   Anesthesia Other Findings Past Medical History: 06/16/2015: Abscess of right thigh No date: Bronchitis 12/13/2017: Ear infection     Comment:  taking amoxicillin No date: GERD (gastroesophageal reflux disease)     Comment:  tums prn No date: Hydradenitis     Comment:  Left leg 08/07/2015: Suppurative hidradenitis 6/33/3545: Umbilical hernia without obstruction and without gangrene Past Surgical History: No date: HERNIA REPAIR 06/16/2015: INCISION AND DRAINAGE PERIRECTAL ABSCESS; Right     Comment:  Procedure: IRRIGATION AND DEBRIDEMENT right inner thigh               ABSCESS;  Surgeon: Florene Glen, MD;  Location: [facility]               ORS;  Service: General;  Laterality: Right; 12/20/2017: INSERTION OF MESH; N/A     Comment:  Procedure: INSERTION OF MESH;  Surgeon: [name], MD;  Location: [facility];  Service: General;                Laterality: N/A; No date: KNEE SURGERY; Left 12/21/6387: UMBILICAL HERNIA REPAIR; N/A     Comment:  Procedure: HERNIA REPAIR UMBILICAL ADULT;  Surgeon:               [name], MD;  Location: [facility];  Service:               General;  Laterality: N/A;   Reproductive/Obstetrics negative OB ROS                             Anesthesia Physical Anesthesia Plan  ASA: III  Anesthesia Plan: General   Post-op Pain Management:    Induction:   PONV Risk Score and Plan:   Airway Management Planned:   Additional Equipment:   Intra-op Plan:   Post-operative Plan:   Informed Consent: I have reviewed the patients History and Physical, chart, labs and discussed the procedure including the risks, benefits and alternatives for the proposed anesthesia with the patient or authorized representative who has indicated his/her understanding and acceptance.   Dental Advisory Given  Plan Discussed with: CRNA  Anesthesia Plan Comments:         Anesthesia Quick Evaluation

## 2018-03-07 ENCOUNTER — Telehealth: Payer: Self-pay | Admitting: Gastroenterology

## 2018-03-07 ENCOUNTER — Encounter: Payer: Self-pay | Admitting: Gastroenterology

## 2018-03-07 NOTE — Telephone Encounter (Signed)
Spoke with pt and omeprazole has been sent to pharmacy

## 2018-03-07 NOTE — Telephone Encounter (Signed)
PT  is calling he had procedure yesterday and Dr. Marius Ditch was supposed to send rx and it was not send in

## 2018-03-09 LAB — SURGICAL PATHOLOGY

## 2018-03-15 ENCOUNTER — Telehealth: Payer: Self-pay | Admitting: Gastroenterology

## 2018-03-15 NOTE — Telephone Encounter (Signed)
Pt was returning phone call.

## 2018-03-21 ENCOUNTER — Ambulatory Visit: Payer: Managed Care, Other (non HMO) | Admitting: Gastroenterology

## 2018-03-21 ENCOUNTER — Encounter: Payer: Self-pay | Admitting: Gastroenterology

## 2018-03-21 VITALS — BP 129/79 | HR 73 | Ht 69.0 in | Wt 234.0 lb

## 2018-03-21 DIAGNOSIS — K219 Gastro-esophageal reflux disease without esophagitis: Secondary | ICD-10-CM

## 2018-03-21 DIAGNOSIS — K640 First degree hemorrhoids: Secondary | ICD-10-CM | POA: Diagnosis not present

## 2018-03-21 DIAGNOSIS — K2270 Barrett's esophagus without dysplasia: Secondary | ICD-10-CM

## 2018-03-21 NOTE — Progress Notes (Signed)
Cephas Darby, MD [address]  [place]  [place], [place] [address]  Main: [phone]  Fax: [phone]    Gastroenterology Consultation  Referring Provider:     Tommy Medal, MD Primary Care Physician:  Tommy Medal, MD Primary Gastroenterologist:  Dr. Cephas Darby Reason for Consultation:  Symptomatic hemorrhoids, Barrett's esophagus        HPI:   Bruce Graham is a 47 y.o. male referred by Dr. Tommy Medal, MD  for consultation & management of intermittent blood in stool. He has been noticing several months history of blood on wiping, describes it as dark blood within the stool, associated bloating. He does report occasional constipation. 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 He also reports abdominal bloating. No evidence of anemia, CMP unremarkable  He used to drink 2-3 pepsi 20oz bottles, stopped them, lost about 20lbs since then  Also reports heart burn for several years, heartburn occurs both during the and night associated with burping Did not try PPI yet He denies dysphagia, regurgitation  Follow-up visit 03/21/2018 He underwent EGD and colonoscopy. EGD revealed short segment Barrett's with no evidence of dysplasia. Colonoscopy was unremarkable. Patient is taking omeprazole 40 mg once a day. His insurance did not approve for twice daily omeprazole. Currently, he denies symptoms of reflux. He continues to have rectal bleeding. He is here to discuss about hemorrhoidal ligation  NSAIDs: none  Antiplts/Anticoagulants/Anti thrombotics: none  GI Procedures:  Colonoscopy 17years ago, reportedly normal. This was performed when his father was found to have colon polyps He reports family history of GI malignancy in a distant relative and first-degree relative. He had umbilical hernia repair  EGD 03/16/2018 - Normal duodenal bulb and second portion of the duodenum. -  1 cm hiatal hernia. - Normal stomach. - Esophageal mucosal changes consistent with short-segment Barrett's esophagus. Biopsied.  Colonoscopy 03/06/2018 - The examined portion of the ileum was normal. - Severe diverticulosis in the recto-sigmoid colon and in the sigmoid colon. Peri-diverticular erythema was seen. There was no evidence of diverticular bleeding. - The distal rectum and anal verge are normal on retroflexion view. - Diverticulosis in the transverse colon and in the ascending colon. - The examination was otherwise normal. - No specimens collected.  DIAGNOSIS:  A. ESOPHAGUS, 37 CM; COLD BIOPSY:  - BARRETT'S ESOPHAGUS.  - MILD CHRONIC ACTIVE INFLAMMATION.  - NEGATIVE FOR DYSPLASIA AND MALIGNANCY.   B. ESOPHAGUS, 35 CM; COLD BIOPSY:  - BARRETT'S ESOPHAGUS.  - MODERATE CHRONIC ACTIVE INFLAMMATION.  - NEGATIVE FOR DYSPLASIA AND MALIGNANCY.   C. SIGMOID COLON, SEVERE ERYTHEMA; COLD BIOPSY:  - CRYPT HYPERPLASIA, SMOOTH MUSCLE HYPERPLASIA, CONGESTION, HEMOSIDERIN  DEPOSITION, AND CHRONIC INFLAMMATION, SUGGESTIVE OF MUCOSAL PROLAPSE OR  INJURY.  - NEGATIVE FOR DYSPLASIA AND MALIGNANCY.  Past Medical History:  Diagnosis Date  . Abscess of right thigh 06/16/2015  . Bronchitis   . Ear infection 12/13/2017   taking amoxicillin  . GERD (gastroesophageal reflux disease)    tums prn  . Hydradenitis    Left leg  . 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 Umbilical hernia without obstruction and without gangrene 11/22/2017    Past Surgical History:  Procedure Laterality Date  . COLONOSCOPY WITH PROPOFOL N/A 03/06/2018   Procedure: COLONOSCOPY WITH PROPOFOL;  Surgeon: Lin Landsman, MD;  Location: [facility] END[facility];  Service: Gastroenterology;  Laterality: N/A;  . ESOPHAGOGASTRODUODENOSCOPY (EGD) WITH PROPOFOL N/A 03/06/2018  Procedure: ESOPHAGOGASTRODUODENOSCOPY (EGD) WITH PROPOFOL;  Surgeon: Lin Landsman, MD;  Location: [facility] END[facility]COPY;  Service: Gastroenterology;   Laterality: N/A;  . HERNIA REPAIR    . INCISION AND DRAINAGE PERIRECTAL ABSCESS Right 06/16/2015   Procedure: IRRIGATION AND DEBRIDEMENT right inner thigh ABSCESS;  Surgeon: Florene Glen, MD;  Location: [facility] OR[facility];  Service: General;  Laterality: Right;  . INSERTION OF MESH N/A 12/20/2017   Procedure: INSERTION OF MESH;  Surgeon: [name], MD;  Location: [facility];  Service: General;  Laterality: N/A;  . KNEE SURGERY Left   . UMBILICAL HERNIA REPAIR N/A 12/20/2017   Procedure: HERNIA REPAIR UMBILICAL ADULT;  Surgeon: [name], MD;  Location: [facility] OR[facility];  Service: General;  Laterality: N/A;    Current Outpatient Medications:  .  calcium carbonate (TUMS - DOSED IN MG ELEMENTAL CALCIUM) 500 MG chewable tablet, Chew 1 tablet by mouth as needed for indigestion or heartburn., Disp: , Rfl:  .  omeprazole (PRILOSEC) 40 MG capsule, Take 1 capsule (40 mg total) by mouth 2 (two) times daily before a meal., Disp: 60 capsule, Rfl: 0    Family History  Problem Relation Age of Onset  . Heart disease Father 69  . Cancer Father        Lung Cancer     Social History   Tobacco Use  . Smoking status: Current Every Day Smoker    Packs/day: 0.50    Years: 30.00    Pack years: 15.00    Types: Cigarettes  . Smokeless tobacco: Never Used  Substance Use Topics  . Alcohol use: Yes    Comment: social  . Drug use: No    Allergies as of 03/21/2018  . (No Known Allergies)    Review of Systems:    All systems reviewed and negative except where noted in HPI.   Physical Exam:  BP 129/79   Pulse 73   Ht 5' 9"  (1.753 m)   Wt 234 lb (106.1 kg)   BMI 34.56 kg/m  No LMP for male patient.  General:   Alert,  Well-developed, well-nourished, pleasant and cooperative in NAD Head:  Normocephalic and atraumatic. Eyes:  Sclera clear, no icterus.   Conjunctiva pink. Ears:  Normal auditory acuity. Nose:  No deformity, discharge, or lesions. Mouth:  No deformity or lesions,oropharynx pink &  moist. Neck:  Supple; no masses or thyromegaly. Lungs:  Respirations even and unlabored.  Clear throughout to auscultation.   No wheezes, crackles, or rhonchi. No acute distress. Heart:  Regular rate and rhythm; no murmurs, clicks, rubs, or gallops. Abdomen:  Normal bowel sounds. Soft, obese, non-tender and non-distended without masses, hepatosplenomegaly or hernias noted.  No guarding or rebound tenderness.   Rectal: Not performed Msk:  Symmetrical without gross deformities. Good, equal movement & strength bilaterally. Pulses:  Normal pulses noted. Extremities:  No clubbing or edema.  No cyanosis. Neurologic:  Alert and oriented x3;  grossly normal neurologically. Skin:  Intact without significant lesions or rashes. No jaundice. Lymph Nodes:  No significant cervical adenopathy. Psych:  Alert and cooperative. Normal mood and affect.  Imaging Studies: No abdominal imaging  Assessment and Plan:   Colvin Blatt is a 47 y.o. Caucasian male with morbid obesity, chronic heartburn, intermittent blood in stool  Chronic heartburn:EGD without erosive esophagitis continue omeprazole 40 mg daily  Nondysplastic Barrett's esophagus, short segment: Repeat EGD in 3-5 years Continue omeprazole 40 mg daily  Rectal bleeding: Colonoscopy unremarkable Discussed with him about outpatient hemorrhoid ligation  and patient is agreeable Consent obtained, perform hemorrhoid ligation today   Follow up in 2 weeks   Cephas Darby, MD

## 2018-04-06 ENCOUNTER — Ambulatory Visit: Payer: Managed Care, Other (non HMO) | Admitting: Gastroenterology

## 2018-05-09 ENCOUNTER — Other Ambulatory Visit: Payer: Self-pay | Admitting: Gastroenterology

## 2018-05-09 DIAGNOSIS — K219 Gastro-esophageal reflux disease without esophagitis: Secondary | ICD-10-CM

## 2018-05-12 ENCOUNTER — Ambulatory Visit: Payer: Managed Care, Other (non HMO) | Admitting: Gastroenterology

## 2018-05-19 ENCOUNTER — Ambulatory Visit: Payer: Managed Care, Other (non HMO) | Admitting: Gastroenterology

## 2018-06-06 ENCOUNTER — Ambulatory Visit: Payer: 59 | Admitting: Gastroenterology

## 2020-03-12 ENCOUNTER — Other Ambulatory Visit: Payer: Self-pay

## 2020-03-12 ENCOUNTER — Ambulatory Visit (INDEPENDENT_AMBULATORY_CARE_PROVIDER_SITE_OTHER): Payer: Managed Care, Other (non HMO) | Admitting: Dermatology

## 2020-03-12 ENCOUNTER — Encounter: Payer: Self-pay | Admitting: Dermatology

## 2020-03-12 DIAGNOSIS — L732 Hidradenitis suppurativa: Secondary | ICD-10-CM

## 2020-03-12 DIAGNOSIS — L820 Inflamed seborrheic keratosis: Secondary | ICD-10-CM | POA: Diagnosis not present

## 2020-03-12 DIAGNOSIS — L720 Epidermal cyst: Secondary | ICD-10-CM | POA: Diagnosis not present

## 2020-03-12 DIAGNOSIS — L03317 Cellulitis of buttock: Secondary | ICD-10-CM | POA: Diagnosis not present

## 2020-03-12 MED ORDER — DOXYCYCLINE HYCLATE 100 MG PO TABS: 100.0000 mg | ORAL_TABLET | Freq: Two times a day (BID) | ORAL | 0 refills | Status: DC

## 2020-03-12 NOTE — Patient Instructions (Signed)
Doxycycline 100 1 tablet twice daily with food and plenty of fluid x 2 weeks then decrease to once daily  Wound Care Instructions  1. Cleanse wound gently with soap and water once a day then pat dry with clean gauze. Apply a thing coat of Petrolatum (petroleum jelly, "Vaseline") over the wound (unless you have an allergy to this). We recommend that you use a new, sterile tube of Vaseline. Do not pick or remove scabs. Do not remove the yellow or white "healing tissue" from the base of the wound.  2. Cover the wound with fresh, clean, nonstick gauze and secure with paper tape. You may use Band-Aids in place of gauze and tape if the would is small enough, but would recommend trimming much of the tape off as there is often too much. Sometimes Band-Aids can irritate the skin.  3. You should call the office for your biopsy report after 1 week if you have not already been contacted.  4. If you experience any problems, such as abnormal amounts of bleeding, swelling, significant bruising, significant pain, or evidence of infection, please call the office immediately.  5. FOR ADULT SURGERY PATIENTS: If you need something for pain relief you may take 1 extra strength Tylenol (acetaminophen) AND 2 Ibuprofen (273m each) together every 4 hours as needed for pain. (do not take these if you are allergic to them or if you have a reason you should not take them.) Typically, you may only need pain medication for 1 to 3 days.

## 2020-03-12 NOTE — Progress Notes (Signed)
   Follow-Up Visit   Subjective  Bruce Graham is a 49 y.o. male who presents for the following: Cyst (right buttock that has been draining). Past history of boils; cysts and diagnosis of Hidradenitis with past history of surgery of groin for Hidradenitis.  The following portions of the chart were reviewed this encounter and updated as appropriate:  Tobacco  Allergies  Meds  Problems  Med Hx  Surg Hx  Fam Hx     Review of Systems:  No other skin or systemic complaints except as noted in HPI or Assessment and Plan.  Objective  Well appearing patient in no apparent distress; mood and affect are within normal limits.  A focused examination was performed including left arm, face, buttocks. Relevant physical exam findings are noted in the Assessment and Plan.  Objective  Right buttock, Right cheek: 8.0 cm firm nodule, no fluctuance of right buttock.  1.0 cm cystic papule of right cheek  Objective  Left Forearm - Posterior: Erythematous keratotic or waxy stuck-on papule or plaque.    Assessment & Plan  Epidermal inclusion cyst cheek and Cellulitis (no abscess) of buttocks - acute and flared. Right buttock; Right cheek  Doxycycline 100 1 tablet twice daily with food and plenty of fluid x 2 weeks then decrease to once daily  doxycycline (VIBRA-TABS) 100 MG tablet - Right buttock   Consider excision of cheek lesion in future.  Re-check next visit.  Hidradenitis suppurativa Groin area And related to cysts and boils and current buttocks and cheek lesions. Will discuss further treatment options such as long term doxycycline; Isotretinoin and Humira.  Inflamed seborrheic keratosis Left Forearm - Posterior  Destruction of lesion - Left Forearm - Posterior Complexity: simple   Destruction method: cryotherapy   Informed consent: discussed and consent obtained   Timeout:  patient name, date of birth, surgical site, and procedure verified Lesion destroyed using liquid  nitrogen: Yes   Region frozen until ice ball extended beyond lesion: Yes   Outcome: patient tolerated procedure well with no complications   Post-procedure details: wound care instructions given    Return for follow up in 3-4 weeks.   I, Ashok Cordia, CMA, am acting as scribe for Sarina Ser, MD .  Documentation: I have reviewed the above documentation for accuracy and completeness, and I agree with the above.  Sarina Ser, MD

## 2020-03-25 ENCOUNTER — Other Ambulatory Visit: Payer: Self-pay

## 2020-03-25 ENCOUNTER — Encounter: Payer: Self-pay | Admitting: Emergency Medicine

## 2020-03-25 ENCOUNTER — Ambulatory Visit
Admission: EM | Admit: 2020-03-25 | Discharge: 2020-03-25 | Disposition: A | Discharge status: Home / Self Care | Payer: Managed Care, Other (non HMO) | Attending: Emergency Medicine | Admitting: Emergency Medicine

## 2020-03-25 DIAGNOSIS — R109 Unspecified abdominal pain: Secondary | ICD-10-CM | POA: Insufficient documentation

## 2020-03-25 LAB — URINALYSIS, COMPLETE (UACMP) WITH MICROSCOPIC
Bacteria, UA: NONE SEEN
Bilirubin Urine: NEGATIVE
Glucose, UA: NEGATIVE mg/dL
Ketones, ur: NEGATIVE mg/dL
Leukocytes,Ua: NEGATIVE
Nitrite: NEGATIVE
Protein, ur: NEGATIVE mg/dL
Specific Gravity, Urine: 1.015 (ref 1.005–1.030)
WBC, UA: NONE SEEN WBC/hpf (ref 0–5)
pH: 7 (ref 5.0–8.0)

## 2020-03-25 MED ORDER — KETOROLAC TROMETHAMINE 60 MG/2ML IM SOLN
60.0000 mg | Freq: Once | INTRAMUSCULAR | Status: AC
  Administered 2020-03-25: 60 mg via INTRAMUSCULAR

## 2020-03-25 NOTE — ED Provider Notes (Signed)
[facility]    CSN: [id number] Arrival date & time: 03/25/20  1715      History   Chief Complaint Chief Complaint  Patient presents with  . Abdominal Pain    HPI Candace Ramus is a 49 y.o. male.   The history is provided by the patient. No language interpreter was used.  Abdominal Pain Pain location:  LUQ, LLQ and L flank Pain quality: throbbing   Pain radiates to:  Does not radiate Pain severity:  Mild Onset quality:  Sudden Duration: this am. Timing:  Constant Progression:  Unchanged Chronicity:  Recurrent Context: not sick contacts, not suspicious food intake and not trauma   Relieved by:  None tried Worsened by:  Movement and palpation Ineffective treatments:  None tried Risk factors: obesity   Risk factors comment:  Smoking   Past Medical History:  Diagnosis Date  . Abscess of right thigh 06/16/2015  . Bronchitis   . Ear infection 12/13/2017   taking amoxicillin  . GERD (gastroesophageal reflux disease)    tums prn  . Hydradenitis    Left leg  . 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 Umbilical hernia without obstruction and without gangrene 11/22/2017    Patient Active Problem List   Diagnosis Date Noted  . Left sided abdominal pain of unknown cause 03/25/2020  . Family history of colon cancer   . GERD (gastroesophageal reflux disease) 06/16/2015    Past Surgical History:  Procedure Laterality Date  . COLONOSCOPY WITH PROPOFOL N/A 03/06/2018   Procedure: COLONOSCOPY WITH PROPOFOL;  Surgeon: Lin Landsman, MD;  Location: [facility] ENDOSCOPY;  Service: Gastroenterology;  Laterality: N/A;  . ESOPHAGOGASTRODUODENOSCOPY (EGD) WITH PROPOFOL N/A 03/06/2018   Procedure: ESOPHAGOGASTRODUODENOSCOPY (EGD) WITH PROPOFOL;  Surgeon: Lin Landsman, MD;  Location: [facility] ENDOSCOPY;  Service: Gastroenterology;  Laterality: N/A;  . HERNIA REPAIR    . INCISION AND DRAINAGE PERIRECTAL ABSCESS Right 06/16/2015   Procedure: IRRIGATION AND DEBRIDEMENT right  inner thigh ABSCESS;  Surgeon: Florene Glen, MD;  Location: [facility] ORS;  Service: General;  Laterality: Right;  . INSERTION OF MESH N/A 12/20/2017   Procedure: INSERTION OF MESH;  Surgeon: [name], MD;  Location: [facility] ORS;  Service: General;  Laterality: N/A;  . KNEE SURGERY Left   . UMBILICAL HERNIA REPAIR N/A 12/20/2017   Procedure: HERNIA REPAIR UMBILICAL ADULT;  Surgeon: [name], MD;  Location: [facility] ORS;  Service: General;  Laterality: N/A;       Home Medications    Prior to Admission medications   Medication Sig Start Date End Date Taking? Authorizing Provider  doxycycline (VIBRA-TABS) 100 MG tablet Take 1 tablet (100 mg total) by mouth in the morning and at bedtime. With food and plenty of fluid 03/12/20 04/11/20 Yes Ralene Bathe, MD  calcium carbonate (TUMS - DOSED IN MG ELEMENTAL CALCIUM) 500 MG chewable tablet Chew 1 tablet by mouth as needed for indigestion or heartburn.    [provider]  omeprazole (PRILOSEC) 40 MG capsule TAKE 1 CAPSULE (40 MG TOTAL) BY MOUTH 2 (TWO) TIMES DAILY BEFORE A MEAL. 05/10/18 06/09/18  Lin Landsman, MD    Family History Family History  Problem Relation Age of Onset  . Heart disease Father 73  . Cancer Father        Lung Cancer    Social History Social History   Tobacco Use  . Smoking status: Current Every Day Smoker    Packs/day: 0.50    Years: 30.00  Pack years: 15.00    Types: Cigarettes  . Smokeless tobacco: Never Used  Vaping Use  . Vaping Use: Never used  Substance Use Topics  . Alcohol use: Yes    Comment: social  . Drug use: No     Allergies   Patient has no known allergies.   Review of Systems Review of Systems  Gastrointestinal: Positive for abdominal pain.     Physical Exam Triage Vital Signs ED Triage Vitals  Enc Vitals Group     BP      Pulse      Resp      Temp      Temp src      SpO2      Weight      Height      Head Circumference      Peak Flow       Pain Score      Pain Loc      Pain Edu?      Excl. in [place]?    No data found.  Updated Vital Signs BP 140/80 (BP Location: Right Arm)   Pulse 72   Temp 98.7 F (37.1 C) (Oral)   Resp 18   Ht _0  (1.778 m)   Wt 236 lb (107 kg)   SpO2 98%   BMI 33.86 kg/m    Physical Exam Vitals and nursing note reviewed.  Constitutional:      General: He is not in acute distress.    Appearance: He is well-developed.  Eyes:     Pupils: Pupils are equal, round, and reactive to light.  Cardiovascular:     Rate and Rhythm: Normal rate and regular rhythm.     Heart sounds: Normal heart sounds.  Pulmonary:     Effort: Pulmonary effort is normal.     Breath sounds: Normal breath sounds.  Abdominal:     General: Bowel sounds are normal. There is no distension.     Palpations: Abdomen is soft.     Tenderness: There is no abdominal tenderness. There is no guarding or rebound.       Comments: obese  Musculoskeletal:        General: Normal range of motion.     Cervical back: Normal range of motion.  Skin:    General: Skin is warm and dry.     Findings: No rash.  Neurological:     General: No focal deficit present.     Mental Status: He is alert and oriented to person, place, and time.     GCS: GCS eye subscore is 4. GCS verbal subscore is 5. GCS motor subscore is 6.  Psychiatric:        Attention and Perception: Attention normal.        Mood and Affect: Mood normal.        Speech: Speech normal.        Behavior: Behavior normal.      UC Treatments / Results  Labs (all labs ordered are listed, but only abnormal results are displayed) Labs Reviewed  URINALYSIS, COMPLETE (UACMP) WITH MICROSCOPIC - Abnormal; Notable for the following components:      Result Value   Hgb urine dipstick TRACE (*)    All other components within normal limits    EKG   Radiology No results found.  Procedures Procedures (including critical care time)  Medications Ordered in UC Medications    ketorolac (TORADOL) injection 60 mg (60 mg Intramuscular Given 03/25/20 1806)  Initial Impression / Assessment and Plan / UC Course  I have reviewed the triage vital signs and the nursing notes.  Pertinent labs & imaging results that were available during my care of the patient were reviewed by me and considered in my medical decision making (see chart for details).     Discussed with pt DDX: Diverticulitis, kidney stone, constipation. No CT available in UC today, recommend if pain persists or develops N,V,fever got to Er for additional labs and imaging. Pt verbalized understanding to this provider. Reports relief w Toradol.  Final Clinical Impressions(s) / UC Diagnoses   Final diagnoses:  Left sided abdominal pain of unknown cause     Discharge Instructions     Go to ER for new or worsening abdominal pain,nausea,vomiting, fever, etc. Stop smoking.     ED Prescriptions    None     PDMP not reviewed this encounter.   Tori Milks, NP 16/60/63 2031

## 2020-03-25 NOTE — ED Triage Notes (Signed)
Patient c/o left upper quadrant pain that started this morning. Patient states it hurts to walk and hurts to press on that area.

## 2020-03-25 NOTE — Discharge Instructions (Addendum)
Go to ER for new or worsening abdominal pain,nausea,vomiting, fever, etc. Stop smoking.

## 2020-04-02 ENCOUNTER — Ambulatory Visit: Payer: Managed Care, Other (non HMO) | Admitting: Dermatology

## 2020-04-02 ENCOUNTER — Other Ambulatory Visit: Payer: Self-pay

## 2020-04-02 ENCOUNTER — Encounter: Payer: Self-pay | Admitting: Dermatology

## 2020-04-02 DIAGNOSIS — L720 Epidermal cyst: Secondary | ICD-10-CM | POA: Diagnosis not present

## 2020-04-02 DIAGNOSIS — L732 Hidradenitis suppurativa: Secondary | ICD-10-CM | POA: Diagnosis not present

## 2020-04-02 MED ORDER — DOXYCYCLINE HYCLATE 100 MG PO TABS: 100.0000 mg | ORAL_TABLET | Freq: Two times a day (BID) | ORAL | 1 refills | Status: DC

## 2020-04-02 NOTE — Patient Instructions (Addendum)
Pre-Operative Instructions  You are scheduled for a surgical procedure at [facility]. We recommend you read the following instructions. If you have any questions or concerns, please call the office at [phone].  1. Shower and wash the entire body with soap and water the day of your surgery paying special attention to cleansing at and around the planned surgery site.  2. Avoid aspirin or aspirin containing products at least fourteen (14) days prior to your surgical procedure and for at least one week (7 Days) after your surgical procedure. If you take aspirin on a regular basis for heart disease or history of stroke or for any other reason, we may recommend you continue taking aspirin but please notify us if you take this on a regular basis. Aspirin can cause more bleeding to occur during surgery as well as prolonged bleeding and bruising after surgery.   3. Avoid other nonsteroidal pain medications at least one week prior to surgery and at least one week prior to your surgery. These include medications such as Ibuprofen (Motrin, Advil and Nuprin), Naprosyn, Voltaren, Relafen, etc. If medications are used for therapeutic reasons, please inform us as they can cause increased bleeding or prolonged bleeding during and bruising after surgical procedures.   4. Please advice [place] if you are taking any "blood thinner" medications such as Coumadin or Dipyridamole or Plavix or similar medications. These cause increased bleeding and prolonged bleeding during and bruising after surgical procedures. We may have to consider discontinuing these medications briefly prior to and shortly after your surgery, if safe to do so.   5. Please inform us of all medications you are currently taking. All medications that are taken regularly should be taken the day of surgery as you always do. Nevertheless, we need to be informed of what medications you are taking prior to surgery to whether they will affect the  procedure or cause any complications.   6. Please inform us of any medication allergies. Also inform us of whether you have allergies to Latex or rubber products or whether you have had any adverse reaction to Lidocaine or Epinephrine.  7. Please inform us of any prosthetic or artificial body parts such as artificial heart valve, joint replacements, etc., or similar condition that might require preoperative antibiotics.   8. We recommend avoidance of alcohol at least two weeks prior to surgery and continued avoidence for at least two weeks after surgery.   9. We recommend discontinuation of tobacco smoking at least two weeks prior to surgery and continued abstinence for at least two weeks after surgery.  10. Do not plan strenuous exercise, strenuous work or strenuous lifting for approximately four weeks after your surgery.   11. We request if you are unable to make your scheduled surgical appointment, please call us at least a week in advance or as soon as you are aware of a problem sot aht we can cancel or reschedule you.   12. You MAKE TAKE TYLENOL (acetaminophen) for pain as it is not a blood thinner.   13. PLEASE PLAN TO BE IN TOWN FOR TWO WEEKS FOLLOWING SURGERY, THIS IS IMPORTANT SO YOU CAN BE CHECKED FOR DRESSING CHANGES, SUTURE REMOVAL AND TO MONITOR FOR POSSIBLE COMPLICATIONS.

## 2020-04-02 NOTE — Progress Notes (Signed)
   Follow-Up Visit   Subjective  Bruce Graham is a 49 y.o. male who presents for the following: recheck cyst with cellulitis (R buttocks, pt said it is still draining.  Doxycycline 154m 1 po bid x 2 wks no currently on qd) and Cyst (R cheek, discuss treatment options). The patient has long history of hidradenitis suppurativa with frequent boils and cyst.  He had an area of the groin excised by me in the remote past.  [place] [facility] recent last visit.  The following portions of the chart were reviewed this encounter and updated as appropriate:  Tobacco  Allergies  Meds  Problems  Med Hx  Surg Hx  Fam Hx     Review of Systems:  No other skin or systemic complaints except as noted in HPI or Assessment and Plan.  Objective  Well appearing patient in no apparent distress; mood and affect are within normal limits.  A focused examination was performed including face, buttocks. Relevant physical exam findings are noted in the Assessment and Plan.  Objective  R buttock cheek: 8.0 cm firm nodule, no fluctuance of currently draining   Objective  R cheek: 1.0cm cystic pap  Objective  groin: No exam today   Assessment & Plan  Ruptured inflamed cyst with associated hidradenitis -persistent painful and draining actively -despite several week treatment with oral doxycycline R buttock cheek Acute and flared Recommend surgical excision.  Because of the large size and location and possible communication with the perianal area, this would be very difficult to do under local anesthesia in the office.  Therefore we will will refer pt to Dr. JHervey Ard-general surgeon for evaluation and surgical treatment options discussion  Cont Doxycycline 109m1 po qd with food and drink  Doxycycline should be taken with food to prevent nausea. Do not lay down for 30 minutes after taking. Be cautious with sun exposure and use good sun  protection while on this medication. Pregnant women should not take this medication.    Other Related Procedures Ambulatory referral to General Surgery  Reordered Medications doxycycline (VIBRA-TABS) 100 MG tablet  Epidermal cyst R cheek Discussed excising.  Will schedule for surgery  Hidradenitis suppurativa -persistent problems for many years. groin and buttocks And related to cysts and boils and current buttocks and cheek lesions.  Discussed Humira, info given,  Will give patient order for labs and if he decides to pursue he will advise [place] Reviewed risks of biologics including immunosuppression, infections, injection site reaction, and failure to improve condition. Goal is control of skin condition, not cure.  Some older biologics such as Humira and Enbrel may slightly increase risk of malignancy and may worsen congestive heart failure. The use of biologics requires long term medication management, including periodic office visits and monitoring of blood work.   Other Related Procedures Comprehensive metabolic panel CBC with Differential/Platelet Hepatitis B surface antibody,qualitative Hepatitis B surface antigen Hepatitis C antibody HIV Antibody (routine testing w rflx) QuantiFERON-TB Gold Plus  Return for Surgery cyst R cheek.   I, SoOthelia PullingRMA, am acting as scribe for DaSarina SerMD .  Documentation: I have reviewed the above documentation for accuracy and completeness, and I agree with the above.  DaSarina SerMD

## 2020-04-26 ENCOUNTER — Other Ambulatory Visit: Payer: Self-pay | Admitting: General Surgery

## 2020-04-26 NOTE — Progress Notes (Signed)
Patient ID: Bruce Graham is a 49 y.o. male.  HPI  The following portions of the patient's history were reviewed and updated as appropriate.  This a new patient is here today for: office visit. Here for evaluation of a cyst on right buttock referred by Dr Nehemiah Massed. He states that he has had leakage of blood for this past two months and has tried gauze and bandage wraps to stop the bleeding. He states it seems to be better today.  No history of trauma to the area.   He has taken Doxycycline without any improvement. Sitting down gives him discomfort and pain.   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     Review of Systems  Constitutional: Negative for chills and fever.  Respiratory: Negative for cough.         Chief Complaint  Patient presents with  . Cyst    Location: right buttock     BP 146/84   Pulse 67   Temp 36.7 C (98.1 F)   Ht 177.8 cm (5' 10" )   Wt (!) 106.6 kg (235 lb)   SpO2 98%   BMI 33.72 kg/m       Past Medical History:  Diagnosis Date  . Abscess of right thigh 06/16/2015  . Bronchitis   . Ear infection 12/13/2017  . GERD (gastroesophageal reflux disease)   . 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 Umbilical hernia 58/02/9832          Past Surgical History:  Procedure Laterality Date  . COLONOSCOPY  03/06/2018  . EGD  03/06/2018  . incision and drainage perirectal abscess  06/16/2015  . knee surgery Left   . UMBILICAL HERNIA REPAIR  12/20/2017   with mesh       Social History          Socioeconomic History  . Marital status: Single    Spouse name: Not on file  . Number of children: Not on file  . Years of education: Not on file  . Highest education level: Not on file  Occupational History  . Not on file  Tobacco Use  . Smoking status: Current Every Day Smoker    Packs/day: 0.50    Years: 30.00     Pack years: 15.00  . Smokeless tobacco: Never Used  Substance and Sexual Activity  . Alcohol use: Yes  . Drug use: No  . Sexual activity: Not on file  Other Topics Concern  . Not on file  Social History Narrative  . Not on file   Social Determinants of Health   Financial Resource Strain: Not on file  Food Insecurity: Not on file  Transportation Needs: Not on file       No Known Allergies  Current Medications  No current outpatient medications on file.   No current facility-administered medications for this visit.           Family History  Problem Relation Age of Onset  . No Known Problems Mother   . Lung cancer Father   . Heart disease Father      Labs and Radiology:    March 06, 2018 colonoscopy: Extensive diverticulosis without diverticulitis.  Rectum was normal.  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  January 13, 2018 laboratory: WBC 3.8 - 10.6 K/uL 11.8High   RBC 4.40 - 5.90 MIL/uL 5.29   Hemoglobin 13.0 -  18.0 g/dL 15.2   HCT 40 - 52 % 45.0   MCV 80.0 - 100.0 fL 84.9   MCH 26.0 - 34.0 pg 28.8   MCHC 32.0 - 36.0 g/dL 33.9   RDW 11.5 - 14.5 % 14.2   Platelets 150 - 440 K/uL 316   Neutrophils Relative % % 61   Neutro Abs 1.4 - 6.5 K/uL 7.3High   Lymphocytes Relative % 23   Lymphs Abs 1.0 - 3.6 K/uL 2.7   Monocytes Relative % 13   Monocytes Absolute 0.2 - 1.0 K/uL 1.5High   Eosinophils Relative % 2   Eosinophils Absolute 0.0 - 0.7 K/uL 0.2   Basophils Relative % 1      135 - 145 mmol/L 137   Potassium 3.5 - 5.1 mmol/L 4.8   Chloride 98 - 111 mmol/L 101   Comment: Please note change in reference range.  CO2 22 - 32 mmol/L 24   Glucose, Bld 70 - 99 mg/dL 111High   Comment: Please note change in reference range.  BUN 6 - 20 mg/dL 10   Comment: Please note change in reference range.  Creatinine, Ser 0.61 - 1.24 mg/dL 0.99   Calcium 8.9 - 10.3 mg/dL 8.9   Total  Protein 6.5 - 8.1 g/dL 8.4High   Albumin 3.5 - 5.0 g/dL 3.9   AST 15 - 41 U/L 24   ALT 0 - 44 U/L 19   Comment: Please note change in reference range.  Alkaline Phosphatase 38 - 126 U/L 84   Total Bilirubin 0.3 - 1.2 mg/dL 0.6   GFR calc non Af Amer >60 mL/min >60    Operative report of June 16, 2015 by Phoebe Perch, MD was reviewed.  This was for incision and drainage of a right anterior medial thigh abscess.  Scarring in the groin creases, medial thighs and periscrotal area was reported.  It appears this area took about 10 months to heal.     Objective:   Physical Exam Constitutional:      Appearance: Normal appearance.  Skin:    General: Skin is warm and dry.          Comments: 4-5 cm area in the right upper gluteal area about 5 cm away (superior) from the midline central pit.  Perineal skin appears normal.  Neurological:     Mental Status: He is alert and oriented to person, place, and time.  Psychiatric:        Mood and Affect: Mood normal.        Behavior: Behavior normal.   Dermatology record of April 02, 2020 reviewed.     Assessment:     Simple gluteal abscess versus tunneled pilonidal process.  Questionable compliance with medications as he is presently not on a PPI with known Barrett's changes.    Plan:     Operative debridement will be appropriate.  The area is too extensive for appropriate analgesia with local anesthesia alone.  Anticipate an outpatient procedure.  Prone position.    The patient was advised that he will have drainage for several days and need to keep the area covered with sanitary pads.  Entered by Karie Fetch, RN, acting as a scribe for Dr. Hervey Ard, MD.  The documentation recorded by the scribe accurately reflects the service I personally performed and the decisions made by me.   Robert Bellow, MD FACS

## 2020-04-29 ENCOUNTER — Encounter: Payer: Self-pay | Admitting: General Surgery

## 2020-04-29 ENCOUNTER — Other Ambulatory Visit: Payer: Self-pay

## 2020-04-29 ENCOUNTER — Encounter
Admission: RE | Admit: 2020-04-29 | Discharge: 2020-04-29 | Disposition: A | Discharge status: Home / Self Care | Payer: Managed Care, Other (non HMO) | Source: Ambulatory Visit | Attending: General Surgery | Admitting: General Surgery

## 2020-04-29 NOTE — Patient Instructions (Signed)
Your procedure is scheduled on:05-02-20 FRIDAY Report to Day Surgery on the 2nd floor of the [facility]. To find out your arrival time, please call [phone] between 1PM - 3PM on:05-01-20 THURSDAY  REMEMBER: Instructions that are not followed completely may result in serious medical risk, up to and including death; or upon the discretion of your surgeon and anesthesiologist your surgery may need to be rescheduled.  Do not eat food after midnight the night before surgery.  No gum chewing, lozengers or hard candies.  You may however, drink CLEAR liquids up to 2 hours before you are scheduled to arrive for your surgery. Do not drink anything within 2 hours of your scheduled arrival time.  Clear liquids include: - water  - apple juice without pulp - gatorade (not RED, PURPLE, OR BLUE) - black coffee or tea (Do NOT add milk or creamers to the coffee or tea) Do NOT drink anything that is not on this list.  TAKE THESE MEDICATIONS THE MORNING OF SURGERY WITH A SIP OF WATER: -NONE  One week prior to surgery: Stop Anti-inflammatories (NSAIDS) such as Advil, Aleve, Ibuprofen, Motrin, Naproxen, Naprosyn and Aspirin based products such as Excedrin, Goodys Powder, BC Powder-OK TO TAKE TYLENOL IF NEEDED  Stop ANY OVER THE COUNTER supplements until after surgery.   No Alcohol for 24 hours before or after surgery.  No Smoking including e-cigarettes for 24 hours prior to surgery.  No chewable tobacco products for at least 6 hours prior to surgery.  No nicotine patches on the day of surgery.  Do not use any "recreational" drugs for at least a week prior to your surgery.  Please be advised that the combination of cocaine and anesthesia may have negative outcomes, up to and including death. If you test positive for cocaine, your surgery will be cancelled.  On the morning of surgery brush your teeth with toothpaste and water, you may rinse your mouth with mouthwash if you wish. Do not swallow  any toothpaste or mouthwash.  Do not wear jewelry, make-up, hairpins, clips or nail polish.  Do not wear lotions, powders, or perfumes.   Do not shave 48 hours prior to surgery.   Contact lenses, hearing aids and dentures may not be worn into surgery.  Do not bring valuables to the hospital. [facility] is not responsible for any missing/lost belongings or valuables.   Use CHG Soap as directed on instruction sheet.  Notify your doctor if there is any change in your medical condition (cold, fever, infection).  Wear comfortable clothing (specific to your surgery type) to the hospital.  Plan for stool softeners for home use; pain medications have a tendency to cause constipation. You can also help prevent constipation by eating foods high in fiber such as fruits and vegetables and drinking plenty of fluids as your diet allows.  After surgery, you can help prevent lung complications by doing breathing exercises.  Take deep breaths and cough every 1-2 hours. Your doctor may order a device called an Incentive Spirometer to help you take deep breaths. When coughing or sneezing, hold a pillow firmly against your incision with both hands. This is called "splinting." Doing this helps protect your incision. It also decreases belly discomfort.  If you are being admitted to the hospital overnight, leave your suitcase in the car. After surgery it may be brought to your room.  If you are being discharged the day of surgery, you will not be allowed to drive home. You will need a  responsible adult (18 years or older) to drive you home and stay with you that night.   If you are taking public transportation, you will need to have a responsible adult (18 years or older) with you. Please confirm with your physician that it is acceptable to use public transportation.   Please call the [facility] at [phone] if you have any questions about these instructions.  Visitation  Policy:  Patients undergoing a surgery or procedure may have one family member or support person with them as long as that person is not COVID-19 positive or experiencing its symptoms.  That person may remain in the waiting area during the procedure.  Inpatient Visitation Update:   In an effort to ensure the safety of our team members and our patients, we are implementing a change to our visitation policy:  Effective Monday, Aug. 9, at 7 a.m., inpatients will be allowed one support person.  o The support person may change daily.  o The support person must pass our screening, gel in and out, and wear a mask at all times, including in the patient's room.  o Patients must also wear a mask when staff or their support person are in the room.  o Masking is required regardless of vaccination status.  Systemwide, no visitors 17 or younger.

## 2020-05-01 ENCOUNTER — Other Ambulatory Visit
Admission: RE | Admit: 2020-05-01 | Discharge: 2020-05-01 | Disposition: A | Discharge status: Home / Self Care | Payer: Managed Care, Other (non HMO) | Source: Ambulatory Visit | Attending: General Surgery | Admitting: General Surgery

## 2020-05-01 ENCOUNTER — Other Ambulatory Visit: Payer: Self-pay

## 2020-05-01 DIAGNOSIS — Z20822 Contact with and (suspected) exposure to covid-19: Secondary | ICD-10-CM | POA: Insufficient documentation

## 2020-05-01 DIAGNOSIS — Z01812 Encounter for preprocedural laboratory examination: Secondary | ICD-10-CM | POA: Diagnosis present

## 2020-05-01 LAB — BASIC METABOLIC PANEL
Anion gap: 9 (ref 5–15)
BUN: 11 mg/dL (ref 6–20)
CO2: 25 mmol/L (ref 22–32)
Calcium: 9.3 mg/dL (ref 8.9–10.3)
Chloride: 101 mmol/L (ref 98–111)
Creatinine, Ser: 1.05 mg/dL (ref 0.61–1.24)
GFR, Estimated: >60 mL/min (ref >60)
Glucose, Bld: 102 mg/dL — ABNORMAL HIGH (ref 70–99)
Potassium: 4.1 mmol/L (ref 3.5–5.1)
Sodium: 135 mmol/L (ref 135–145)

## 2020-05-01 LAB — CBC WITH DIFFERENTIAL/PLATELET
Abs Immature Granulocytes: 0.04 10*3/uL (ref 0.00–0.07)
Basophils Absolute: 0 10*3/uL (ref 0.0–0.1)
Basophils Relative: 0 %
Eosinophils Absolute: 0.3 10*3/uL (ref 0.0–0.5)
Eosinophils Relative: 3 %
HCT: 49 % (ref 39.0–52.0)
Hemoglobin: 16.4 g/dL (ref 13.0–17.0)
Immature Granulocytes: 0 %
Lymphocytes Relative: 30 %
Lymphs Abs: 3.5 10*3/uL (ref 0.7–4.0)
MCH: 28.5 pg (ref 26.0–34.0)
MCHC: 33.5 g/dL (ref 30.0–36.0)
MCV: 85.2 fL (ref 80.0–100.0)
Monocytes Absolute: 0.9 10*3/uL (ref 0.1–1.0)
Monocytes Relative: 8 %
Neutro Abs: 7.1 10*3/uL (ref 1.7–7.7)
Neutrophils Relative %: 59 %
Platelets: 363 10*3/uL (ref 150–400)
RBC: 5.75 MIL/uL (ref 4.22–5.81)
RDW: 14.5 % (ref 11.5–15.5)
WBC: 12 10*3/uL — ABNORMAL HIGH (ref 4.0–10.5)
nRBC: 0 % (ref 0.0–0.2)

## 2020-05-01 LAB — SARS CORONAVIRUS 2 (TAT 6-24 HRS): SARS Coronavirus 2: NEGATIVE

## 2020-05-01 LAB — HEMOGLOBIN A1C
Hgb A1c MFr Bld: 5.3 % (ref 4.8–5.6)
Mean Plasma Glucose: 105.41 mg/dL

## 2020-05-01 MED ORDER — ORAL CARE MOUTH RINSE: 15.0000 mL | Freq: Once | OROMUCOSAL | Status: DC

## 2020-05-01 MED ORDER — LACTATED RINGERS IV SOLN: INTRAVENOUS | Status: DC

## 2020-05-01 MED ORDER — CHLORHEXIDINE GLUCONATE 0.12 % MT SOLN: 15.0000 mL | Freq: Once | OROMUCOSAL | Status: DC

## 2020-05-01 MED ORDER — FAMOTIDINE 20 MG PO TABS: 20.0000 mg | ORAL_TABLET | Freq: Once | ORAL | Status: AC

## 2020-05-02 ENCOUNTER — Ambulatory Visit: Payer: Managed Care, Other (non HMO) | Admitting: Registered Nurse

## 2020-05-02 ENCOUNTER — Encounter: Payer: Self-pay | Admitting: General Surgery

## 2020-05-02 ENCOUNTER — Encounter
Admission: RE | Disposition: A | Discharge status: Home / Self Care | Payer: Self-pay | Source: Home / Self Care | Attending: General Surgery

## 2020-05-02 ENCOUNTER — Other Ambulatory Visit: Payer: Self-pay

## 2020-05-02 ENCOUNTER — Ambulatory Visit
Admission: RE | Admit: 2020-05-02 | Discharge: 2020-05-02 | Disposition: A | Discharge status: Home / Self Care | Payer: Managed Care, Other (non HMO) | Attending: General Surgery | Admitting: General Surgery

## 2020-05-02 DIAGNOSIS — F17210 Nicotine dependence, cigarettes, uncomplicated: Secondary | ICD-10-CM | POA: Diagnosis not present

## 2020-05-02 DIAGNOSIS — L0231 Cutaneous abscess of buttock: Secondary | ICD-10-CM | POA: Insufficient documentation

## 2020-05-02 HISTORY — PX: INCISION AND DRAINAGE ABSCESS: SHX5864

## 2020-05-02 HISTORY — DX: Barrett's esophagus without dysplasia: K22.70

## 2020-05-02 HISTORY — DX: Other complications of anesthesia, initial encounter: T88.59XA

## 2020-05-02 HISTORY — PX: PILONIDAL CYST EXCISION: SHX744

## 2020-05-02 HISTORY — DX: Family history of other specified conditions: Z84.89

## 2020-05-02 SURGERY — INCISION AND DRAINAGE, ABSCESS
Anesthesia: General

## 2020-05-02 MED ORDER — FAMOTIDINE 20 MG PO TABS
ORAL_TABLET | ORAL | Status: AC
  Administered 2020-05-02: 20 mg via ORAL
  Filled 2020-05-02: qty 1

## 2020-05-02 MED ORDER — MIDAZOLAM HCL 2 MG/2ML IJ SOLN
INTRAMUSCULAR | Status: AC
  Filled 2020-05-02: qty 2

## 2020-05-02 MED ORDER — DEXAMETHASONE SODIUM PHOSPHATE 10 MG/ML IJ SOLN
INTRAMUSCULAR | Status: AC
  Filled 2020-05-02: qty 1

## 2020-05-02 MED ORDER — KETOROLAC TROMETHAMINE 30 MG/ML IJ SOLN
INTRAMUSCULAR | Status: AC
  Filled 2020-05-02: qty 1

## 2020-05-02 MED ORDER — LIDOCAINE HCL (PF) 2 % IJ SOLN
INTRAMUSCULAR | Status: AC
  Filled 2020-05-02: qty 5

## 2020-05-02 MED ORDER — ALBUTEROL SULFATE HFA 108 (90 BASE) MCG/ACT IN AERS
INHALATION_SPRAY | RESPIRATORY_TRACT | Status: DC | PRN
  Administered 2020-05-02: 4 via RESPIRATORY_TRACT

## 2020-05-02 MED ORDER — SUGAMMADEX SODIUM 200 MG/2ML IV SOLN
INTRAVENOUS | Status: DC | PRN
  Administered 2020-05-02: 200 mg via INTRAVENOUS

## 2020-05-02 MED ORDER — FENTANYL CITRATE (PF) 100 MCG/2ML IJ SOLN: 25.0000 ug | INTRAMUSCULAR | Status: DC | PRN

## 2020-05-02 MED ORDER — DEXMEDETOMIDINE HCL 200 MCG/2ML IV SOLN
INTRAVENOUS | Status: DC | PRN
  Administered 2020-05-02: 20 ug via INTRAVENOUS

## 2020-05-02 MED ORDER — OXYCODONE HCL 5 MG PO TABS
ORAL_TABLET | ORAL | Status: AC
  Filled 2020-05-02: qty 1

## 2020-05-02 MED ORDER — ONDANSETRON HCL 4 MG/2ML IJ SOLN
INTRAMUSCULAR | Status: AC
  Filled 2020-05-02: qty 2

## 2020-05-02 MED ORDER — ROCURONIUM BROMIDE 10 MG/ML (PF) SYRINGE
PREFILLED_SYRINGE | INTRAVENOUS | Status: AC
  Filled 2020-05-02: qty 10

## 2020-05-02 MED ORDER — SODIUM CHLORIDE FLUSH 0.9 % IV SOLN
INTRAVENOUS | Status: AC
  Filled 2020-05-02: qty 10

## 2020-05-02 MED ORDER — ACETAMINOPHEN 10 MG/ML IV SOLN
INTRAVENOUS | Status: DC | PRN
  Administered 2020-05-02: 1000 mg via INTRAVENOUS

## 2020-05-02 MED ORDER — FENTANYL CITRATE (PF) 100 MCG/2ML IJ SOLN
INTRAMUSCULAR | Status: DC | PRN
  Administered 2020-05-02 (×2): 50 ug via INTRAVENOUS

## 2020-05-02 MED ORDER — ONDANSETRON HCL 4 MG/2ML IJ SOLN
INTRAMUSCULAR | Status: DC | PRN
  Administered 2020-05-02: 4 mg via INTRAVENOUS

## 2020-05-02 MED ORDER — MIDAZOLAM HCL 2 MG/2ML IJ SOLN
INTRAMUSCULAR | Status: DC | PRN
  Administered 2020-05-02: 2 mg via INTRAVENOUS

## 2020-05-02 MED ORDER — PROMETHAZINE HCL 25 MG/ML IJ SOLN: 6.2500 mg | Freq: Once | INTRAMUSCULAR | Status: AC

## 2020-05-02 MED ORDER — ALBUTEROL SULFATE HFA 108 (90 BASE) MCG/ACT IN AERS
INHALATION_SPRAY | RESPIRATORY_TRACT | Status: AC
  Filled 2020-05-02: qty 13.4

## 2020-05-02 MED ORDER — GLYCOPYRROLATE 0.2 MG/ML IJ SOLN
INTRAMUSCULAR | Status: AC
  Filled 2020-05-02: qty 1

## 2020-05-02 MED ORDER — PROPOFOL 10 MG/ML IV BOLUS
INTRAVENOUS | Status: AC
  Filled 2020-05-02: qty 20

## 2020-05-02 MED ORDER — ACETAMINOPHEN 10 MG/ML IV SOLN
INTRAVENOUS | Status: AC
  Filled 2020-05-02: qty 100

## 2020-05-02 MED ORDER — DEXAMETHASONE SODIUM PHOSPHATE 10 MG/ML IJ SOLN
INTRAMUSCULAR | Status: DC | PRN
  Administered 2020-05-02: 6 mg via INTRAVENOUS

## 2020-05-02 MED ORDER — PROPOFOL 10 MG/ML IV BOLUS
INTRAVENOUS | Status: DC | PRN
  Administered 2020-05-02: 180 mg via INTRAVENOUS
  Administered 2020-05-02: 20 mg via INTRAVENOUS

## 2020-05-02 MED ORDER — CEFAZOLIN SODIUM-DEXTROSE 2-3 GM-%(50ML) IV SOLR
INTRAVENOUS | Status: DC | PRN
  Administered 2020-05-02: 2 g via INTRAVENOUS

## 2020-05-02 MED ORDER — OXYCODONE HCL 5 MG PO TABS
5.0000 mg | ORAL_TABLET | Freq: Once | ORAL | Status: AC | PRN
  Administered 2020-05-02: 5 mg via ORAL

## 2020-05-02 MED ORDER — HYDROCODONE-ACETAMINOPHEN 5-325 MG PO TABS: 1.0000 | ORAL_TABLET | ORAL | 0 refills | Status: DC | PRN

## 2020-05-02 MED ORDER — FENTANYL CITRATE (PF) 100 MCG/2ML IJ SOLN
INTRAMUSCULAR | Status: AC
  Filled 2020-05-02: qty 2

## 2020-05-02 MED ORDER — CEFAZOLIN SODIUM 1 G IJ SOLR
INTRAMUSCULAR | Status: AC
  Filled 2020-05-02: qty 20

## 2020-05-02 MED ORDER — PROMETHAZINE HCL 25 MG/ML IJ SOLN
INTRAMUSCULAR | Status: AC
  Administered 2020-05-02: 6.25 mg via INTRAVENOUS
  Filled 2020-05-02: qty 1

## 2020-05-02 MED ORDER — ROCURONIUM BROMIDE 100 MG/10ML IV SOLN
INTRAVENOUS | Status: DC | PRN
  Administered 2020-05-02: 50 mg via INTRAVENOUS

## 2020-05-02 MED ORDER — OXYCODONE HCL 5 MG/5ML PO SOLN: 5.0000 mg | Freq: Once | ORAL | Status: AC | PRN

## 2020-05-02 MED ORDER — DEXMEDETOMIDINE (PRECEDEX) IN NS 20 MCG/5ML (4 MCG/ML) IV SYRINGE
PREFILLED_SYRINGE | INTRAVENOUS | Status: AC
  Filled 2020-05-02: qty 5

## 2020-05-02 MED ORDER — KETAMINE HCL 50 MG/ML IJ SOLN
INTRAMUSCULAR | Status: DC | PRN
  Administered 2020-05-02: 20 mg via INTRAMUSCULAR

## 2020-05-02 SURGICAL SUPPLY — 46 items
APL PRP STRL LF DISP 70% ISPRP (MISCELLANEOUS) ×1
BLADE SURG 11 STRL SS SAFETY (MISCELLANEOUS) ×3 IMPLANT
BLADE SURG 15 STRL SS SAFETY (BLADE) ×3 IMPLANT
BULB RESERV EVAC DRAIN JP 100C (MISCELLANEOUS) IMPLANT
CANISTER SUCT 1200ML W/VALVE (MISCELLANEOUS) ×3 IMPLANT
CHLORAPREP W/TINT 26 (MISCELLANEOUS) ×3 IMPLANT
CLOSURE WOUND 1/2 X4 (GAUZE/BANDAGES/DRESSINGS) ×1
COVER WAND RF STERILE (DRAPES) ×3 IMPLANT
DRAIN CHANNEL JP 15F RND 16 (MISCELLANEOUS) IMPLANT
DRAPE LAPAROTOMY 100X77 ABD (DRAPES) ×3 IMPLANT
DRSG TEGADERM 4X4.75 (GAUZE/BANDAGES/DRESSINGS) ×3 IMPLANT
DRSG TELFA 3X8 NADH (GAUZE/BANDAGES/DRESSINGS) ×3 IMPLANT
DRSG TELFA 4X3 1S NADH ST (GAUZE/BANDAGES/DRESSINGS) ×3 IMPLANT
ELECT CAUTERY BLADE TIP 2.5 (TIP) ×3
ELECT REM PT RETURN 9FT ADLT (ELECTROSURGICAL) ×3
ELECTRODE CAUTERY BLDE TIP 2.5 (TIP) ×1 IMPLANT
ELECTRODE REM PT RTRN 9FT ADLT (ELECTROSURGICAL) ×1 IMPLANT
GLOVE BIO SURGEON STRL SZ7.5 (GLOVE) ×3 IMPLANT
GLOVE INDICATOR 8.0 STRL GRN (GLOVE) ×3 IMPLANT
GOWN STRL REUS W/ TWL LRG LVL3 (GOWN DISPOSABLE) ×2 IMPLANT
GOWN STRL REUS W/TWL LRG LVL3 (GOWN DISPOSABLE) ×6
KIT TURNOVER KIT A (KITS) ×3 IMPLANT
LABEL OR SOLS (LABEL) ×3 IMPLANT
MANIFOLD NEPTUNE II (INSTRUMENTS) ×3 IMPLANT
NDL HYPO 25X1 1.5 SAFETY (NEEDLE) ×1 IMPLANT
NEEDLE HYPO 22GX1.5 SAFETY (NEEDLE) ×3 IMPLANT
NEEDLE HYPO 25X1 1.5 SAFETY (NEEDLE) ×3 IMPLANT
NS IRRIG 500ML POUR BTL (IV SOLUTION) ×3 IMPLANT
PACK BASIN MINOR (MISCELLANEOUS) ×3 IMPLANT
PAD DRESSING TELFA 3X8 NADH (GAUZE/BANDAGES/DRESSINGS) ×1 IMPLANT
SOL PREP PVP 2OZ (MISCELLANEOUS) ×3
SOLUTION PREP PVP 2OZ (MISCELLANEOUS) ×1 IMPLANT
STRIP CLOSURE SKIN 1/2X4 (GAUZE/BANDAGES/DRESSINGS) ×2 IMPLANT
SUT ETHILON 3-0 FS-10 30 BLK (SUTURE)
SUT PROLENE 4-0 (SUTURE) ×3
SUT PROLENE 4-0 RB1 30XMFL BLU (SUTURE) ×1
SUT VIC AB 2-0 CT1 (SUTURE) ×3 IMPLANT
SUT VIC AB 2-0 CT2 27 (SUTURE) ×3 IMPLANT
SUT VIC AB 3-0 SH 27 (SUTURE) ×6
SUT VIC AB 3-0 SH 27X BRD (SUTURE) ×2 IMPLANT
SUT VICRYL 3-0 27IN (SUTURE) ×3 IMPLANT
SUT VICRYL+ 3-0 144IN (SUTURE) ×3 IMPLANT
SUTURE EHLN 3-0 FS-10 30 BLK (SUTURE) IMPLANT
SUTURE PROLEN 4-0 RB1 30XMFL (SUTURE) ×1 IMPLANT
SWABSTK COMLB BENZOIN TINCTURE (MISCELLANEOUS) ×3 IMPLANT
SYR 10ML LL (SYRINGE) ×3 IMPLANT

## 2020-05-02 NOTE — Anesthesia Preprocedure Evaluation (Signed)
Anesthesia Evaluation  Patient identified by MRN, date of birth, ID band Patient awake    Reviewed: Allergy & Precautions, H&P , NPO status , Patient's Chart, lab work & pertinent test results  History of Anesthesia Complications (+) PROLONGED EMERGENCE, Family history of anesthesia reaction and history of anesthetic complications  Airway Mallampati: III  TM Distance: <3 FB Neck ROM: full    Dental  (+) Chipped, Poor Dentition, Missing   Pulmonary neg shortness of breath, Current Smoker and Patient abstained from smoking.,    Pulmonary exam normal        Cardiovascular Exercise Tolerance: Good (-) angina(-) Past MI and (-) DOE negative cardio ROS Normal cardiovascular exam     Neuro/Psych negative neurological ROS  negative psych ROS   GI/Hepatic Neg liver ROS, GERD  Medicated and Controlled,  Endo/Other  negative endocrine ROS  Renal/GU      Musculoskeletal   Abdominal   Peds  Hematology negative hematology ROS (+)   Anesthesia Other Findings Past Medical History: 06/16/2015: Abscess of right thigh No date: Barrett esophagus No date: Bronchitis No date: Complication of anesthesia     Comment:  hard to wake up after hernia surgery 12/13/2017: Ear infection     Comment:  taking amoxicillin No date: Family history of adverse reaction to anesthesia     Comment:  mom hard to wake up  No date: GERD (gastroesophageal reflux disease) No date: Hydradenitis     Comment:  Left leg 08/07/2015: Suppurative hidradenitis 9/93/5701: Umbilical hernia without obstruction and without gangrene  Past Surgical History: 03/06/2018: COLONOSCOPY WITH PROPOFOL; N/A     Comment:  Procedure: COLONOSCOPY WITH PROPOFOL;  Surgeon: Lin Landsman, MD;  Location: [facility] ENDOSCOPY;  Service:               Gastroenterology;  Laterality: N/A; 03/06/2018: ESOPHAGOGASTRODUODENOSCOPY (EGD) WITH PROPOFOL; N/A     Comment:   Procedure: ESOPHAGOGASTRODUODENOSCOPY (EGD) WITH               PROPOFOL;  Surgeon: Lin Landsman, MD;  Location:               [facility] ENDOSCOPY;  Service: Gastroenterology;  Laterality:               N/A; No date: HERNIA REPAIR 06/16/2015: INCISION AND DRAINAGE PERIRECTAL ABSCESS; Right     Comment:  Procedure: IRRIGATION AND DEBRIDEMENT right inner thigh               ABSCESS;  Surgeon: Florene Glen, MD;  Location: [facility]               ORS;  Service: General;  Laterality: Right; 12/20/2017: INSERTION OF MESH; N/A     Comment:  Procedure: INSERTION OF MESH;  Surgeon: [name], MD;  Location: [facility] ORS;  Service: General;                Laterality: N/A; No date: KNEE SURGERY; Left 7/79/3903: UMBILICAL HERNIA REPAIR; N/A     Comment:  Procedure: HERNIA REPAIR UMBILICAL ADULT;  Surgeon:               [name], MD;  Location: [facility] ORS;  Service:               General;  Laterality: N/A;  BMI  Body Mass Index: 33.72 kg/m      Reproductive/Obstetrics negative OB ROS                             Anesthesia Physical Anesthesia Plan  ASA: II  Anesthesia Plan: General ETT   Post-op Pain Management:    Induction: Intravenous  PONV Risk Score and Plan: Ondansetron, Dexamethasone, Midazolam and Treatment may vary due to age or medical condition  Airway Management Planned: Oral ETT  Additional Equipment:   Intra-op Plan:   Post-operative Plan: Extubation in OR  Informed Consent: I have reviewed the patients History and Physical, chart, labs and discussed the procedure including the risks, benefits and alternatives for the proposed anesthesia with the patient or authorized representative who has indicated his/her understanding and acceptance.     Dental Advisory Given  Plan Discussed with: Anesthesiologist, CRNA and Surgeon  Anesthesia Plan Comments: (Patient consented for risks of anesthesia including but not  limited to:  - adverse reactions to medications - damage to eyes, teeth, lips or other oral mucosa - nerve damage due to positioning  - sore throat or hoarseness - Damage to heart, brain, nerves, lungs, other parts of body or loss of life  Patient voiced understanding.)        Anesthesia Quick Evaluation

## 2020-05-02 NOTE — Op Note (Signed)
Preoperative diagnosis: Right gluteal abscess.  Postoperative diagnosis: Same  Operative procedure: Debridement, placement drain.  Operating surgeon: Hervey Ard, MD  Anesthesia: General endotracheal, Marcaine 0.5% with 1: 200,000 units of epinephrine, 30 cc.  Estimated blood loss: 30 cc.  Clinical note: This 49 year old male with a history of hidradenitis involving the groin and thigh has had a abscess develop in the right gluteal area.  It was unclear whether this was related to a pilonidal cyst.  He was brought to the operating room for debridement.  Operative note: The patient underwent general endotracheal anesthesia then was rolled to the prone position.  The buttocks were taped apart and the area cleansed with Betadine solution and draped.  The abscess which measured about 5 cm in diameter was opened with creamy pale green pus evident.  Culture for aerobic and anaerobic organisms sent.  Ancef administered at this time, 2 g intravenously.  The area was then debrided with a curette.  There were 2 small pits within about 3 cm of the area one medially and one inferior medially.  Sharp dissection was used to elevate the skin flaps to this area to make sure no additional communication was noted.  1/4 inch Penrose drain was brought out through the inferior lesion and anchored into position with a 3-0 nylon suture x2.  The wound was irrigated, Telfa pad packed into the wound followed by an ABD pad.  Patient tolerated the procedure well and was taken to recovery in stable condition.

## 2020-05-02 NOTE — Transfer of Care (Signed)
Immediate Anesthesia Transfer of Care Note  Patient: Bruce Graham  Procedure(s) Performed: INCISION AND DRAINAGE ABSCESS (N/A ) CYST EXCISION PILONIDAL EXTENSIVE (N/A )  Patient Location: PACU  Anesthesia Type:General  Level of Consciousness: sedated  Airway & Oxygen Therapy: Patient Spontanous Breathing and Patient connected to face mask oxygen  Post-op Assessment: Report given to RN and Post -op Vital signs reviewed and stable  Post vital signs: Reviewed and stable  Last Vitals:  Vitals Value Taken Time  BP 121/85 05/02/20 1250  Temp 35.9 C 05/02/20 1251  Pulse 91 05/02/20 1251  Resp 19 05/02/20 1251  SpO2 94 % 05/02/20 1251  Vitals shown include unvalidated device data.  Last Pain:  Vitals:   05/02/20 1250  TempSrc:   PainSc: 0-No pain         Complications: No complications documented.

## 2020-05-02 NOTE — OR Nursing (Signed)
Drsg saturated with sang. drng on arrival to postop; Dr. Bary Castilla notified of same by Michael Boston, RN.  Drsg changed by Michael Boston, RN as instructed by MD.  OK to discharge patient to home without Dr. Dwyane Luo visit to postop per Michael Boston, RN.

## 2020-05-02 NOTE — Anesthesia Procedure Notes (Signed)
Procedure Name: Intubation Date/Time: 05/02/2020 12:00 PM Performed by: Lia Foyer, CRNA Pre-anesthesia Checklist: Patient identified, Emergency Drugs available, Suction available and Patient being monitored Patient Re-evaluated:Patient Re-evaluated prior to induction Oxygen Delivery Method: Circle system utilized Preoxygenation: Pre-oxygenation with 100% oxygen Induction Type: IV induction Ventilation: Mask ventilation without difficulty Tube type: Oral Tube size: 7.0 mm Number of attempts: 1 Airway Equipment and Method: Stylet,  Oral airway and Video-laryngoscopy Placement Confirmation: ETT inserted through vocal cords under direct vision,  positive ETCO2 and breath sounds checked- equal and bilateral Secured at: 21 cm Tube secured with: Tape Dental Injury: Teeth and Oropharynx as per pre-operative assessment

## 2020-05-02 NOTE — Discharge Instructions (Signed)

## 2020-05-02 NOTE — H&P (Signed)
Bruce Graham [id number] September 15, 1970     HPI:  No interval change since OV noted earlier.   Medications Prior to Admission  Medication Sig Dispense Refill Last Dose  . calcium carbonate (TUMS - DOSED IN MG ELEMENTAL CALCIUM) 500 MG chewable tablet Chew 1 tablet by mouth as needed for indigestion or heartburn.    05/01/2020 at Unknown time  . doxycycline (VIBRA-TABS) 100 MG tablet Take 1 tablet (100 mg total) by mouth in the morning and at bedtime. With food and plenty of fluid (Patient not taking: Reported on 04/24/2020) 60 tablet 1 04/18/2020 at Unknown time  . omeprazole (PRILOSEC) 40 MG capsule TAKE 1 CAPSULE (40 MG TOTAL) BY MOUTH 2 (TWO) TIMES DAILY BEFORE A MEAL. (Patient not taking: Reported on 04/24/2020) 30 capsule 1 Not Taking at Unknown time   No Known Allergies Past Medical History:  Diagnosis Date  . Abscess of right thigh 06/16/2015  . Barrett esophagus   . Bronchitis   . Complication of anesthesia    hard to wake up after hernia surgery  . Ear infection 12/13/2017   taking amoxicillin  . Family history of adverse reaction to anesthesia    mom hard to wake up   . GERD (gastroesophageal reflux disease)   . Hydradenitis    Left leg  . 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 Umbilical hernia without obstruction and without gangrene 11/22/2017   Past Surgical History:  Procedure Laterality Date  . COLONOSCOPY WITH PROPOFOL N/A 03/06/2018   Procedure: COLONOSCOPY WITH PROPOFOL;  Surgeon: Lin Landsman, MD;  [facility] ENDOSCOPY;  Service: Gastroenterology;  Laterality: N/A;  . ESOPHAGOGASTRODUODENOSCOPY (EGD) WITH PROPOFOL N/A 03/06/2018   Procedure: ESOPHAGOGASTRODUODENOSCOPY (EGD) WITH PROPOFOL;  Surgeon: Lin Landsman, MD;  Location: [facility] ENDOSCOPY;  Service: Gastroenterology;  Laterality: N/A;  . HERNIA REPAIR    . INCISION AND DRAINAGE PERIRECTAL ABSCESS Right 06/16/2015   Procedure: IRRIGATION AND DEBRIDEMENT right inner thigh ABSCESS;  Surgeon: Florene Glen, MD;  Location: [facility] ORS;  Service: General;  Laterality: Right;  . INSERTION OF MESH N/A 12/20/2017   Procedure: INSERTION OF MESH;  Surgeon: [name], MD;  Location: [facility] ORS;  Service: General;  Laterality: N/A;  . KNEE SURGERY Left   . UMBILICAL HERNIA REPAIR N/A 12/20/2017   Procedure: HERNIA REPAIR UMBILICAL ADULT;  Surgeon: [name], MD;  Location: [facility] ORS;  Service: General;  Laterality: N/A;   Social History   Socioeconomic History  . Marital status: Single    Spouse name: Not on file  . Number of children: Not on file  . Years of education: Not on file  . Highest education level: Not on file  Occupational History  . Not on file  Tobacco Use  . Smoking status: Current Every Day Smoker    Packs/day: 0.50    Years: 30.00    Pack years: 15.00    Types: Cigarettes  . Smokeless tobacco: Never Used  Vaping Use  . Vaping Use: Never used  Substance and Sexual Activity  . Alcohol use: Yes    Comment: social  . Drug use: No  . Sexual activity: Not on file  Other Topics Concern  . Not on file  Social History Narrative  . Not on file   Social Determinants of Health   Financial Resource Strain:   . Difficulty of Paying Living Expenses: Not on file  Food Insecurity:   . Worried About Charity fundraiser in the Last Year: Not on file  .  Ran Out of Food in the Last Year: Not on file  Transportation Needs:   . Lack of Transportation (Medical): Not on file  . Lack of Transportation (Non-Medical): Not on file  Physical Activity:   . Days of Exercise per Week: Not on file  . Minutes of Exercise per Session: Not on file  Stress:   . Feeling of Stress : Not on file  Social Connections:   . Frequency of Communication with Friends and Family: Not on file  . Frequency of Social Gatherings with Friends and Family: Not on file  . Attends Religious Services: Not on file  . Active Member of Clubs or Organizations: Not on file  . Attends Archivist  Meetings: Not on file  . Marital Status: Not on file  Intimate Partner Violence:   . Fear of Current or Ex-Partner: Not on file  . Emotionally Abused: Not on file  . Physically Abused: Not on file  . Sexually Abused: Not on file   Social History   Social History Narrative  . Not on file     ROS: Negative.     PE: HEENT: Negative. Lungs: Clear. Cardio: RR.   Assessment/Plan:  Proceed with planned debridement of gluteal abscess.   [name] [facility] 05/02/2020

## 2020-05-02 NOTE — Anesthesia Postprocedure Evaluation (Signed)
Anesthesia Post Note  Patient: Bruce Graham  Procedure(s) Performed: INCISION AND DRAINAGE ABSCESS (N/A ) CYST EXCISION PILONIDAL EXTENSIVE (N/A )  Patient location during evaluation: PACU Anesthesia Type: General Level of consciousness: awake and alert Pain management: pain level controlled Vital Signs Assessment: post-procedure vital signs reviewed and stable Respiratory status: spontaneous breathing, nonlabored ventilation, respiratory function stable and patient connected to nasal cannula oxygen Cardiovascular status: blood pressure returned to baseline and stable Postop Assessment: no apparent nausea or vomiting Anesthetic complications: no   No complications documented.   Last Vitals:  Vitals:   05/02/20 1250 05/02/20 1251  BP: 121/85 121/85  Pulse: 94 91  Resp: 13 13  Temp:  (!) 35.9 C  SpO2: 94% 95%    Last Pain:  Vitals:   05/02/20 1251  TempSrc:   PainSc: Asleep                 Arita Miss

## 2020-05-03 ENCOUNTER — Encounter: Payer: Self-pay | Admitting: General Surgery

## 2020-05-07 LAB — AEROBIC/ANAEROBIC CULTURE W GRAM STAIN (SURGICAL/DEEP WOUND)

## 2020-05-27 ENCOUNTER — Ambulatory Visit (INDEPENDENT_AMBULATORY_CARE_PROVIDER_SITE_OTHER): Payer: Managed Care, Other (non HMO) | Admitting: Dermatology

## 2020-05-27 ENCOUNTER — Other Ambulatory Visit: Payer: Self-pay

## 2020-05-27 DIAGNOSIS — L720 Epidermal cyst: Secondary | ICD-10-CM

## 2020-05-27 MED ORDER — MUPIROCIN 2 % EX OINT: 1.0000 "application " | TOPICAL_OINTMENT | Freq: Every day | CUTANEOUS | 0 refills | Status: DC

## 2020-05-27 NOTE — Patient Instructions (Signed)

## 2020-05-27 NOTE — Progress Notes (Signed)
Follow-Up Visit   Subjective  Bruce Graham is a 49 y.o. male who presents for the following: Cyst (R cheek and R temple, pt presents for excision).  The following portions of the chart were reviewed this encounter and updated as appropriate:  Tobacco  Allergies  Meds  Problems  Med Hx  Surg Hx  Fam Hx     Review of Systems:  No other skin or systemic complaints except as noted in HPI or Assessment and Plan.  Objective  Well appearing patient in no apparent distress; mood and affect are within normal limits.  A focused examination was performed including face. Relevant physical exam findings are noted in the Assessment and Plan.  Objective    R cheek: Cystic pap 2.1 x 1.1cm  R temple: Cystic pap 1.1cm   Assessment & Plan  Epidermal cyst (2) R cheek; R temple  Cyst vs other x 2, excised today  Start Mupirocin oint qd with dressing changes  Start Targadox 33m 2 po bid with food and drink for 5 days.  Samples given to patient, Lot # [id number] 12/22  Skin excision - R cheek  Lesion length (cm):  2.1 Lesion width (cm):  1.1 Margin per side (cm):  0 Total excision diameter (cm):  2.1 Informed consent: discussed and consent obtained   Timeout: patient name, date of birth, surgical site, and procedure verified   Procedure prep:  Patient was prepped and draped in usual sterile fashion Prep type:  Isopropyl alcohol and povidone-iodine Anesthesia: the lesion was anesthetized in a standard fashion   Anesthetic:  1% lidocaine w/ epinephrine 1-100,000 buffered w/ 8.4% NaHCO3 (3.0cc) Instrument used comment:  15c blade Hemostasis achieved with: pressure   Hemostasis achieved with comment:  Electrocautery Outcome: patient tolerated procedure well with no complications   Post-procedure details: sterile dressing applied and wound care instructions given   Dressing type: bandage and pressure dressing (Mupirocin)    Skin repair - R cheek Complexity:  Complex Final length  (cm):  2.5 Reason for type of repair: reduce tension to allow closure, reduce the risk of dehiscence, infection, and necrosis, reduce subcutaneous dead space and avoid a hematoma, allow closure of the large defect, preserve normal anatomy, preserve normal anatomical and functional relationships and enhance both functionality and cosmetic results   Undermining: area extensively undermined   Undermining comment:  Undermining Defect 1.3cm Subcutaneous layers (deep stitches):  Suture size:  4-0 Suture type: Vicryl (polyglactin 910)   Subcutaneous suture technique: Inverted Dermal. Fine/surface layer approximation (top stitches):  Suture size:  5-0 Suture type: nylon   Stitches: simple running   Suture removal (days):  6 Hemostasis achieved with: pressure Outcome: patient tolerated procedure well with no complications   Post-procedure details: sterile dressing applied and wound care instructions given   Dressing type: bandage, pressure dressing and bacitracin (Mupirocin)    mupirocin ointment (BACTROBAN) 2 % - R cheek  Skin excision - R temple  Lesion length (cm):  1.1 Lesion width (cm):  1.1 Margin per side (cm):  0.1 Total excision diameter (cm):  1.3 Informed consent: discussed and consent obtained   Timeout: patient name, date of birth, surgical site, and procedure verified   Procedure prep:  Patient was prepped and draped in usual sterile fashion Prep type:  Isopropyl alcohol and povidone-iodine Anesthesia: the lesion was anesthetized in a standard fashion   Anesthetic:  1% lidocaine w/ epinephrine 1-100,000 buffered w/ 8.4% NaHCO3 (3.0cc) Instrument used comment:  15c blade Hemostasis achieved with:  pressure   Hemostasis achieved with comment:  Electrocautery Outcome: patient tolerated procedure well with no complications   Post-procedure details: sterile dressing applied and wound care instructions given   Dressing type: bandage and pressure dressing (Mupirocin)    Skin  repair - R temple Complexity:  Complex Final length (cm):  1.5 Reason for type of repair: reduce tension to allow closure, reduce the risk of dehiscence, infection, and necrosis, reduce subcutaneous dead space and avoid a hematoma, allow closure of the large defect, preserve normal anatomy, preserve normal anatomical and functional relationships and enhance both functionality and cosmetic results   Undermining: area extensively undermined   Undermining comment:  Undermining Defect 1.3cm Subcutaneous layers (deep stitches):  Suture size:  4-0 Suture type: Vicryl (polyglactin 910)   Subcutaneous suture technique: Inverted Dermal. Fine/surface layer approximation (top stitches):  Suture size:  5-0 Suture type: nylon   Stitches: simple running   Suture removal (days):  6 Hemostasis achieved with: pressure Outcome: patient tolerated procedure well with no complications   Post-procedure details: sterile dressing applied and wound care instructions given   Dressing type: bandage, pressure dressing and bacitracin (Mupirocin)    Specimen 1 - Surgical pathology Differential Diagnosis: D48.5 Cyst vs other Check Margins: No Cystic pap 2.1 x 1.1cm  Specimen 2 - Surgical pathology Differential Diagnosis: D48.5 Cyst vs other Check Margins: No Cystic pap 1.1cm  Return in about 6 days (around 06/02/2020) for suture removal.  I, [name], RMA, am acting as scribe for Sarina Ser, MD .  Documentation: I have reviewed the above documentation for accuracy and completeness, and I agree with the above.  Sarina Ser, MD

## 2020-05-28 ENCOUNTER — Telehealth: Payer: Self-pay

## 2020-05-28 NOTE — Telephone Encounter (Signed)
Pt doing well after yesterday's surgery.Bruce Graham

## 2020-05-29 ENCOUNTER — Encounter: Payer: Self-pay | Admitting: Dermatology

## 2020-06-03 ENCOUNTER — Ambulatory Visit: Payer: Managed Care, Other (non HMO) | Admitting: Dermatology

## 2020-06-03 ENCOUNTER — Ambulatory Visit (INDEPENDENT_AMBULATORY_CARE_PROVIDER_SITE_OTHER): Payer: Managed Care, Other (non HMO) | Admitting: Dermatology

## 2020-06-03 ENCOUNTER — Other Ambulatory Visit: Payer: Self-pay

## 2020-06-03 DIAGNOSIS — L720 Epidermal cyst: Secondary | ICD-10-CM

## 2020-06-03 NOTE — Progress Notes (Signed)
° °  Follow-Up Visit   Subjective  Bruce Graham is a 49 y.o. male who presents for the following: Follow-up (Suture removal at hair line and cheek).   The following portions of the chart were reviewed this encounter and updated as appropriate:  Tobacco   Allergies   Meds   Problems   Med Hx   Surg Hx   Fam Hx       Objective  Well appearing patient in no apparent distress; mood and affect are within normal limits.  A focused examination was performed including face. Relevant physical exam findings are noted in the Assessment and Plan.  Objective  right cheek, right temple: Healing excision sites.  Assessment & Plan  Epidermal inclusion cyst right cheek, right temple  Biopsy proven Cysts  Wound cleansed, sutures removed, wound cleansed and steri strips applied. Discussed pathology results.   Return if symptoms worsen or fail to improve.   IRuthell Rummage, CMA, am acting as scribe for Sarina Ser, MD.  Documentation: I have reviewed the above documentation for accuracy and completeness, and I agree with the above.  Sarina Ser, MD

## 2020-06-09 ENCOUNTER — Encounter: Payer: Self-pay | Admitting: Dermatology

## 2020-08-18 ENCOUNTER — Encounter: Payer: Self-pay | Admitting: Emergency Medicine

## 2020-08-18 ENCOUNTER — Other Ambulatory Visit: Payer: Self-pay

## 2020-08-18 ENCOUNTER — Ambulatory Visit
Admission: EM | Admit: 2020-08-18 | Discharge: 2020-08-18 | Disposition: A | Discharge status: Home / Self Care | Payer: Managed Care, Other (non HMO) | Attending: Emergency Medicine | Admitting: Emergency Medicine

## 2020-08-18 DIAGNOSIS — H9202 Otalgia, left ear: Secondary | ICD-10-CM | POA: Insufficient documentation

## 2020-08-18 DIAGNOSIS — F17210 Nicotine dependence, cigarettes, uncomplicated: Secondary | ICD-10-CM | POA: Insufficient documentation

## 2020-08-18 DIAGNOSIS — R509 Fever, unspecified: Secondary | ICD-10-CM | POA: Insufficient documentation

## 2020-08-18 DIAGNOSIS — Z20822 Contact with and (suspected) exposure to covid-19: Secondary | ICD-10-CM | POA: Insufficient documentation

## 2020-08-18 DIAGNOSIS — J069 Acute upper respiratory infection, unspecified: Secondary | ICD-10-CM | POA: Insufficient documentation

## 2020-08-18 DIAGNOSIS — Z8249 Family history of ischemic heart disease and other diseases of the circulatory system: Secondary | ICD-10-CM | POA: Insufficient documentation

## 2020-08-18 LAB — RAPID INFLUENZA A&B ANTIGENS
Influenza A (ARMC): NEGATIVE
Influenza B (ARMC): NEGATIVE

## 2020-08-18 LAB — SARS CORONAVIRUS 2 (TAT 6-24 HRS): SARS Coronavirus 2: NEGATIVE

## 2020-08-18 MED ORDER — BENZONATATE 100 MG PO CAPS: 200.0000 mg | ORAL_CAPSULE | Freq: Three times a day (TID) | ORAL | 0 refills | Status: DC

## 2020-08-18 MED ORDER — PROMETHAZINE-DM 6.25-15 MG/5ML PO SYRP: 5.0000 mL | ORAL_SOLUTION | Freq: Four times a day (QID) | ORAL | 0 refills | Status: DC | PRN

## 2020-08-18 NOTE — ED Provider Notes (Signed)
[facility]    CSN: [id number] Arrival date & time: 08/18/20  2956      History   Chief Complaint Chief Complaint  Patient presents with  . Fever  . Generalized Body Aches  . Fatigue    HPI Bruce Graham is a 50 y.o. male.   HPI   50 year old male here for evaluation of left ear pain, body aches, and fever.  Patient reports that his symptoms started 2 days ago and began with chills.  Patient reports that Saturday night he awoke feeling cold with chills and his teeth chattering.  At that time his fever was 101.9.  Patient has had a mild cough but states he is also a smoker and this has not increased over baseline.  Patient states that he feels like he has the flu because this is the same way he felt last time he had the flu.  Patient denies any changes to hearing, dizziness, runny nose, sore throat, or GI complaints.  Patient has not had his COVID vaccine or his flu vaccine.  Past Medical History:  Diagnosis Date  . Abscess of right thigh 06/16/2015  . Barrett esophagus   . Bronchitis   . Complication of anesthesia    hard to wake up after hernia surgery  . Ear infection 12/13/2017   taking amoxicillin  . Family history of adverse reaction to anesthesia    mom hard to wake up   . GERD (gastroesophageal reflux disease)   . Hydradenitis    Left leg  . 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 Umbilical hernia without obstruction and without gangrene 11/22/2017    Patient Active Problem List   Diagnosis Date Noted  . Left sided abdominal pain of unknown cause 03/25/2020  . Family history of colon cancer   . GERD (gastroesophageal reflux disease) 06/16/2015    Past Surgical History:  Procedure Laterality Date  . COLONOSCOPY WITH PROPOFOL N/A 03/06/2018   Procedure: COLONOSCOPY WITH PROPOFOL;  Surgeon: Lin Landsman, MD;  Location: [facility] ENDOSCOPY;  Service: Gastroenterology;  Laterality: N/A;  . ESOPHAGOGASTRODUODENOSCOPY (EGD) WITH PROPOFOL N/A 03/06/2018    Procedure: ESOPHAGOGASTRODUODENOSCOPY (EGD) WITH PROPOFOL;  Surgeon: Lin Landsman, MD;  Location: [facility] ENDOSCOPY;  Service: Gastroenterology;  Laterality: N/A;  . HERNIA REPAIR    . INCISION AND DRAINAGE ABSCESS N/A 05/02/2020   Procedure: INCISION AND DRAINAGE ABSCESS;  Surgeon: Robert Bellow, MD;  Location: [facility] ORS;  Service: General;  Laterality: N/A;  . INCISION AND DRAINAGE PERIRECTAL ABSCESS Right 06/16/2015   Procedure: IRRIGATION AND DEBRIDEMENT right inner thigh ABSCESS;  Surgeon: Florene Glen, MD;  Location: [facility] ORS;  Service: General;  Laterality: Right;  . INSERTION OF MESH N/A 12/20/2017   Procedure: INSERTION OF MESH;  Surgeon: [name], MD;  Location: [facility] ORS;  Service: General;  Laterality: N/A;  . KNEE SURGERY Left   . PILONIDAL CYST EXCISION N/A 05/02/2020   Procedure: CYST EXCISION PILONIDAL EXTENSIVE;  Surgeon: Robert Bellow, MD;  Location: [facility] ORS;  Service: General;  Laterality: N/A;  . UMBILICAL HERNIA REPAIR N/A 12/20/2017   Procedure: HERNIA REPAIR UMBILICAL ADULT;  Surgeon: [name], MD;  Location: [facility] ORS;  Service: General;  Laterality: N/A;       Home Medications    Prior to Admission medications   Medication Sig Start Date End Date Taking? Authorizing Provider  benzonatate (TESSALON) 100 MG capsule Take 2 capsules (200 mg total) by mouth every 8 (eight) hours. 08/18/20  Yes Ninnie Fein,  Ysidro Evert, NP  promethazine-dextromethorphan (PROMETHAZINE-DM) 6.25-15 MG/5ML syrup Take 5 mLs by mouth 4 (four) times daily as needed. 08/18/20  Yes Margarette Canada, NP  calcium carbonate (TUMS - DOSED IN MG ELEMENTAL CALCIUM) 500 MG chewable tablet Chew 1 tablet by mouth as needed for indigestion or heartburn.     [provider]  mupirocin ointment (BACTROBAN) 2 % Apply 1 application topically daily. Qd to excision site with dressing changes 05/27/20   Ralene Bathe, MD    Family History Family History  Problem Relation Age of Onset   . Heart disease Father 43  . Cancer Father        Lung Cancer    Social History Social History   Tobacco Use  . Smoking status: Current Every Day Smoker    Packs/day: 0.50    Years: 30.00    Pack years: 15.00    Types: Cigarettes  . Smokeless tobacco: Never Used  Vaping Use  . Vaping Use: Never used  Substance Use Topics  . Alcohol use: Yes    Comment: social  . Drug use: No     Allergies   Patient has no known allergies.   Review of Systems Review of Systems  Constitutional: Positive for chills and fever.  HENT: Positive for ear pain. Negative for rhinorrhea and sore throat.   Respiratory: Positive for cough.   Gastrointestinal: Negative for diarrhea, nausea and vomiting.  Musculoskeletal: Positive for arthralgias and myalgias.  Skin: Negative for rash.  Hematological: Negative.   Psychiatric/Behavioral: Negative.      Physical Exam Triage Vital Signs ED Triage Vitals  Enc Vitals Group     BP 08/18/20 1011 128/90     Pulse Rate 08/18/20 1011 81     Resp 08/18/20 1011 18     Temp 08/18/20 1011 98.4 F (36.9 C)     Temp Source 08/18/20 1011 Oral     SpO2 08/18/20 1011 98 %     Weight --      Height --      Head Circumference --      Peak Flow --      Pain Score 08/18/20 1008 5     Pain Loc --      Pain Edu? --      Excl. in [place]? --    No data found.  Updated Vital Signs BP 128/90   Pulse 81   Temp 98.4 F (36.9 C) (Oral)   Resp 18   SpO2 98%   Visual Acuity Right Eye Distance:   Left Eye Distance:   Bilateral Distance:    Right Eye Near:   Left Eye Near:    Bilateral Near:     Physical Exam Vitals and nursing note reviewed.  Constitutional:      General: He is not in acute distress.    Appearance: Normal appearance. He is normal weight. He is not ill-appearing.  HENT:     Head: Normocephalic and atraumatic.     Right Ear: Tympanic membrane, ear canal and external ear normal.     Left Ear: Tympanic membrane, ear canal and external  ear normal.     Nose: Nose normal. No congestion or rhinorrhea.     Mouth/Throat:     Mouth: Mucous membranes are moist.     Pharynx: Oropharynx is clear. No oropharyngeal exudate or posterior oropharyngeal erythema.  Cardiovascular:     Rate and Rhythm: Normal rate and regular rhythm.     Pulses: Normal pulses.  Heart sounds: Normal heart sounds. No murmur heard. No gallop.   Pulmonary:     Effort: Pulmonary effort is normal.     Breath sounds: Normal breath sounds. No wheezing, rhonchi or rales.  Musculoskeletal:     Cervical back: Normal range of motion and neck supple.  Skin:    General: Skin is warm and dry.     Capillary Refill: Capillary refill takes less than 2 seconds.     Findings: No erythema or rash.  Neurological:     General: No focal deficit present.     Mental Status: He is alert and oriented to person, place, and time.  Psychiatric:        Mood and Affect: Mood normal.        Behavior: Behavior normal.        Thought Content: Thought content normal.        Judgment: Judgment normal.      UC Treatments / Results  Labs (all labs ordered are listed, but only abnormal results are displayed) Labs Reviewed  RAPID INFLUENZA A&B ANTIGENS  SARS CORONAVIRUS 2 (TAT 6-24 HRS)    EKG   Radiology No results found.  Procedures Procedures (including critical care time)  Medications Ordered in UC Medications - No data to display  Initial Impression / Assessment and Plan / UC Course  I have reviewed the triage vital signs and the nursing notes.  Pertinent labs & imaging results that were available during my care of the patient were reviewed by me and considered in my medical decision making (see chart for details).   Is a very pleasant 50 year old male here for evaluation of cold-like symptoms that been going on for the past 2 days.  Patient had a fever with a T-max of one 1.9.  Patient is afebrile in clinic at present after taking 800 of ibuprofen.   Patient's physical exam is not impressive.  Bilateral TMs are pearly gray with normal light reflex and clear external auditory canals, no eustachian tube tenderness, no cervical lymphadenopathy.  Posterior oropharynx is pink and moist without erythema or edema and nasal mucosa is pink and moist without erythema or edema or discharge.  Lungs are clear to auscultation in all fields.  Patient is unvaccinated as COVID or flu.  Will check COVID and rapid flu here in clinic.  Rapid flu was negative.  Discharge patient home with a diagnosis of URI and have him isolate pending the results of his Covid test.  We will treat patient with Tessalon Perles and Promethazine DM cough syrup.   Final Clinical Impressions(s) / UC Diagnoses   Final diagnoses:  Upper respiratory tract infection, unspecified type     Discharge Instructions     Isolate at home pending the results of your COVID test.  If you test positive then you will have to quarantine for 5 days from the start of your symptoms.  After 5 days you can break quarantine if your symptoms have improved and you have not had a fever for 24 hours without taking Tylenol or ibuprofen.  Use over-the-counter Tylenol and ibuprofen as needed for body aches and fever.  Use the Tessalon Perles during the day as needed for cough and the Promethazine DM cough syrup at nighttime as will make you drowsy.  If you develop any increased shortness of breath-especially at rest, you are unable to speak in full sentences, or is a late sign your lips are turning blue you need to go the ER for  evaluation.     ED Prescriptions    Medication Sig Dispense Auth. Provider   benzonatate (TESSALON) 100 MG capsule Take 2 capsules (200 mg total) by mouth every 8 (eight) hours. 21 capsule Margarette Canada, NP   promethazine-dextromethorphan (PROMETHAZINE-DM) 6.25-15 MG/5ML syrup Take 5 mLs by mouth 4 (four) times daily as needed. 118 mL Margarette Canada, NP     PDMP not reviewed this  encounter.   Margarette Canada, NP 08/18/20 1120

## 2020-08-18 NOTE — ED Triage Notes (Signed)
Pt is present today with ear pain, body aches, and fever 100.8 yesterday,. Pt states that he took ibuprofen 800 mg  two hours ago. Pt states that sx started Saturday night.

## 2020-08-18 NOTE — Discharge Instructions (Addendum)
Isolate at home pending the results of your COVID test.  If you test positive then you will have to quarantine for 5 days from the start of your symptoms.  After 5 days you can break quarantine if your symptoms have improved and you have not had a fever for 24 hours without taking Tylenol or ibuprofen.  Use over-the-counter Tylenol and ibuprofen as needed for body aches and fever.  Use the Tessalon Perles during the day as needed for cough and the Promethazine DM cough syrup at nighttime as will make you drowsy.  If you develop any increased shortness of breath-especially at rest, you are unable to speak in full sentences, or is a late sign your lips are turning blue you need to go the ER for evaluation.

## 2020-11-13 ENCOUNTER — Other Ambulatory Visit: Payer: Self-pay | Admitting: General Surgery

## 2020-11-13 DIAGNOSIS — Z8249 Family history of ischemic heart disease and other diseases of the circulatory system: Secondary | ICD-10-CM

## 2020-11-14 LAB — SURGICAL PATHOLOGY

## 2020-12-23 ENCOUNTER — Ambulatory Visit: Payer: Managed Care, Other (non HMO) | Admitting: Cardiology

## 2021-02-06 ENCOUNTER — Encounter: Payer: Self-pay | Admitting: Internal Medicine

## 2021-02-06 ENCOUNTER — Ambulatory Visit: Payer: Managed Care, Other (non HMO) | Admitting: Internal Medicine

## 2021-02-06 ENCOUNTER — Other Ambulatory Visit: Payer: Self-pay

## 2021-02-06 VITALS — BP 168/100 | HR 55 | Temp 96.5°F | Ht 70.0 in | Wt 234.8 lb

## 2021-02-06 DIAGNOSIS — R030 Elevated blood-pressure reading, without diagnosis of hypertension: Secondary | ICD-10-CM | POA: Diagnosis not present

## 2021-02-06 DIAGNOSIS — E669 Obesity, unspecified: Secondary | ICD-10-CM

## 2021-02-06 DIAGNOSIS — R5383 Other fatigue: Secondary | ICD-10-CM

## 2021-02-06 DIAGNOSIS — R311 Benign essential microscopic hematuria: Secondary | ICD-10-CM | POA: Diagnosis not present

## 2021-02-06 DIAGNOSIS — L732 Hidradenitis suppurativa: Secondary | ICD-10-CM

## 2021-02-06 NOTE — Progress Notes (Signed)
Subjective:  Patient ID: Bruce Graham, male    DOB: February 02, 1971  Age: 50 y.o. MRN: [id number]  CC: The primary encounter diagnosis was Obesity (BMI 30.0-34.9). Diagnoses of Elevated blood pressure reading in office without diagnosis of hypertension, Fatigue, unspecified type, Benign essential microscopic hematuria, Hidradenitis suppurativa, and Elevated blood pressure reading without diagnosis of hypertension were also pertinent to this visit.  HPI Bruce Graham presents for establishment of care.  Referred by Job Founds for elevated blood pressure   This visit occurred during the SARS-CoV-2 public health emergency.  Safety protocols were in place, including screening questions prior to the visit, additional usage of staff PPE, and extensive cleaning of exam room while observing appropriate contact time as indicated for disinfecting solutions.   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  Last one drained in May 2022 by Dr Bary Castilla.    He has had several readings Elevated blood pressure:  no prior home checks.  Does not take NSAIDS .  Diet reviewed,  fast food daily .    Lab Results  Component Value Date   HGBA1C 5.3 2020/05/21   Father died in 07/11/17 .  Lung and bladder cancer. He was a Smoker.  Currently down to 4 cigs daily from 2 -2.5  packs daily  overr several weeks using nicotine  pouches to get down to < 1 pack daily .  Chronic bronchitis cough has resolved.  Alcohol rare.   Soc Hx:  he is raising his 58 yr old grandson because his daughter had the child at 79 and decided to abandon  him.     History Bruce Graham has a past medical history of Abscess of right thigh (06/16/2015), Barrett esophagus, Bronchitis, Complication of anesthesia, Ear infection (12/13/2017), Family history of adverse reaction to anesthesia, GERD (gastroesophageal reflux disease), Hydradenitis,  Suppurative hidradenitis (02/28/9243), and Umbilical hernia without obstruction and without gangrene (11/22/2017).   He has a past surgical history that includes Knee surgery (Left); Incision and drainage perirectal abscess (Right, 06/16/2015); Umbilical hernia repair (N/A, 12/20/2017); Insertion of mesh (N/A, 12/20/2017); Hernia repair; Esophagogastroduodenoscopy (egd) with propofol (N/A, 03/06/2018); Colonoscopy with propofol (N/A, 03/06/2018); Incision and drainage abscess (N/A, 05/02/2020); Pilonidal cyst excision (N/A, 05/02/2020); and Knee arthroscopy with anterior cruciate ligament (acl) repair (Left, 2002).   His family history includes Cancer (age of onset: 57) in his paternal grandfather; Cancer (age of onset: 44) in his father; Heart disease (age of onset: 26) in his father.He reports that he has been smoking cigarettes. He has a 15.00 pack-year smoking history. He has never used smokeless tobacco. He reports current alcohol use. He reports that he does not use drugs.  Outpatient Medications Prior to Visit  Medication Sig Dispense Refill   benzonatate (TESSALON) 100 MG capsule Take 2 capsules (200 mg total) by mouth every 8 (eight) hours. 21 capsule 0   calcium carbonate (TUMS - DOSED IN MG ELEMENTAL CALCIUM) 500 MG chewable tablet Chew 1 tablet by mouth as needed for indigestion or heartburn.      mupirocin ointment (BACTROBAN) 2 % Apply 1 application topically daily. Qd to excision site with dressing changes 22 g 0   promethazine-dextromethorphan (PROMETHAZINE-DM) 6.25-15 MG/5ML syrup Take 5 mLs by mouth 4 (four) times daily as needed. 118 mL 0   No facility-administered medications prior to visit.    Review of Systems:  Patient denies headache, fevers, malaise, unintentional weight loss,  skin rash, eye pain, sinus congestion and sinus pain, sore throat, dysphagia,  hemoptysis , cough, dyspnea, wheezing, chest pain, palpitations, orthopnea, edema, abdominal pain, nausea, melena, diarrhea,  constipation, flank pain, dysuria, hematuria, urinary  Frequency, nocturia, numbness, tingling, seizures,  Focal weakness, Loss of consciousness,  Tremor, insomnia, depression, anxiety, and suicidal ideation.     Objective:  BP (!) 168/100 (BP Location: Right Arm, Patient Position: Sitting, Cuff Size: Large)   Pulse (!) 55   Temp (!) 96.5 F (35.8 C) (Temporal)   Ht 5' 10"  (1.778 m)   Wt 234 lb 12.8 oz (106.5 kg)   SpO2 98%   BMI 33.69 kg/m   Physical Exam:  General appearance: alert, cooperative and appears stated age Ears: normal TM's and external ear canals both ears Throat: lips, mucosa, and tongue normal; teeth and gums normal Neck: no adenopathy, no carotid bruit, supple, symmetrical, trachea midline and thyroid not enlarged, symmetric, no tenderness/mass/nodules Back: symmetric, no curvature. ROM normal. No CVA tenderness. Lungs: clear to auscultation bilaterally Heart: regular rate and rhythm, S1, S2 normal, no murmur, click, rub or gallop Abdomen: soft, non-tender; bowel sounds normal; no masses,  no organomegaly Pulses: 2+ and symmetric Skin: multiple axillary scars . No rashes or lesions Lymph nodes: Cervical, supraclavicular, and axillary nodes normal.   Assessment & Plan:   Problem List Items Addressed This Visit       Unprioritized   Hidradenitis suppurativa    Resulting in recurrent abscesses requiring incision and drainage . Recommending use of dial soap daily and once weekly Hibiclens to reduce bacterial load       Elevated blood pressure reading without diagnosis of hypertension    He has no prior history of hypertension. He will check his blood pressure several times over the next 3-4 weeks and to submit readings for evaluation. Urine microalbumin to creatinine ratio done today is normal.  Lab Results  Component Value Date   LABMICR 5.4 02/06/2021   LABMICR Comment 02/06/2021           Other Visit Diagnoses     Obesity (BMI 30.0-34.9)    -   Primary   Relevant Orders   Lipid panel   TSH   Elevated blood pressure reading in office without diagnosis of hypertension       Relevant Orders   Comprehensive metabolic panel   Microalbumin / creatinine urine ratio (Completed)   Fatigue, unspecified type       Relevant Orders   CBC with Differential/Platelet   Benign essential microscopic hematuria       Relevant Orders   Urinalysis, Routine w reflex microscopic (Completed)       I have discontinued Bruce Graham's calcium carbonate, mupirocin ointment, benzonatate, and promethazine-dextromethorphan.  No orders of the defined types were placed in this encounter.   Medications Discontinued During This Encounter  Medication Reason   benzonatate (TESSALON) 100 MG capsule Error   calcium carbonate (TUMS - DOSED IN MG ELEMENTAL CALCIUM) 500 MG chewable tablet Error   mupirocin ointment (BACTROBAN) 2 % Error   promethazine-dextromethorphan (PROMETHAZINE-DM) 6.25-15 MG/5ML syrup Error    Follow-up: Return in about 3 months (around 05/09/2021).   Bruce Mc, MD

## 2021-02-06 NOTE — Patient Instructions (Addendum)
Your blood pressure  is HIGH (normal is < 130/80)   Please Check BP once daily with mom's machine and send me readings after  5 days so I can decide if you need to start medication  Labs next week fasting.    Urine test today for protein and blood   To reduce recurrence of skin abscesses:  Shower daily with dial soap and once weekly  Hibiclens

## 2021-02-07 LAB — URINALYSIS, ROUTINE W REFLEX MICROSCOPIC
Bilirubin, UA: NEGATIVE
Glucose, UA: NEGATIVE
Ketones, UA: NEGATIVE
Leukocytes,UA: NEGATIVE
Nitrite, UA: NEGATIVE
Protein,UA: NEGATIVE
RBC, UA: NEGATIVE
Specific Gravity, UA: 1.011 (ref 1.005–1.030)
Urobilinogen, Ur: 0.2 mg/dL (ref 0.2–1.0)
pH, UA: 7 (ref 5.0–7.5)

## 2021-02-07 LAB — MICROALBUMIN / CREATININE URINE RATIO
Creatinine, Urine: 57.4 mg/dL
Microalb/Creat Ratio: 9 mg/g creat (ref 0–29)
Microalbumin, Urine: 5.4 ug/mL

## 2021-02-08 ENCOUNTER — Encounter: Payer: Self-pay | Admitting: Internal Medicine

## 2021-02-08 DIAGNOSIS — R030 Elevated blood-pressure reading, without diagnosis of hypertension: Secondary | ICD-10-CM | POA: Insufficient documentation

## 2021-02-08 DIAGNOSIS — I10 Essential (primary) hypertension: Secondary | ICD-10-CM | POA: Insufficient documentation

## 2021-02-08 NOTE — Assessment & Plan Note (Signed)
Resulting in recurrent abscesses requiring incision and drainage . Recommending use of dial soap daily and once weekly Hibiclens to reduce bacterial load

## 2021-02-08 NOTE — Assessment & Plan Note (Signed)
He has no prior history of hypertension. He will check his blood pressure several times over the next 3-4 weeks and to submit readings for evaluation. Urine microalbumin to creatinine ratio done today is normal.  Lab Results  Component Value Date   LABMICR 5.4 02/06/2021   LABMICR Comment 02/06/2021

## 2021-02-09 ENCOUNTER — Telehealth: Payer: Self-pay

## 2021-02-09 NOTE — Telephone Encounter (Signed)
LMTCB in regards to lab results.  

## 2021-02-12 ENCOUNTER — Other Ambulatory Visit (INDEPENDENT_AMBULATORY_CARE_PROVIDER_SITE_OTHER): Payer: Managed Care, Other (non HMO)

## 2021-02-12 ENCOUNTER — Other Ambulatory Visit: Payer: Self-pay

## 2021-02-12 DIAGNOSIS — R030 Elevated blood-pressure reading, without diagnosis of hypertension: Secondary | ICD-10-CM

## 2021-02-12 DIAGNOSIS — R5383 Other fatigue: Secondary | ICD-10-CM | POA: Diagnosis not present

## 2021-02-12 DIAGNOSIS — E669 Obesity, unspecified: Secondary | ICD-10-CM | POA: Diagnosis not present

## 2021-02-12 LAB — COMPREHENSIVE METABOLIC PANEL
ALT: 13 U/L (ref 0–53)
AST: 17 U/L (ref 0–37)
Albumin: 4 g/dL (ref 3.5–5.2)
Alkaline Phosphatase: 77 U/L (ref 39–117)
BUN: 8 mg/dL (ref 6–23)
CO2: 27 mEq/L (ref 19–32)
Calcium: 9.1 mg/dL (ref 8.4–10.5)
Chloride: 103 mEq/L (ref 96–112)
Creatinine, Ser: 1.1 mg/dL (ref 0.40–1.50)
GFR: 78.1 mL/min (ref >60.00)
Glucose, Bld: 75 mg/dL (ref 70–99)
Potassium: 4.3 mEq/L (ref 3.5–5.1)
Sodium: 137 mEq/L (ref 135–145)
Total Bilirubin: 0.6 mg/dL (ref 0.2–1.2)
Total Protein: 7.7 g/dL (ref 6.0–8.3)

## 2021-02-12 LAB — CBC WITH DIFFERENTIAL/PLATELET
Basophils Absolute: 0.1 10*3/uL (ref 0.0–0.1)
Basophils Relative: 0.5 % (ref 0.0–3.0)
Eosinophils Absolute: 0.2 10*3/uL (ref 0.0–0.7)
Eosinophils Relative: 1.7 % (ref 0.0–5.0)
HCT: 43.6 % (ref 39.0–52.0)
Hemoglobin: 14.6 g/dL (ref 13.0–17.0)
Lymphocytes Relative: 26.3 % (ref 12.0–46.0)
Lymphs Abs: 3.1 10*3/uL (ref 0.7–4.0)
MCHC: 33.5 g/dL (ref 30.0–36.0)
MCV: 84.7 fl (ref 78.0–100.0)
Monocytes Absolute: 1.1 10*3/uL — ABNORMAL HIGH (ref 0.1–1.0)
Monocytes Relative: 9 % (ref 3.0–12.0)
Neutro Abs: 7.4 10*3/uL (ref 1.4–7.7)
Neutrophils Relative %: 62.5 % (ref 43.0–77.0)
Platelets: 375 10*3/uL (ref 150.0–400.0)
RBC: 5.14 Mil/uL (ref 4.22–5.81)
RDW: 14.7 % (ref 11.5–15.5)
WBC: 11.9 10*3/uL — ABNORMAL HIGH (ref 4.0–10.5)

## 2021-02-12 LAB — LIPID PANEL
Cholesterol: 174 mg/dL (ref 0–200)
HDL: 40.8 mg/dL (ref >39.00)
LDL Cholesterol: 98 mg/dL (ref 0–99)
NonHDL: 132.86
Total CHOL/HDL Ratio: 4
Triglycerides: 175 mg/dL — ABNORMAL HIGH (ref 0.0–149.0)
VLDL: 35 mg/dL (ref 0.0–40.0)

## 2021-02-12 LAB — TSH: TSH: 0.67 u[IU]/mL (ref 0.35–5.50)

## 2021-02-13 ENCOUNTER — Other Ambulatory Visit: Payer: Self-pay | Admitting: Internal Medicine

## 2021-02-13 DIAGNOSIS — D72829 Elevated white blood cell count, unspecified: Secondary | ICD-10-CM

## 2021-02-13 NOTE — Progress Notes (Signed)
Your thyroid function,  electrolytes,  liver and kidney function are all normal .  and your cholesterol is at goal for someone without diabetes and hypertension.  (Goals are lower as risk factors  for heart disease are acquired.  Please get BP checked as requested.   Your white blood cell  count was slightly  elevated on your recent labs, and looking back,  it has been elevated for the last 3 years. This may be due to recurrent skin infections,  but may be due to a bone marrow condition .  If this has been worked up by your previous physician, let me know.  If not,  I would recommend seeing a hematologist for some additional bloodwork.    Regards,   Deborra Medina, MD      Please continue your current medications and  plan to repeat  fasting labs in 6 months.    Regards,   Deborra Medina, MD

## 2021-02-15 ENCOUNTER — Telehealth: Payer: Self-pay | Admitting: Internal Medicine

## 2021-02-15 DIAGNOSIS — D72829 Elevated white blood cell count, unspecified: Secondary | ICD-10-CM

## 2021-02-15 DIAGNOSIS — I10 Essential (primary) hypertension: Secondary | ICD-10-CM

## 2021-02-15 NOTE — Telephone Encounter (Signed)
Bruce Graham  Please cancel the hematology referral .  Previous episodes ef elevated white counts have been researched and all were due to infections at the time.    [name] please let patient know.  The cbc needs to be  repeated in one month, providing he has no active infection at the time

## 2021-02-16 NOTE — Telephone Encounter (Signed)
Spoke with pt in regards to the referral being canceled and why. Pt gave a verbal understanding. Pt would like to have labs emailed to him. P tis aware that I will email them when get back in to our office.

## 2021-02-17 ENCOUNTER — Ambulatory Visit: Payer: Managed Care, Other (non HMO) | Admitting: Cardiovascular Disease

## 2021-02-17 ENCOUNTER — Encounter: Payer: Self-pay | Admitting: Cardiovascular Disease

## 2021-02-17 ENCOUNTER — Other Ambulatory Visit: Payer: Self-pay

## 2021-02-17 DIAGNOSIS — Z8249 Family history of ischemic heart disease and other diseases of the circulatory system: Secondary | ICD-10-CM | POA: Diagnosis not present

## 2021-02-17 DIAGNOSIS — Z720 Tobacco use: Secondary | ICD-10-CM | POA: Diagnosis not present

## 2021-02-17 DIAGNOSIS — R030 Elevated blood-pressure reading, without diagnosis of hypertension: Secondary | ICD-10-CM | POA: Diagnosis not present

## 2021-02-17 NOTE — Assessment & Plan Note (Signed)
Blood pressure today was 124/86.  He is not on any antihypertensive medications.  He is aware of salt restriction.  He does check his blood pressure at home and it is borderline.  At this point, we will continue to follow.

## 2021-02-17 NOTE — Progress Notes (Signed)
02/17/2021 Bruce Graham   09/10/1970  [id number]  Primary Physician Crecencio Mc, MD Primary Cardiologist: Lorretta Harp MD Garret Reddish, [place], [place]  HPI:  Bruce Graham is a 50 y.o. moderately overweight single Caucasian male father of 2 children referred by Dr. Derrel Nip to be established because of risk factors.  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  He has a greater than 100-pack-year history of tobacco abuse having smoked 2-1/2 packs a day for the last 40 years now down to one third of a pack a day.  He does have a family history of heart disease with a father who had CAD and was my patient as well who died 2017/06/29.  He is never had a heart attack or stroke.  He gets occasional shortness of breath but denies chest pain.  His most recent lipid profile performed 02/12/2021 revealed total cholesterol 174, LDL of 98 and HDL 40.   No outpatient medications have been marked as taking for the 02/17/21 encounter (Office Visit) with Lorretta Harp, MD.     No Known Allergies  Social History   Socioeconomic History   Marital status: Single    Spouse name: Not on file   Number of children: Not on file   Years of education: Not on file   Highest education level: Not on file  Occupational History   Not on file  Tobacco Use   Smoking status: Every Day    Packs/day: 0.50    Years: 30.00    Pack years: 15.00    Types: Cigarettes   Smokeless tobacco: Never  Vaping Use   Vaping Use: Never used  Substance and Sexual Activity   Alcohol use: Yes    Comment: social   Drug use: No   Sexual activity: Not on file  Other Topics Concern   Not on file  Social History Narrative   Not on file   Social Determinants of Health   Financial Resource Strain: Not on file  Food Insecurity: Not on file  Transportation Needs: Not on file  Physical Activity: Not on file  Stress: Not on file  Social Connections: Not on file   Intimate Partner Violence: Not on file     Review of Systems: General: negative for chills, fever, night sweats or weight changes.  Cardiovascular: negative for chest pain, dyspnea on exertion, edema, orthopnea, palpitations, paroxysmal nocturnal dyspnea or shortness of breath Dermatological: negative for rash Respiratory: negative for cough or wheezing Urologic: negative for hematuria Abdominal: negative for nausea, vomiting, diarrhea, bright red blood per rectum, melena, or hematemesis Neurologic: negative for visual changes, syncope, or dizziness All other systems reviewed and are otherwise negative except as noted above.    Blood pressure 124/86, pulse 76, height 5' 10"  (1.778 m), weight 229 lb (103.9 kg).  General appearance: alert and no distress Neck: no adenopathy, no carotid bruit, no JVD, supple, symmetrical, trachea midline, and thyroid not enlarged, symmetric, no tenderness/mass/nodules Lungs: clear to auscultation bilaterally Heart: regular rate and rhythm, S1, S2 normal, no murmur, click, rub or gallop Extremities: extremities normal, atraumatic, no cyanosis or edema Pulses: 2+ and symmetric Skin: Skin color, texture, turgor normal. No rashes or lesions Neurologic: Grossly normal  EKG sinus rhythm at 78 without ST or T wave changes.  I personally reviewed this EKG.  ASSESSMENT AND PLAN:   Tobacco abuse Greater than 100-pack-year history tobacco abuse having smoked 2-1/2  packs a day for close to 40years smoking a third of a pack a day with the intent to quit  Elevated blood pressure reading without diagnosis of hypertension Blood pressure today was 124/86.  He is not on any antihypertensive medications.  He is aware of salt restriction.  He does check his blood pressure at home and it is borderline.  At this point, we will continue to follow.  Family history of heart disease His father also Bruce Graham died 2017-06-17.  He did have CAD and was my  patient.     Lorretta Harp MD FACP,FACC,FAHA, [facility] 02/17/2021 10:36 AM

## 2021-02-17 NOTE — Patient Instructions (Signed)
Medication Instructions:  The current medical regimen is effective;  continue present plan and medications.  *If you need a refill on your cardiac medications before your next appointment, please call your pharmacy*  Testing/Procedures: CALCIUM SCORE   Echocardiogram - Your physician has requested that you have an echocardiogram. Echocardiography is a painless test that uses sound waves to create images of your heart. It provides your doctor with information about the size and shape of your heart and how well your heart's chambers and valves are working. This procedure takes approximately one hour. There are no restrictions for this procedure. This will be performed at our [facility] location - [address], [address].   Follow-Up: At [facility], you and your health needs are our priority.  As part of our continuing mission to provide you with exceptional heart care, we have created designated Provider Care Teams.  These Care Teams include your primary Cardiologist (physician) and Advanced Practice Providers (APPs -  Physician Assistants and Nurse Practitioners) who all work together to provide you with the care you need, when you need it.  We recommend signing up for the patient portal called "MyChart".  Sign up information is provided on this After Visit Summary.  MyChart is used to connect with patients for Virtual Visits (Telemedicine).  Patients are able to view lab/test results, encounter notes, upcoming appointments, etc.  Non-urgent messages can be sent to your provider as well.   To learn more about what you can do with MyChart, go to [email or web address].    Your next appointment:   12 month(s)  The format for your next appointment:   In Person  Provider:   Quay Burow, MD

## 2021-02-17 NOTE — Assessment & Plan Note (Signed)
Greater than 100-pack-year history tobacco abuse having smoked 2-1/2 packs a day for close to 40years smoking a third of a pack a day with the intent to quit

## 2021-02-17 NOTE — Assessment & Plan Note (Signed)
His father also Hezzie Karim died 06/13/17.  He did have CAD and was my patient.

## 2021-02-17 NOTE — Telephone Encounter (Signed)
Labs have been emailed to him per his request

## 2021-02-18 MED ORDER — AMLODIPINE BESYLATE 2.5 MG PO TABS: 2.5000 mg | ORAL_TABLET | Freq: Every day | ORAL | 0 refills | Status: DC

## 2021-02-18 NOTE — Telephone Encounter (Signed)
His home BP readings have been elevated to 138/96 ,  lowest was 138/88.  Will start amlodipine 2.5 mg daily   Recheck  home reading after one week and let me know results

## 2021-02-18 NOTE — Telephone Encounter (Signed)
Patient has been notified

## 2021-02-18 NOTE — Assessment & Plan Note (Signed)
His home BP readings have been elevated to 138/96 ,  lowest was 138/88.  Will start amlodipine 2.5 mg daily

## 2021-02-18 NOTE — Telephone Encounter (Signed)
Unable to leave message for patient to return call back. VM box was full.

## 2021-02-26 NOTE — Addendum Note (Signed)
Addended by: Jacqulynn Cadet on: 02/26/2021 02:31 PM   Modules accepted: Orders

## 2021-03-17 ENCOUNTER — Other Ambulatory Visit: Payer: Managed Care, Other (non HMO)

## 2021-03-17 ENCOUNTER — Ambulatory Visit (HOSPITAL_COMMUNITY): Payer: Managed Care, Other (non HMO) | Attending: Cardiology

## 2021-03-17 ENCOUNTER — Ambulatory Visit (INDEPENDENT_AMBULATORY_CARE_PROVIDER_SITE_OTHER)
Admission: RE | Admit: 2021-03-17 | Discharge: 2021-03-17 | Disposition: A | Discharge status: Home / Self Care | Payer: Self-pay | Source: Ambulatory Visit | Attending: Cardiovascular Disease | Admitting: Cardiovascular Disease

## 2021-03-17 ENCOUNTER — Other Ambulatory Visit: Payer: Self-pay

## 2021-03-17 DIAGNOSIS — R0602 Shortness of breath: Secondary | ICD-10-CM | POA: Insufficient documentation

## 2021-03-17 DIAGNOSIS — Z8249 Family history of ischemic heart disease and other diseases of the circulatory system: Secondary | ICD-10-CM

## 2021-03-17 LAB — ECHOCARDIOGRAM COMPLETE
Area-P 1/2: 2.17 cm2
S' Lateral: 4.8 cm

## 2021-04-09 ENCOUNTER — Telehealth: Payer: Self-pay | Admitting: Cardiovascular Disease

## 2021-04-09 DIAGNOSIS — Z720 Tobacco use: Secondary | ICD-10-CM

## 2021-04-09 DIAGNOSIS — E785 Hyperlipidemia, unspecified: Secondary | ICD-10-CM

## 2021-04-09 DIAGNOSIS — I2510 Atherosclerotic heart disease of native coronary artery without angina pectoris: Secondary | ICD-10-CM

## 2021-04-09 MED ORDER — ATORVASTATIN CALCIUM 40 MG PO TABS: 40.0000 mg | ORAL_TABLET | Freq: Every day | ORAL | 3 refills | Status: DC

## 2021-04-09 NOTE — Telephone Encounter (Signed)
Pt is reaching out for echo results... please advise

## 2021-04-09 NOTE — Telephone Encounter (Signed)
Patient called w/echo & calcium score results. Apologized for delay in communication -- echo results were note routed to a nurse from what I can tell   Atorvastatin sent to [facility] Lipid panel ordered/mailed  Should he start on ASA 29m?  He would like to know if any further testing is warranted, given high CAC score -- sent to MD/RN

## 2021-04-09 NOTE — Telephone Encounter (Signed)
Echo Results  Pixie Casino, MD  03/20/2021  5:51 PM EDT     Dilated LV, normal LVEF 55%, grade 1 DD   Dr Lemmie Evens     Calcium Score Results  Lorretta Harp, MD  04/01/2021  5:42 PM EDT     LDL 98 with elevated CCS. Start Atorva 40 mg / day and re check FLP 3 months   Beatrix Fetters, RN  04/01/2021  2:45 PM EDT     High CAC score of 731, 99th percentile for age. Would you like at start pt on statin?    Pixie Casino, MD  03/20/2021  5:40 PM EDT     High CAC score of 731, 99th percentile for age. Emyphsema and pulmonary nodule noted - follow-up CT recommended.   Dr Lemmie Evens

## 2021-04-09 NOTE — Telephone Encounter (Signed)
Lorretta Harp, MD  You 5 minutes ago (3:02 PM)   2D essentially Nl. With elevated CCS would get Exercise MV . He is a smoker as well. Agree with starting low dose ASA.   JJB    Patient called w/MD advice as noted He agreed w/plan Med list update Test ordered Staff message sent to scheduling team  MD to sign stress test order attestation

## 2021-04-17 ENCOUNTER — Telehealth (HOSPITAL_COMMUNITY): Payer: Self-pay | Admitting: *Deleted

## 2021-04-17 NOTE — Telephone Encounter (Signed)
Close encounter 

## 2021-04-21 ENCOUNTER — Other Ambulatory Visit: Payer: Self-pay

## 2021-04-21 ENCOUNTER — Ambulatory Visit (HOSPITAL_COMMUNITY)
Admission: RE | Admit: 2021-04-21 | Discharge: 2021-04-21 | Disposition: A | Discharge status: Home / Self Care | Payer: Managed Care, Other (non HMO) | Source: Ambulatory Visit | Attending: Cardiovascular Disease | Admitting: Cardiovascular Disease

## 2021-04-21 DIAGNOSIS — I2584 Coronary atherosclerosis due to calcified coronary lesion: Secondary | ICD-10-CM | POA: Diagnosis present

## 2021-04-21 DIAGNOSIS — I2510 Atherosclerotic heart disease of native coronary artery without angina pectoris: Secondary | ICD-10-CM | POA: Insufficient documentation

## 2021-04-21 DIAGNOSIS — E785 Hyperlipidemia, unspecified: Secondary | ICD-10-CM | POA: Diagnosis present

## 2021-04-21 DIAGNOSIS — Z720 Tobacco use: Secondary | ICD-10-CM | POA: Diagnosis not present

## 2021-04-21 LAB — MYOCARDIAL PERFUSION IMAGING
LV dias vol: 233 mL (ref 62–150)
LV sys vol: 171 mL
Peak HR: 103 {beats}/min
Rest HR: 74 {beats}/min
Rest Nuclear Isotope Dose: 9.6 mCi
SDS: 2
SRS: 7
SSS: 9
ST Depression (mm): 0 mm
Stress Nuclear Isotope Dose: 32.7 mCi
TID: 0.99

## 2021-04-21 MED ORDER — REGADENOSON 0.4 MG/5ML IV SOLN
0.4000 mg | Freq: Once | INTRAVENOUS | Status: AC
  Administered 2021-04-21: 0.4 mg via INTRAVENOUS

## 2021-04-21 MED ORDER — TECHNETIUM TC 99M TETROFOSMIN IV KIT
32.7000 | PACK | Freq: Once | INTRAVENOUS | Status: AC | PRN
  Administered 2021-04-21: 32.7 via INTRAVENOUS
  Filled 2021-04-21: qty 33

## 2021-04-21 MED ORDER — TECHNETIUM TC 99M TETROFOSMIN IV KIT
9.6000 | PACK | Freq: Once | INTRAVENOUS | Status: AC | PRN
  Administered 2021-04-21: 9.6 via INTRAVENOUS
  Filled 2021-04-21: qty 10

## 2021-04-21 MED ORDER — AMINOPHYLLINE 25 MG/ML IV SOLN
75.0000 mg | Freq: Once | INTRAVENOUS | Status: AC
  Administered 2021-04-21: 75 mg via INTRAVENOUS

## 2021-05-13 ENCOUNTER — Ambulatory Visit: Payer: Managed Care, Other (non HMO) | Admitting: Internal Medicine

## 2021-05-24 ENCOUNTER — Ambulatory Visit
Admission: EM | Admit: 2021-05-24 | Discharge: 2021-05-24 | Disposition: A | Discharge status: Home / Self Care | Payer: Managed Care, Other (non HMO) | Attending: Physician Assistant | Admitting: Physician Assistant

## 2021-05-24 ENCOUNTER — Encounter: Payer: Self-pay | Admitting: Emergency Medicine

## 2021-05-24 ENCOUNTER — Ambulatory Visit (INDEPENDENT_AMBULATORY_CARE_PROVIDER_SITE_OTHER): Payer: Managed Care, Other (non HMO)

## 2021-05-24 ENCOUNTER — Other Ambulatory Visit: Payer: Self-pay

## 2021-05-24 DIAGNOSIS — J441 Chronic obstructive pulmonary disease with (acute) exacerbation: Secondary | ICD-10-CM | POA: Insufficient documentation

## 2021-05-24 DIAGNOSIS — Z20822 Contact with and (suspected) exposure to covid-19: Secondary | ICD-10-CM | POA: Diagnosis not present

## 2021-05-24 DIAGNOSIS — R062 Wheezing: Secondary | ICD-10-CM

## 2021-05-24 DIAGNOSIS — R509 Fever, unspecified: Secondary | ICD-10-CM

## 2021-05-24 DIAGNOSIS — R0602 Shortness of breath: Secondary | ICD-10-CM | POA: Diagnosis not present

## 2021-05-24 DIAGNOSIS — R059 Cough, unspecified: Secondary | ICD-10-CM

## 2021-05-24 DIAGNOSIS — R051 Acute cough: Secondary | ICD-10-CM

## 2021-05-24 DIAGNOSIS — F17210 Nicotine dependence, cigarettes, uncomplicated: Secondary | ICD-10-CM | POA: Insufficient documentation

## 2021-05-24 MED ORDER — AZITHROMYCIN 250 MG PO TABS: 250.0000 mg | ORAL_TABLET | Freq: Every day | ORAL | 0 refills | Status: DC

## 2021-05-24 MED ORDER — PSEUDOEPH-BROMPHEN-DM 30-2-10 MG/5ML PO SYRP: 10.0000 mL | ORAL_SOLUTION | Freq: Four times a day (QID) | ORAL | 0 refills | Status: AC | PRN

## 2021-05-24 MED ORDER — ALBUTEROL SULFATE HFA 108 (90 BASE) MCG/ACT IN AERS: 1.0000 | INHALATION_SPRAY | Freq: Four times a day (QID) | RESPIRATORY_TRACT | 0 refills | Status: DC | PRN

## 2021-05-24 MED ORDER — PREDNISONE 20 MG PO TABS: 40.0000 mg | ORAL_TABLET | Freq: Every day | ORAL | 0 refills | Status: AC

## 2021-05-24 NOTE — ED Triage Notes (Signed)
Patient c/o nasal congestion, cough, chest congestion, bodyaches and fever that started Wed night.

## 2021-05-24 NOTE — ED Provider Notes (Signed)
[facility]    CSN: [id number] Arrival date & time: 05/24/21  4696      History   Chief Complaint Chief Complaint  Patient presents with   Cough   Generalized Body Aches   Fever    HPI Bruce Graham is a 50 y.o. male presenting for 4-day history of nasal congestion, cough that is unproductive, fever, fatigue, body aches.  Also reports feeling a little short of breath at times.  Patient has been taking over-the-counter cough medication and Tylenol.  Temp currently 98.3 degrees.  Denies any sick contacts or known exposure to COVID-19 or influenza.  No history of asthma or COPD but he does smoke.  States has been a little while since he has smoked.  He does not report any vomiting or diarrhea.  No other complaints.  HPI  Past Medical History:  Diagnosis Date   Abscess of right thigh 06/16/2015   Barrett esophagus    Bronchitis    Complication of anesthesia    hard to wake up after hernia surgery   Ear infection 12/13/2017   taking amoxicillin   Family history of adverse reaction to anesthesia    mom hard to wake up    GERD (gastroesophageal reflux disease)    Hydradenitis    Left leg   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/06/5282   Umbilical hernia without obstruction and without gangrene 11/22/2017    Patient Active Problem List   Diagnosis Date Noted   Tobacco abuse 02/17/2021   Family history of heart disease 02/17/2021   Essential hypertension 02/08/2021   Left sided abdominal pain of unknown cause 03/25/2020   Family history of colon cancer    Hidradenitis suppurativa 08/07/2015   GERD (gastroesophageal reflux disease) 06/16/2015    Past Surgical History:  Procedure Laterality Date   COLONOSCOPY WITH PROPOFOL N/A 03/06/2018   Procedure: COLONOSCOPY WITH PROPOFOL;  Surgeon: Lin Landsman, MD;  Location: [place];  Service: Gastroenterology;  Laterality: N/A;   ESOPHAGOGASTRODUODENOSCOPY (EGD) WITH PROPOFOL N/A 03/06/2018   Procedure:  ESOPHAGOGASTRODUODENOSCOPY (EGD) WITH PROPOFOL;  Surgeon: Lin Landsman, MD;  Location: [facility] ENDOSCOPY;  Service: Gastroenterology;  Laterality: N/A;   HERNIA REPAIR     INCISION AND DRAINAGE ABSCESS N/A 05/02/2020   Procedure: INCISION AND DRAINAGE ABSCESS;  Surgeon: Robert Bellow, MD;  Location: [facility];  Service: General;  Laterality: N/A;   INCISION AND DRAINAGE PERIRECTAL ABSCESS Right 06/16/2015   Procedure: IRRIGATION AND DEBRIDEMENT right inner thigh ABSCESS;  Surgeon: Florene Glen, MD;  Location: [facility];  Service: General;  Laterality: Right;   INSERTION OF MESH N/A 12/20/2017   Procedure: INSERTION OF MESH;  Surgeon: [name], MD;  Location: [facility];  Service: General;  Laterality: N/A;   KNEE ARTHROSCOPY WITH ANTERIOR CRUCIATE LIGAMENT (ACL) REPAIR Left 2002   KNEE SURGERY Left    PILONIDAL CYST EXCISION N/A 05/02/2020   Procedure: CYST EXCISION PILONIDAL EXTENSIVE;  Surgeon: Robert Bellow, MD;  Location: [facility];  Service: General;  Laterality: N/A;   UMBILICAL HERNIA REPAIR N/A 12/20/2017   Procedure: HERNIA REPAIR UMBILICAL ADULT;  Surgeon: [name], MD;  Location: [facility];  Service: General;  Laterality: N/A;       Home Medications    Prior to Admission medications   Medication Sig Start Date End Date Taking? Authorizing Provider  albuterol (VENTOLIN HFA) 108 (90 Base) MCG/ACT inhaler Inhale 1-2 puffs into the lungs every 6 (six) hours as needed for wheezing or shortness of  breath. 05/24/21  Yes Danton Clap, PA-C  aspirin EC 81 MG tablet Take 81 mg by mouth daily. Swallow whole.   Yes [provider]  azithromycin (ZITHROMAX) 250 MG tablet Take 1 tablet (250 mg total) by mouth daily. Take first 2 tablets together, then 1 every day until finished. 05/24/21  Yes Laurene Footman B, PA-C  brompheniramine-pseudoephedrine-DM 30-2-10 MG/5ML syrup Take 10 mLs by mouth 4 (four) times daily as needed for up to 7 days. 05/24/21  05/31/21 Yes Danton Clap, PA-C  predniSONE (DELTASONE) 20 MG tablet Take 2 tablets (40 mg total) by mouth daily for 5 days. 05/24/21 05/29/21 Yes Laurene Footman B, PA-C  amLODipine (NORVASC) 2.5 MG tablet Take 1 tablet (2.5 mg total) by mouth daily. 02/18/21   Crecencio Mc, MD  atorvastatin (LIPITOR) 40 MG tablet Take 1 tablet (40 mg total) by mouth daily. 04/09/21 07/08/21  Lorretta Harp, MD    Family History Family History  Problem Relation Age of Onset   Heart disease Father 75   Cancer Father 85       Lung Cancer   Cancer Paternal Grandfather 77       in the bones    Social History Social History   Tobacco Use   Smoking status: Every Day    Packs/day: 0.50    Years: 30.00    Pack years: 15.00    Types: Cigarettes   Smokeless tobacco: Never  Vaping Use   Vaping Use: Never used  Substance Use Topics   Alcohol use: Yes    Comment: social   Drug use: No     Allergies   Patient has no known allergies.   Review of Systems Review of Systems  Constitutional:  Positive for fatigue and fever.  HENT:  Positive for congestion and rhinorrhea. Negative for ear pain, sinus pressure, sinus pain and sore throat.   Respiratory:  Positive for cough and shortness of breath.   Cardiovascular:  Negative for chest pain.  Gastrointestinal:  Negative for abdominal pain, diarrhea, nausea and vomiting.  Musculoskeletal:  Positive for myalgias.  Neurological:  Negative for weakness, light-headedness and headaches.  Hematological:  Negative for adenopathy.    Physical Exam Triage Vital Signs ED Triage Vitals  Enc Vitals Group     BP 05/24/21 0844 125/86     Pulse Rate 05/24/21 0844 87     Resp 05/24/21 0844 16     Temp 05/24/21 0844 98.3 F (36.8 C)     Temp Source 05/24/21 0844 Oral     SpO2 05/24/21 0844 97 %     Weight 05/24/21 0841 235 lb (106.6 kg)     Height 05/24/21 0841 _0  (1.778 m)     Head Circumference --      Peak Flow --      Pain Score 05/24/21 0841 0      Pain Loc --      Pain Edu? --      Excl. in [place]? --    No data found.  Updated Vital Signs BP 125/86 (BP Location: Right Arm)   Pulse 87   Temp 98.3 F (36.8 C) (Oral)   Resp 16   Ht _1  (1.778 m)   Wt 235 lb (106.6 kg)   SpO2 97%   BMI 33.72 kg/m      Physical Exam Vitals and nursing note reviewed.  Constitutional:      General: He is not in acute distress.  Appearance: Normal appearance. He is well-developed. He is ill-appearing.  HENT:     Head: Normocephalic and atraumatic.     Nose: Congestion present.     Mouth/Throat:     Mouth: Mucous membranes are moist.     Pharynx: Oropharynx is clear.  Eyes:     General: No scleral icterus.    Conjunctiva/sclera: Conjunctivae normal.  Cardiovascular:     Rate and Rhythm: Normal rate and regular rhythm.     Heart sounds: Normal heart sounds.  Pulmonary:     Effort: Pulmonary effort is normal. No respiratory distress.     Breath sounds: Wheezing and rhonchi present.     Comments: Diffuse wheezing and rhonchi throughout all lung fields Musculoskeletal:     Cervical back: Neck supple.  Skin:    General: Skin is warm and dry.     Capillary Refill: Capillary refill takes less than 2 seconds.  Neurological:     General: No focal deficit present.     Mental Status: He is alert. Mental status is at baseline.     Motor: No weakness.     Coordination: Coordination normal.     Gait: Gait normal.  Psychiatric:        Mood and Affect: Mood normal.        Behavior: Behavior normal.        Thought Content: Thought content normal.     UC Treatments / Results  Labs (all labs ordered are listed, but only abnormal results are displayed) Labs Reviewed  SARS CORONAVIRUS 2 (TAT 6-24 HRS)    EKG   Radiology DG Chest 2 View  Result Date: 05/24/2021 CLINICAL DATA:  50 year old male with history of wheezing, cough, shortness of breath and fever for the past 4 days. EXAM: CHEST - 2 VIEW COMPARISON:  Chest x-ray  01/13/2018. FINDINGS: Lung volumes are normal. No consolidative airspace disease. However, there is diffuse peribronchial cuffing and widespread interstitial prominence, which appears to be chronic compared to the prior examination. Emphysematous changes are also noted, most evident in the upper lungs. No pleural effusions. No pneumothorax. No evidence of pulmonary edema. Heart size is normal. Upper mediastinal contours are within normal limits. IMPRESSION: 1. No definite radiographic evidence of acute cardiopulmonary disease. 2. Diffuse peribronchial cuffing and emphysematous changes, similar to the prior study, suggestive of chronic obstructive pulmonary disease (COPD). Electronically Signed   By: Vinnie Langton M.D.   On: 05/24/2021 09:39    Procedures Procedures (including critical care time)  Medications Ordered in UC Medications - No data to display  Initial Impression / Assessment and Plan / UC Course  I have reviewed the triage vital signs and the nursing notes.  Pertinent labs & imaging results that were available during my care of the patient were reviewed by me and considered in my medical decision making (see chart for details).   50 year old male presenting for approximately 4-day history of fever, fatigue, body aches, cough, congestion.  Patient reports taking over-the-counter medication for symptoms.  Reports a little shortness of breath.  No known history of asthma or COPD.  Vitals are stable.  He is ill-appearing but nontoxic.  No respiratory distress.  Exam does reveal nasal congestion.  Chest has diffuse wheezing and rhonchi throughout all lung fields.  Chest x-ray performed to assess for possible pneumonia.  X-ray does not show any evidence of pneumonia but it does show signs of COPD.  I discussed this with patient and advised him to stop smoking and follow-up  with his PCP.  Provided him with albuterol and prednisone and cough medication to help with his symptoms.  Advised an  increase rest and fluids.  I also discussed with him that when people have exacerbations of their COPD, if they have a productive cough we sometimes cover them with an antibiotic so I sent azithromycin for him.  PCR COVID test obtained.  Current CDC guidelines, isolation protocol and ED precautions reviewed if COVID-positive.  Patient given a work note.  Advised to follow-up as needed.  Did review ED precautions with him.   Final Clinical Impressions(s) / UC Diagnoses   Final diagnoses:  COPD exacerbation ([place])  Acute cough  Wheezing     Discharge Instructions      -I suspect that you likely either have flu or COVID.  We are testing for COVID headaches but holding off on testing for influenza since it would not change management as you are out of the window for treatment with antiviral medication to be of any benefit for influenza - I did a chest x-ray today to assess for possible pneumonia.  No evidence of pneumonia but it does show that you have COPD.  I printed more information out about this.  It is important that you follow-up with your PCP regarding this.  You may need to be on an inhaler.  I have sent albuterol for you to try.  When people have exacerbations of COPD, we often cover them with corticosteroids, inhalers and antibiotics so I have sent these medications to the pharmacy for you.  I have also sent a cough medication. - Increase rest and fluid intake. - If not feeling better in the next week or if you feel worse you should be seen again immediately.  For any uncontrollable fever or increase in breathing difficulty, please go to emergency department. -COVID test will be back tomorrow.  If you are positive you need to be isolated 5 days and wear a mask for 5 days.     ED Prescriptions     Medication Sig Dispense Auth. Provider   predniSONE (DELTASONE) 20 MG tablet Take 2 tablets (40 mg total) by mouth daily for 5 days. 10 tablet Laurene Footman B, PA-C    brompheniramine-pseudoephedrine-DM 30-2-10 MG/5ML syrup Take 10 mLs by mouth 4 (four) times daily as needed for up to 7 days. 150 mL Laurene Footman B, PA-C   albuterol (VENTOLIN HFA) 108 (90 Base) MCG/ACT inhaler Inhale 1-2 puffs into the lungs every 6 (six) hours as needed for wheezing or shortness of breath. 1 g Laurene Footman B, PA-C   azithromycin (ZITHROMAX) 250 MG tablet Take 1 tablet (250 mg total) by mouth daily. Take first 2 tablets together, then 1 every day until finished. 6 tablet Gretta Cool      PDMP not reviewed this encounter.   Danton Clap, PA-C 05/24/21 [phone]

## 2021-05-24 NOTE — Discharge Instructions (Signed)
-  I suspect that you likely either have flu or COVID.  We are testing for COVID headaches but holding off on testing for influenza since it would not change management as you are out of the window for treatment with antiviral medication to be of any benefit for influenza - I did a chest x-ray today to assess for possible pneumonia.  No evidence of pneumonia but it does show that you have COPD.  I printed more information out about this.  It is important that you follow-up with your PCP regarding this.  You may need to be on an inhaler.  I have sent albuterol for you to try.  When people have exacerbations of COPD, we often cover them with corticosteroids, inhalers and antibiotics so I have sent these medications to the pharmacy for you.  I have also sent a cough medication. - Increase rest and fluid intake. - If not feeling better in the next week or if you feel worse you should be seen again immediately.  For any uncontrollable fever or increase in breathing difficulty, please go to emergency department. -COVID test will be back tomorrow.  If you are positive you need to be isolated 5 days and wear a mask for 5 days.

## 2021-05-25 LAB — SARS CORONAVIRUS 2 (TAT 6-24 HRS): SARS Coronavirus 2: NEGATIVE

## 2021-06-14 ENCOUNTER — Other Ambulatory Visit: Payer: Self-pay | Admitting: Internal Medicine

## 2021-07-26 DIAGNOSIS — M25511 Pain in right shoulder: Secondary | ICD-10-CM | POA: Diagnosis not present

## 2021-07-31 DIAGNOSIS — M25511 Pain in right shoulder: Secondary | ICD-10-CM | POA: Diagnosis not present

## 2021-09-09 ENCOUNTER — Telehealth: Payer: Managed Care, Other (non HMO) | Admitting: Internal Medicine

## 2021-09-09 ENCOUNTER — Encounter: Payer: Self-pay | Admitting: Adult Health

## 2021-09-09 ENCOUNTER — Other Ambulatory Visit: Payer: Self-pay

## 2021-09-09 ENCOUNTER — Ambulatory Visit: Payer: BC Managed Care – PPO | Admitting: Adult Health

## 2021-09-09 VITALS — BP 146/90 | HR 80 | Temp 97.9°F | Ht 70.0 in | Wt 235.0 lb

## 2021-09-09 DIAGNOSIS — I10 Essential (primary) hypertension: Secondary | ICD-10-CM

## 2021-09-09 DIAGNOSIS — Z862 Personal history of diseases of the blood and blood-forming organs and certain disorders involving the immune mechanism: Secondary | ICD-10-CM

## 2021-09-09 DIAGNOSIS — R062 Wheezing: Secondary | ICD-10-CM

## 2021-09-09 DIAGNOSIS — R059 Cough, unspecified: Secondary | ICD-10-CM | POA: Diagnosis not present

## 2021-09-09 DIAGNOSIS — H6501 Acute serous otitis media, right ear: Secondary | ICD-10-CM | POA: Diagnosis not present

## 2021-09-09 DIAGNOSIS — Z91199 Patient's noncompliance with other medical treatment and regimen due to unspecified reason: Secondary | ICD-10-CM | POA: Insufficient documentation

## 2021-09-09 LAB — POC COVID19 BINAXNOW: SARS Coronavirus 2 Ag: NEGATIVE

## 2021-09-09 MED ORDER — AMOXICILLIN 875 MG PO TABS: 875.0000 mg | ORAL_TABLET | Freq: Two times a day (BID) | ORAL | 0 refills | Status: AC

## 2021-09-09 MED ORDER — PREDNISONE 10 MG (21) PO TBPK: ORAL_TABLET | ORAL | 0 refills | Status: DC

## 2021-09-09 NOTE — Addendum Note (Signed)
Addended by: Denita Lung A on: 09/09/2021 03:55 PM ? ? Modules accepted: Orders ? ?

## 2021-09-09 NOTE — Addendum Note (Signed)
Addended by: Doreen Beam on: 09/09/2021 04:34 PM ? ? Modules accepted: Orders ? ?

## 2021-09-09 NOTE — Progress Notes (Addendum)
? ?Acute Office Visit ? ?Subjective:  ? ? Patient ID: Bruce Graham, male    DOB: [birth year], 51 y.o.   MRN: [id number] ? ?Chief Complaint  ?Patient presents with  ? Follow-up  ?  Pt reports son with dx with Bronchitis. Patient woke up this morning feeling stuffed up, fatigued, temperature 99.9 .   ? ? ?HPI ?Patient is in today for fatigue that started yesterday and he started with nasal congestion today, green nasal discharge. He has had sinus congestion one week prior. Right ear itching and dullness.  ?Denies any wheezing.  ? ?History of pneumonia years ago.  ?Son with bronchitis.  ?Smoker.  ? ? ?Denies dizziness, lightheadedness, pre syncopal or syncopal episodes.  ?Patient  denies any fever, body aches,chills, rash, chest pain, shortness of breath, nausea, vomiting, or diarrhea.  ? ? ?Past Medical History:  ?Diagnosis Date  ? Abscess of right thigh 06/16/2015  ? Barrett esophagus   ? Bronchitis   ? Complication of anesthesia   ? hard to wake up after hernia surgery  ? Ear infection 12/13/2017  ? taking amoxicillin  ? Family history of adverse reaction to anesthesia   ? mom hard to wake up   ? GERD (gastroesophageal reflux disease)   ? Hydradenitis   ? Left leg  ? 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 Umbilical hernia without obstruction and without gangrene 11/22/2017  ? ? ?Past Surgical History:  ?Procedure Laterality Date  ? COLONOSCOPY WITH PROPOFOL N/A 03/06/2018  ? Procedure: COLONOSCOPY WITH PROPOFOL;  Surgeon: Lin Landsman, MD;  Location: [facility] ENDOSCOPY;  Service: Gastroenterology;  Laterality: N/A;  ? ESOPHAGOGASTRODUODENOSCOPY (EGD) WITH PROPOFOL N/A 03/06/2018  ? Procedure: ESOPHAGOGASTRODUODENOSCOPY (EGD) WITH PROPOFOL;  Surgeon: Lin Landsman, MD;  Location: [facility] ENDOSCOPY;  Service: Gastroenterology;  Laterality: N/A;  ? HERNIA REPAIR    ? INCISION AND DRAINAGE ABSCESS N/A 05/02/2020  ? Procedure: INCISION AND DRAINAGE ABSCESS;  Surgeon: Robert Bellow, MD;  Location: [facility] ORS;   Service: General;  Laterality: N/A;  ? INCISION AND DRAINAGE PERIRECTAL ABSCESS Right 06/16/2015  ? Procedure: IRRIGATION AND DEBRIDEMENT right inner thigh ABSCESS;  Surgeon: Florene Glen, MD;  Location: [facility] ORS;  Service: General;  Laterality: Right;  ? INSERTION OF MESH N/A 12/20/2017  ? Procedure: INSERTION OF MESH;  Surgeon: [name], MD;  Location: [facility] ORS;  Service: General;  Laterality: N/A;  ? KNEE ARTHROSCOPY WITH ANTERIOR CRUCIATE LIGAMENT (ACL) REPAIR Left 2002  ? KNEE SURGERY Left   ? PILONIDAL CYST EXCISION N/A 05/02/2020  ? Procedure: CYST EXCISION PILONIDAL EXTENSIVE;  Surgeon: Robert Bellow, MD;  Location: [facility] ORS;  Service: General;  Laterality: N/A;  ? UMBILICAL HERNIA REPAIR N/A 12/20/2017  ? Procedure: HERNIA REPAIR UMBILICAL ADULT;  Surgeon: [name], MD;  Location: [facility] ORS;  Service: General;  Laterality: N/A;  ? ? ?Family History  ?Problem Relation Age of Onset  ? Heart disease Father 57  ? Cancer Father 65  ?     Lung Cancer  ? Cancer Paternal Grandfather 24  ?     in the bones  ? ? ?Social History  ? ?Socioeconomic History  ? Marital status: Single  ?  Spouse name: Not on file  ? Number of children: Not on file  ? Years of education: Not on file  ? Highest education level: Not on file  ?Occupational History  ? Not on file  ?Tobacco Use  ? Smoking status: Every Day  ?  Packs/day: 0.50  ?  Years: 30.00  ?  Pack years: 15.00  ?  Types: Cigarettes  ? Smokeless tobacco: Never  ?Vaping Use  ? Vaping Use: Never used  ?Substance and Sexual Activity  ? Alcohol use: Yes  ?  Comment: social  ? Drug use: No  ? Sexual activity: Not on file  ?Other Topics Concern  ? Not on file  ?Social History Narrative  ? Not on file  ? ?Social Determinants of Health  ? ?Financial Resource Strain: Not on file  ?Food Insecurity: Not on file  ?Transportation Needs: Not on file  ?Physical Activity: Not on file  ?Stress: Not on file  ?Social Connections: Not on file  ?Intimate Partner  Violence: Not on file  ? ? ?Outpatient Medications Prior to Visit  ?Medication Sig Dispense Refill  ? albuterol (VENTOLIN HFA) 108 (90 Base) MCG/ACT inhaler Inhale 1-2 puffs into the lungs every 6 (six) hours as needed for wheezing or shortness of breath. 1 g 0  ? amLODipine (NORVASC) 2.5 MG tablet TAKE 1 TABLET BY MOUTH EVERY DAY 90 tablet 0  ? aspirin EC 81 MG tablet Take 81 mg by mouth daily. Swallow whole.    ? azithromycin (ZITHROMAX) 250 MG tablet Take 1 tablet (250 mg total) by mouth daily. Take first 2 tablets together, then 1 every day until finished. 6 tablet 0  ? atorvastatin (LIPITOR) 40 MG tablet Take 1 tablet (40 mg total) by mouth daily. 90 tablet 3  ? ?No facility-administered medications prior to visit.  ? ? ?No Known Allergies ? ?Review of Systems  ?Constitutional:  Positive for fatigue. Negative for activity change, appetite change, chills, diaphoresis, fever and unexpected weight change.  ?HENT:  Positive for congestion, ear pain, postnasal drip and rhinorrhea. Negative for facial swelling, nosebleeds, sinus pressure, sinus pain, sneezing, sore throat, tinnitus, trouble swallowing and voice change.   ?Respiratory:  Positive for cough and wheezing. Negative for apnea, choking, chest tightness, shortness of breath and stridor.   ?Cardiovascular: Negative.   ?Gastrointestinal: Negative.   ?Genitourinary: Negative.   ?Musculoskeletal: Negative.   ?Neurological: Negative.   ?Psychiatric/Behavioral: Negative.    ? ?   ?Objective:  ?  ?Physical Exam ?Vitals reviewed.  ?Constitutional:   ?   General: He is not in acute distress. ?   Appearance: He is obese. He is not ill-appearing, toxic-appearing or diaphoretic.  ?HENT:  ?   Head: Normocephalic and atraumatic.  ?   Jaw: There is normal jaw occlusion.  ?   Salivary Glands: Right salivary gland is not diffusely enlarged or tender. Left salivary gland is not diffusely enlarged or tender.  ?   Right Ear: Hearing, ear canal and external ear normal. No  drainage, swelling or tenderness. A middle ear effusion is present. There is no impacted cerumen. Tympanic membrane is erythematous and bulging. Tympanic membrane is not retracted.  ?   Left Ear: Tympanic membrane, ear canal and external ear normal. No drainage, swelling or tenderness.  No middle ear effusion. There is no impacted cerumen. Tympanic membrane is not erythematous, retracted or bulging.  ?   Nose: Congestion present. No rhinorrhea.  ?   Right Sinus: No maxillary sinus tenderness or frontal sinus tenderness.  ?   Left Sinus: No maxillary sinus tenderness or frontal sinus tenderness.  ?   Mouth/Throat:  ?   Mouth: Mucous membranes are moist.  ?   Pharynx: Uvula midline. Posterior oropharyngeal erythema present. No pharyngeal swelling, oropharyngeal exudate or uvula swelling.  ?  Tonsils: No tonsillar exudate or tonsillar abscesses. 0 on the right. 0 on the left.  ?Eyes:  ?   General: No scleral icterus.    ?   Right eye: No discharge.     ?   Left eye: No discharge.  ?   Extraocular Movements: Extraocular movements intact.  ?   Conjunctiva/sclera: Conjunctivae normal.  ?   Pupils: Pupils are equal, round, and reactive to light.  ?Cardiovascular:  ?   Rate and Rhythm: Normal rate and regular rhythm.  ?Pulmonary:  ?   Effort: Pulmonary effort is normal. No respiratory distress.  ?   Breath sounds: No stridor. Wheezing present. No rhonchi or rales.  ?Chest:  ?   Chest wall: No tenderness.  ?Abdominal:  ?   General: There is no distension.  ?   Palpations: Abdomen is soft.  ?   Tenderness: There is no abdominal tenderness.  ?Musculoskeletal:  ?   Cervical back: Normal range of motion and neck supple.  ?Lymphadenopathy:  ?   Cervical: No cervical adenopathy.  ? ? ?BP (!) 146/90 (BP Location: Left Arm, Patient Position: Sitting, Cuff Size: Large)   Pulse 80   Temp 97.9 ?F (36.6 ?C) (Oral)   Ht _0  (1.778 m)   Wt 235 lb (106.6 kg)   SpO2 98%   BMI 33.72 kg/m?  ?Wt Readings from Last 3 Encounters:   ?09/09/21 235 lb (106.6 kg)  ?05/24/21 235 lb (106.6 kg)  ?04/21/21 229 lb (103.9 kg)  ? ? ?There are no preventive care reminders to display for this patient. ? ?There are no preventive care reminders to display for this patient

## 2021-09-09 NOTE — Patient Instructions (Signed)
Otitis Media, Adult ?Otitis media is a condition in which the middle ear is red and swollen (inflamed) and full of fluid. The middle ear is the part of the ear that contains bones for hearing as well as air that helps send sounds to the brain. The condition usually goes away on its own. ?What are the causes? ?This condition is caused by a blockage in the eustachian tube. This tube connects the middle ear to the back of the nose. It normally allows air into the middle ear. The blockage is caused by fluid or swelling. Problems that can cause blockage include: ?A cold or infection that affects the nose, mouth, or throat. ?Allergies. ?An irritant, such as tobacco smoke. ?Adenoids that have become large. The adenoids are soft tissue located in the back of the throat, behind the nose and the roof of the mouth. ?Growth or swelling in the upper part of the throat, just behind the nose (nasopharynx). ?Damage to the ear caused by a change in pressure. This is called barotrauma. ?What increases the risk? ?You are more likely to develop this condition if you: ?Smoke or are exposed to tobacco smoke. ?Have an opening in the roof of your mouth (cleft palate). ?Have acid reflux. ?Have problems in your body's defense system (immune system). ?What are the signs or symptoms? ?Symptoms of this condition include: ?Ear pain. ?Fever. ?Problems with hearing. ?Being tired. ?Fluid leaking from the ear. ?Ringing in the ear. ?How is this treated? ?This condition can go away on its own within 3-5 days. But if the condition is caused by germs (bacteria) and does not go away on its own, or if it keeps coming back, your doctor may: ?Give you antibiotic medicines. ?Give you medicines for pain. ?Follow these instructions at home: ?Take over-the-counter and prescription medicines only as told by your doctor. ?If you were prescribed an antibiotic medicine, take it as told by your doctor. Do not stop taking it even if you start to feel better. ?Keep  all follow-up visits. ?Contact a doctor if: ?You have bleeding from your nose. ?There is a lump on your neck. ?You are not feeling better in 5 days. ?You feel worse instead of better. ?Get help right away if: ?You have pain that is not helped with medicine. ?You have swelling, redness, or pain around your ear. ?You get a stiff neck. ?You cannot move part of your face (paralysis). ?You notice that the bone behind your ear hurts when you touch it. ?You get a very bad headache. ?Summary ?Otitis media means that the middle ear is red, swollen, and full of fluid. ?This condition usually goes away on its own. ?If the problem does not go away, treatment may be needed. You may be given medicines to treat the infection or to treat your pain. ?If you were prescribed an antibiotic medicine, take it as told by your doctor. Do not stop taking it even if you start to feel better. ?Keep all follow-up visits. ?This information is not intended to replace advice given to you by your health care provider. Make sure you discuss any questions you have with your health care provider. ?Document Revised: 09/22/2020 Document Reviewed: 09/22/2020 ?Elsevier Patient Education ? [place]. ?Cough, Adult ?A cough helps to clear your throat and lungs. A cough may be a sign of an illness or another medical condition. ?An acute cough may only last 2-3 weeks, while a chronic cough may last 8 or more weeks. ?Many things can cause a cough.  They include: ?Germs (viruses or bacteria) that attack the airway. ?Breathing in things that bother (irritate) your lungs. ?Allergies. ?Asthma. ?Mucus that runs down the back of your throat (postnasal drip). ?Smoking. ?Acid backing up from the stomach into the tube that moves food from the mouth to the stomach (gastroesophageal reflux). ?Some medicines. ?Lung problems. ?Other medical conditions, such as heart failure or a blood clot in the lung (pulmonary embolism). ?Follow these instructions at  home: ?Medicines ?Take over-the-counter and prescription medicines only as told by your doctor. ?Talk with your doctor before you take medicines that stop a cough (cough suppressants). ?Lifestyle ? ?Do not smoke, and try not to be around smoke. Do not use any products that contain nicotine or tobacco, such as cigarettes, e-cigarettes, and chewing tobacco. If you need help quitting, ask your doctor. ?Drink enough fluid to keep your pee (urine) pale yellow. ?Avoid caffeine. ?Do not drink alcohol if your doctor tells you not to drink. ?General instructions ? ?Watch for any changes in your cough. Tell your doctor about them. ?Always cover your mouth when you cough. ?Stay away from things that make you cough, such as perfume, candles, campfire smoke, or cleaning products. ?If the air is dry, use a cool mist vaporizer or humidifier in your home. ?If your cough is worse at night, try using extra pillows to raise your head up higher while you sleep. ?Rest as needed. ?Keep all follow-up visits as told by your doctor. This is important. ?Contact a doctor if: ?You have new symptoms. ?You cough up pus. ?Your cough does not get better after 2-3 weeks, or your cough gets worse. ?Cough medicine does not help your cough and you are not sleeping well. ?You have pain that gets worse or pain that is not helped with medicine. ?You have a fever. ?You are losing weight and you do not know why. ?You have night sweats. ?Get help right away if: ?You cough up blood. ?You have trouble breathing. ?Your heartbeat is very fast. ?These symptoms may be an emergency. Do not wait to see if the symptoms will go away. Get medical help right away. Call your local emergency services (911 in the [place] Do not drive yourself to the hospital. ?Summary ?A cough helps to clear your throat and lungs. Many things can cause a cough. ?Take over-the-counter and prescription medicines only as told by your doctor. ?Always cover your mouth when you cough. ?Contact a  doctor if you have new symptoms or you have a cough that does not get better or gets worse. ?This information is not intended to replace advice given to you by your health care provider. Make sure you discuss any questions you have with your health care provider. ?Document Revised: 08/03/2019 Document Reviewed: 07/03/2018 ?Elsevier Patient Education ? [place]. ?Bronchospasm, Adult ?Bronchospasm is when the small airways in the lungs narrow. This can make it very hard to breathe. Swelling and more mucus than normal can add to this problem. ?What are the causes? ?Having a cold. ?Exercise. ?The smell from sprays, perfumes, candles, and cleaners. ?Cold air. ?Stress or strong feelings such as laughing or crying. ?What increases the risk? ?Having asthma. ?Smoking. ?Being around someone who smokes (secondhand smoke). ?Having allergies. ?Being allergic to certain foods, medicine, or bug bites or stings. ?What are the signs or symptoms? ?Making a high-pitched whistling sound when you breathe, most often when you breathe out (wheezing). ?Coughing. ?A tight feeling in your chest. ?Feeling like you cannot catch your breath. ?Feeling  like you have no energy to exercise. ?Breathing that is noisy. ?A cough that has a high pitch. ?How is this treated? ? ?Using medicines that you breathe in (inhale). These open up the airways and help you breathe. Medicines can be taken with a metered dose inhaler or a nebulizer device. ?Taking medicines to reduce swelling. ?Getting rid of what started the bronchospasm. ?Follow these instructions at home: ?Medicines ?Take over-the-counter and prescription medicines only as told by your doctor. ?If you need to use an inhaler or nebulizer to take your medicine, ask your doctor how to use it. ?You may be given a spacer to use with your inhaler. This makes it easier to get the medicine from the inhaler into your lungs. ?Lifestyle ?Do not smoke or use any products that contain nicotine or  tobacco. If you need help quitting, ask your doctor. ?Keep track of things that start your bronchospasm. Avoid these if you can. ?When pollen, air pollution, or humidity is bad, keep windows closed. Use an air conditio

## 2021-09-09 NOTE — Progress Notes (Signed)
Negative rapid covid, sent respiratory panel covid, flu, rsv.

## 2021-09-09 NOTE — Addendum Note (Signed)
Addended by: Leeanne Rio on: 09/09/2021 04:30 PM ? ? Modules accepted: Orders ? ?

## 2021-09-09 NOTE — Addendum Note (Signed)
Addended by: Denita Lung A on: 09/09/2021 04:03 PM ? ? Modules accepted: Orders ? ?

## 2021-09-10 ENCOUNTER — Telehealth: Payer: Self-pay | Admitting: Internal Medicine

## 2021-09-10 LAB — COMPREHENSIVE METABOLIC PANEL
ALT: 15 U/L (ref 0–53)
AST: 21 U/L (ref 0–37)
Albumin: 4.1 g/dL (ref 3.5–5.2)
Alkaline Phosphatase: 79 U/L (ref 39–117)
BUN: 13 mg/dL (ref 6–23)
CO2: 25 mEq/L (ref 19–32)
Calcium: 9.4 mg/dL (ref 8.4–10.5)
Chloride: 102 mEq/L (ref 96–112)
Creatinine, Ser: 1 mg/dL (ref 0.40–1.50)
GFR: 87.21 mL/min (ref >60.00)
Glucose, Bld: 81 mg/dL (ref 70–99)
Potassium: 3.8 mEq/L (ref 3.5–5.1)
Sodium: 136 mEq/L (ref 135–145)
Total Bilirubin: 0.3 mg/dL (ref 0.2–1.2)
Total Protein: 8.1 g/dL (ref 6.0–8.3)

## 2021-09-10 LAB — CBC WITH DIFFERENTIAL/PLATELET
Basophils Absolute: 0.1 10*3/uL (ref 0.0–0.1)
Basophils Relative: 0.6 % (ref 0.0–3.0)
Eosinophils Absolute: 0.1 10*3/uL (ref 0.0–0.7)
Eosinophils Relative: 0.9 % (ref 0.0–5.0)
HCT: 42.9 % (ref 39.0–52.0)
Hemoglobin: 14.6 g/dL (ref 13.0–17.0)
Lymphocytes Relative: 25.3 % (ref 12.0–46.0)
Lymphs Abs: 2.6 10*3/uL (ref 0.7–4.0)
MCHC: 34.1 g/dL (ref 30.0–36.0)
MCV: 83.1 fl (ref 78.0–100.0)
Monocytes Absolute: 0.9 10*3/uL (ref 0.1–1.0)
Monocytes Relative: 8.9 % (ref 3.0–12.0)
Neutro Abs: 6.6 10*3/uL (ref 1.4–7.7)
Neutrophils Relative %: 64.3 % (ref 43.0–77.0)
Platelets: 396 10*3/uL (ref 150.0–400.0)
RBC: 5.17 Mil/uL (ref 4.22–5.81)
RDW: 14.8 % (ref 11.5–15.5)
WBC: 10.2 10*3/uL (ref 4.0–10.5)

## 2021-09-10 NOTE — Progress Notes (Signed)
CBC and CMP within normal limits.

## 2021-09-10 NOTE — Telephone Encounter (Signed)
Called pt to schedule follow up appt.... Pt VM box is full and couldn't receive voicemail.  ?

## 2021-09-11 ENCOUNTER — Telehealth: Payer: Self-pay | Admitting: *Deleted

## 2021-09-11 LAB — COVID-19, FLU A+B AND RSV
Influenza A, NAA: NOT DETECTED
Influenza B, NAA: NOT DETECTED
RSV, NAA: NOT DETECTED
SARS-CoV-2, NAA: NOT DETECTED

## 2021-09-11 NOTE — Telephone Encounter (Signed)
CBC and CMP within normal limits.   ? Doreen Beam, FNP  ?09/09/2021  4:34 PM EDT   ?  ?Negative rapid covid, sent respiratory panel covid, flu, rsv.   ? ?

## 2021-09-11 NOTE — Telephone Encounter (Signed)
-----   Message from Doreen Beam, [place] sent at 09/10/2021 11:30 AM EDT ----- ?CBC and CMP within normal limits.  ?

## 2021-09-19 ENCOUNTER — Other Ambulatory Visit: Payer: Self-pay | Admitting: Internal Medicine

## 2021-10-26 ENCOUNTER — Encounter (HOSPITAL_COMMUNITY): Payer: Self-pay | Admitting: *Deleted

## 2021-10-26 ENCOUNTER — Other Ambulatory Visit: Payer: Self-pay

## 2021-10-26 ENCOUNTER — Ambulatory Visit (HOSPITAL_COMMUNITY)
Admission: EM | Admit: 2021-10-26 | Discharge: 2021-10-26 | Disposition: A | Discharge status: Home / Self Care | Payer: BC Managed Care – PPO | Attending: Family Medicine | Admitting: Family Medicine

## 2021-10-26 DIAGNOSIS — J069 Acute upper respiratory infection, unspecified: Secondary | ICD-10-CM | POA: Diagnosis not present

## 2021-10-26 DIAGNOSIS — J441 Chronic obstructive pulmonary disease with (acute) exacerbation: Secondary | ICD-10-CM

## 2021-10-26 MED ORDER — AMOXICILLIN 875 MG PO TABS: 875.0000 mg | ORAL_TABLET | Freq: Two times a day (BID) | ORAL | 0 refills | Status: DC

## 2021-10-26 MED ORDER — PREDNISONE 20 MG PO TABS: 40.0000 mg | ORAL_TABLET | Freq: Every day | ORAL | 0 refills | Status: AC

## 2021-10-26 NOTE — ED Provider Notes (Signed)
?[facility] ? ? ? ?CSN: [id number] ?Arrival date & time: 10/26/21  0900 ? ? ?  ? ?History   ?Chief Complaint ?Chief Complaint  ?Patient presents with  ? Fever  ? Cough  ? Nasal Congestion  ? ? ?HPI ?Bruce Graham is a 51 y.o. male.  ? ? ?Fever ?Associated symptoms: cough   ?Cough ?Associated symptoms: fever   ?Here for a history of cough, postnasal congestion and rhinorrhea, and fever to 99.5 since the evening of April 28.  No vomiting or diarrhea.  He has felt wheezing and congestion in his chest. ? ?He has cut down his smoking to 1/2 pack a day, and has been noted to have signs of COPD on chest x-rays previously in our system. ? ?Today he specifically requests amoxicillin, stating that it always clears him up.  Despite my talking with him about that this is mostly viral and that the main therapy he needs for his "bronchitis" is prednisone, he persists in his request for an antibiotic ? ?Past Medical History:  ?Diagnosis Date  ? Abscess of right thigh 06/16/2015  ? Barrett esophagus   ? Bronchitis   ? Complication of anesthesia   ? hard to wake up after hernia surgery  ? Ear infection 12/13/2017  ? taking amoxicillin  ? Family history of adverse reaction to anesthesia   ? mom hard to wake up   ? GERD (gastroesophageal reflux disease)   ? Hydradenitis   ? Left leg  ? 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 Umbilical hernia without obstruction and without gangrene 11/22/2017  ? ? ?Patient Active Problem List  ? Diagnosis Date Noted  ? No-show for appointment 09/09/2021  ? Non-recurrent acute serous otitis media of right ear 09/09/2021  ? History of leukocytosis 09/09/2021  ? Cough 09/09/2021  ? Wheezing 09/09/2021  ? Tobacco abuse 02/17/2021  ? Family history of heart disease 02/17/2021  ? Essential hypertension 02/08/2021  ? Left sided abdominal pain of unknown cause 03/25/2020  ? Family history of colon cancer   ? Hidradenitis suppurativa 08/07/2015  ? GERD (gastroesophageal reflux disease) 06/16/2015   ? ? ?Past Surgical History:  ?Procedure Laterality Date  ? COLONOSCOPY WITH PROPOFOL N/A 03/06/2018  ? Procedure: COLONOSCOPY WITH PROPOFOL;  Surgeon: Lin Landsman, MD;  Location: [facility] ENDOSCOPY;  Service: Gastroenterology;  Laterality: N/A;  ? ESOPHAGOGASTRODUODENOSCOPY (EGD) WITH PROPOFOL N/A 03/06/2018  ? Procedure: ESOPHAGOGASTRODUODENOSCOPY (EGD) WITH PROPOFOL;  Surgeon: Lin Landsman, MD;  Location: [facility] ENDOSCOPY;  Service: Gastroenterology;  Laterality: N/A;  ? HERNIA REPAIR    ? INCISION AND DRAINAGE ABSCESS N/A 05/02/2020  ? Procedure: INCISION AND DRAINAGE ABSCESS;  Surgeon: Robert Bellow, MD;  Location: [facility] ORS;  Service: General;  Laterality: N/A;  ? INCISION AND DRAINAGE PERIRECTAL ABSCESS Right 06/16/2015  ? Procedure: IRRIGATION AND DEBRIDEMENT right inner thigh ABSCESS;  Surgeon: Florene Glen, MD;  Location: [facility] ORS;  Service: General;  Laterality: Right;  ? INSERTION OF MESH N/A 12/20/2017  ? Procedure: INSERTION OF MESH;  Surgeon: [name], MD;  Location: [facility] ORS;  Service: General;  Laterality: N/A;  ? KNEE ARTHROSCOPY WITH ANTERIOR CRUCIATE LIGAMENT (ACL) REPAIR Left 2002  ? KNEE SURGERY Left   ? PILONIDAL CYST EXCISION N/A 05/02/2020  ? Procedure: CYST EXCISION PILONIDAL EXTENSIVE;  Surgeon: Robert Bellow, MD;  Location: [facility] ORS;  Service: General;  Laterality: N/A;  ? UMBILICAL HERNIA REPAIR N/A 12/20/2017  ? Procedure: HERNIA REPAIR UMBILICAL ADULT;  Surgeon: [name],  MD;  Location: [facility] ORS;  Service: General;  Laterality: N/A;  ? ? ? ? ? ?Home Medications   ? ?Prior to Admission medications   ?Medication Sig Start Date End Date Taking? Authorizing Provider  ?amoxicillin (AMOXIL) 875 MG tablet Take 1 tablet (875 mg total) by mouth 2 (two) times daily for 7 days. 10/26/21 11/02/21 Yes Aliana Kreischer, Gwenlyn Perking, MD  ?predniSONE (DELTASONE) 20 MG tablet Take 2 tablets (40 mg total) by mouth daily with breakfast for 5 days. 10/26/21 10/31/21 Yes Latrel Szymczak,  Gwenlyn Perking, MD  ?albuterol (VENTOLIN HFA) 108 (90 Base) MCG/ACT inhaler Inhale 1-2 puffs into the lungs every 6 (six) hours as needed for wheezing or shortness of breath. 05/24/21   Laurene Footman B, PA-C  ?amLODipine (NORVASC) 2.5 MG tablet TAKE 1 TABLET BY MOUTH EVERY DAY 09/20/21   Dutch Quint B, FNP  ?aspirin EC 81 MG tablet Take 81 mg by mouth daily. Swallow whole.    [provider]  ?atorvastatin (LIPITOR) 40 MG tablet Take 1 tablet (40 mg total) by mouth daily. 04/09/21 07/08/21  Lorretta Harp, MD  ? ? ?Family History ?Family History  ?Problem Relation Age of Onset  ? Heart disease Father 49  ? Cancer Father 9  ?     Lung Cancer  ? Cancer Paternal Grandfather 76  ?     in the bones  ? ? ?Social History ?Social History  ? ?Tobacco Use  ? Smoking status: Every Day  ?  Packs/day: 0.50  ?  Years: 30.00  ?  Pack years: 15.00  ?  Types: Cigarettes  ? Smokeless tobacco: Never  ?Vaping Use  ? Vaping Use: Never used  ?Substance Use Topics  ? Alcohol use: Yes  ?  Comment: social  ? Drug use: No  ? ? ? ?Allergies   ?Patient has no known allergies. ? ? ?Review of Systems ?Review of Systems  ?Constitutional:  Positive for fever.  ?Respiratory:  Positive for cough.   ? ? ?Physical Exam ?Triage Vital Signs ?ED Triage Vitals  ?Enc Vitals Group  ?   BP 10/26/21 0927 (!) 160/89  ?   Pulse Rate 10/26/21 0927 72  ?   Resp 10/26/21 0927 20  ?   Temp 10/26/21 0927 97.8 ?F (36.6 ?C)  ?   Temp src --   ?   SpO2 10/26/21 0927 98 %  ?   Weight --   ?   Height --   ?   Head Circumference --   ?   Peak Flow --   ?   Pain Score 10/26/21 0925 0  ?   Pain Loc --   ?   Pain Edu? --   ?   Excl. in [place]? --   ? ?No data found. ? ?Updated Vital Signs ?BP (!) 160/89   Pulse 72   Temp 97.8 ?F (36.6 ?C)   Resp 20   SpO2 98%  ? ?Visual Acuity ?Right Eye Distance:   ?Left Eye Distance:   ?Bilateral Distance:   ? ?Right Eye Near:   ?Left Eye Near:    ?Bilateral Near:    ? ?Physical Exam ?Vitals reviewed.  ?Constitutional:   ?    General: He is not in acute distress. ?   Appearance: He is not toxic-appearing.  ?HENT:  ?   Right Ear: Tympanic membrane and ear canal normal.  ?   Left Ear: Tympanic membrane and ear canal normal.  ?   Nose: Nose normal.  ?  Mouth/Throat:  ?   Mouth: Mucous membranes are moist.  ?   Pharynx: No posterior oropharyngeal erythema.  ?   Comments: Clear mucus is draining in the oropharynx ?Eyes:  ?   Extraocular Movements: Extraocular movements intact.  ?   Conjunctiva/sclera: Conjunctivae normal.  ?   Pupils: Pupils are equal, round, and reactive to light.  ?Cardiovascular:  ?   Rate and Rhythm: Normal rate and regular rhythm.  ?   Heart sounds: No murmur heard. ?Pulmonary:  ?   Effort: No respiratory distress.  ?   Breath sounds: No stridor. Wheezing (exp wheezes, with good air movement) present. No rhonchi or rales.  ?Musculoskeletal:  ?   Cervical back: Neck supple.  ?Lymphadenopathy:  ?   Cervical: No cervical adenopathy.  ?Skin: ?   Capillary Refill: Capillary refill takes less than 2 seconds.  ?   Coloration: Skin is not jaundiced or pale.  ?Neurological:  ?   General: No focal deficit present.  ?   Mental Status: He is alert and oriented to person, place, and time.  ?Psychiatric:     ?   Behavior: Behavior normal.  ? ? ? ?UC Treatments / Results  ?Labs ?(all labs ordered are listed, but only abnormal results are displayed) ?Labs Reviewed - No data to display ? ?EKG ? ? ?Radiology ?No results found. ? ?Procedures ?Procedures (including critical care time) ? ?Medications Ordered in UC ?Medications - No data to display ? ?Initial Impression / Assessment and Plan / UC Course  ?I have reviewed the triage vital signs and the nursing notes. ? ?Pertinent labs & imaging results that were available during my care of the patient were reviewed by me and considered in my medical decision making (see chart for details). ? ?  ? ?Encouragement given to continue with his efforts to quit smoking.  Prednisone for COPD  exacerbation.  I am printing a prescription for his Amoxil, and asking him to give the prednisone 24 hours to start helping before he decides to take it. ? ?We did not do COVID testing today. ?Final Clinical Impressions(s) / UC Diag

## 2021-10-26 NOTE — Discharge Instructions (Addendum)
Your symptoms are consistent with a viral upper respiratory infection, that then is causing exacerbation of emphysema or COPD ? ?Prednisone 20 mg--take 2 daily for 5 days.  This is to tamp down inflammation in your lungs. ? ?Continue using your albuterol as needed for shortness of breath or wheezing ? ? ?Please give the prednisone 24 hours to start helping you, before you decide to fill the amoxicillin.  Most of these type infections are viral in origin and do not need antibiotics to improve.  Please save the amoxicillin for other illnesses that actually require an antibiotic, if at all possible ?

## 2021-10-26 NOTE — ED Triage Notes (Signed)
Pt reports cough ,fever and congestion started Friday night. ?

## 2021-11-12 ENCOUNTER — Encounter: Payer: Self-pay | Admitting: Internal Medicine

## 2021-11-12 ENCOUNTER — Telehealth: Payer: Self-pay

## 2021-11-12 NOTE — Telephone Encounter (Signed)
Attempted to call pt for pre screen for appt in the morning. No answer. Mailbox full.

## 2021-11-13 ENCOUNTER — Ambulatory Visit: Payer: BC Managed Care – PPO | Admitting: Internal Medicine

## 2021-11-13 ENCOUNTER — Encounter: Payer: Self-pay | Admitting: Internal Medicine

## 2021-11-13 VITALS — BP 138/82 | HR 71 | Temp 97.7°F | Ht 70.0 in | Wt 230.2 lb

## 2021-11-13 DIAGNOSIS — E785 Hyperlipidemia, unspecified: Secondary | ICD-10-CM

## 2021-11-13 DIAGNOSIS — R5383 Other fatigue: Secondary | ICD-10-CM

## 2021-11-13 DIAGNOSIS — I10 Essential (primary) hypertension: Secondary | ICD-10-CM | POA: Diagnosis not present

## 2021-11-13 DIAGNOSIS — Z720 Tobacco use: Secondary | ICD-10-CM

## 2021-11-13 DIAGNOSIS — M5000 Cervical disc disorder with myelopathy, unspecified cervical region: Secondary | ICD-10-CM | POA: Insufficient documentation

## 2021-11-13 DIAGNOSIS — Z23 Encounter for immunization: Secondary | ICD-10-CM

## 2021-11-13 DIAGNOSIS — R7301 Impaired fasting glucose: Secondary | ICD-10-CM

## 2021-11-13 DIAGNOSIS — M791 Myalgia, unspecified site: Secondary | ICD-10-CM

## 2021-11-13 DIAGNOSIS — R931 Abnormal findings on diagnostic imaging of heart and coronary circulation: Secondary | ICD-10-CM

## 2021-11-13 DIAGNOSIS — L732 Hidradenitis suppurativa: Secondary | ICD-10-CM

## 2021-11-13 DIAGNOSIS — T466X5A Adverse effect of antihyperlipidemic and antiarteriosclerotic drugs, initial encounter: Secondary | ICD-10-CM

## 2021-11-13 LAB — LIPID PANEL
Cholesterol: 158 mg/dL (ref 0–200)
HDL: 46.2 mg/dL (ref >39.00)
LDL Cholesterol: 73 mg/dL (ref 0–99)
NonHDL: 111.51
Total CHOL/HDL Ratio: 3
Triglycerides: 192 mg/dL — ABNORMAL HIGH (ref 0.0–149.0)
VLDL: 38.4 mg/dL (ref 0.0–40.0)

## 2021-11-13 LAB — COMPREHENSIVE METABOLIC PANEL
ALT: 12 U/L (ref 0–53)
AST: 15 U/L (ref 0–37)
Albumin: 3.9 g/dL (ref 3.5–5.2)
Alkaline Phosphatase: 71 U/L (ref 39–117)
BUN: 13 mg/dL (ref 6–23)
CO2: 28 mEq/L (ref 19–32)
Calcium: 9.2 mg/dL (ref 8.4–10.5)
Chloride: 104 mEq/L (ref 96–112)
Creatinine, Ser: 0.96 mg/dL (ref 0.40–1.50)
GFR: 91.47 mL/min (ref >60.00)
Glucose, Bld: 73 mg/dL (ref 70–99)
Potassium: 4.4 mEq/L (ref 3.5–5.1)
Sodium: 141 mEq/L (ref 135–145)
Total Bilirubin: 0.3 mg/dL (ref 0.2–1.2)
Total Protein: 7.6 g/dL (ref 6.0–8.3)

## 2021-11-13 LAB — MICROALBUMIN / CREATININE URINE RATIO
Creatinine,U: 22.7 mg/dL
Microalb Creat Ratio: 3.1 mg/g (ref 0.0–30.0)
Microalb, Ur: <0.7 mg/dL (ref 0.0–1.9)

## 2021-11-13 MED ORDER — OMEPRAZOLE 40 MG PO CPDR: 40.0000 mg | DELAYED_RELEASE_CAPSULE | Freq: Every day | ORAL | 1 refills | Status: DC

## 2021-11-13 MED ORDER — AMOXICILLIN 875 MG PO TABS: 875.0000 mg | ORAL_TABLET | Freq: Two times a day (BID) | ORAL | 0 refills | Status: DC

## 2021-11-13 MED ORDER — EZETIMIBE 10 MG PO TABS: 10.0000 mg | ORAL_TABLET | Freq: Every day | ORAL | 2 refills | Status: DC

## 2021-11-13 NOTE — Assessment & Plan Note (Signed)
He has reudced his tobacco use from 3 packs daily to < 1 pack daily.  Encouraged to continue reducing Bruce Graham

## 2021-11-13 NOTE — Assessment & Plan Note (Signed)
Agrees to try zetia

## 2021-11-13 NOTE — Patient Instructions (Addendum)
For daily management of pain:    I recommend taking 2000 mg of acetominophen (tylenol) every day safely  In divided doses (500 mg every 6 hours  Or 1000 mg every 12 hours.)  this is safer than daily use of advil/motrin   Resume omeprazole to protect your stomach from the aspirin and advil/motrin  Because of the placque in your coronary arteries , you need to be on some type of preventive medication besides aspiring.  I 'm  glad you are  willing to try Zetia, you may tolerate it better than the statins that you did not tolerate previously.  It works by inhibiting the absorption of cholesterol by the small intestine, so it lowers LDL .

## 2021-11-13 NOTE — Assessment & Plan Note (Signed)
Flares now managed with amox 875 mg bid . Refill given

## 2021-11-13 NOTE — Assessment & Plan Note (Signed)
Reviewed today with patient.  He is stain intolerant due to myalgias.  Tobacco cessation , ASA and trial of zetia advised
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
## 2021-11-13 NOTE — Assessment & Plan Note (Signed)
Home readings without amlodipine have been < 140/80 consistently.
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
## 2021-11-15 ENCOUNTER — Other Ambulatory Visit: Payer: Self-pay

## 2021-11-15 ENCOUNTER — Ambulatory Visit
Admission: EM | Admit: 2021-11-15 | Discharge: 2021-11-15 | Disposition: A | Discharge status: Home / Self Care | Payer: BC Managed Care – PPO | Attending: Student | Admitting: Student

## 2021-11-15 DIAGNOSIS — K219 Gastro-esophageal reflux disease without esophagitis: Secondary | ICD-10-CM | POA: Diagnosis not present

## 2021-11-15 DIAGNOSIS — R066 Hiccough: Secondary | ICD-10-CM

## 2021-11-15 MED ORDER — LIDOCAINE VISCOUS HCL 2 % MT SOLN
15.0000 mL | Freq: Once | OROMUCOSAL | Status: AC
Start: 2021-11-15 — End: 2021-11-15
  Administered 2021-11-15: 15 mL via ORAL

## 2021-11-15 MED ORDER — ALUM & MAG HYDROXIDE-SIMETH 200-200-20 MG/5ML PO SUSP
30.0000 mL | Freq: Once | ORAL | Status: AC
  Administered 2021-11-15: 30 mL via ORAL

## 2021-11-15 MED ORDER — BACLOFEN 10 MG PO TABS: 10.0000 mg | ORAL_TABLET | Freq: Two times a day (BID) | ORAL | 0 refills | Status: DC

## 2021-11-15 NOTE — ED Provider Notes (Signed)
[facility]    CSN: [id number] Arrival date & time: 11/15/21  1001      History   Chief Complaint Chief Complaint  Patient presents with   Hiccups    HPI Bruce Graham is a 51 y.o. male presenting with hiccups for 5 days since 11/11/21.  History of GERD, takes Prilosec daily for this.  States the similar episode happened about 5 years ago, and resolved following GI cocktail.  This is not associated with shortness of breath, chest pain, dizziness, weakness, abdominal pain, back pain.  Has attempted various over-the-counter and home remedies including drinking pickle juice, holding breath without relief.  No known triggers.  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 400 11/13/2021   Intervertebral cervical disc disorder with myelopathy, 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/13/2021   No-show for appointment 09/09/2021   History of leukocytosis 09/09/2021   Tobacco abuse 02/17/2021   Family history of heart disease 02/17/2021   Essential hypertension 02/08/2021   Family history of colon cancer    Hidradenitis suppurativa 08/07/2015   GERD (gastroesophageal reflux disease) 06/16/2015    Past Surgical History:  Procedure Laterality Date   COLONOSCOPY WITH PROPOFOL N/A 03/06/2018   Procedure: COLONOSCOPY WITH PROPOFOL;  Surgeon: Lin Landsman, MD;  Location: [place];  Service:  Gastroenterology;  Laterality: N/A;   ESOPHAGOGASTRODUODENOSCOPY (EGD) WITH PROPOFOL N/A 03/06/2018   Procedure: ESOPHAGOGASTRODUODENOSCOPY (EGD) WITH PROPOFOL;  Surgeon: Lin Landsman, MD;  Location: [facility] ENDOSCOPY;  Service: Gastroenterology;  Laterality: N/A;   HERNIA REPAIR     INCISION AND DRAINAGE ABSCESS N/A 05/02/2020   Procedure: INCISION AND DRAINAGE ABSCESS;  Surgeon: Robert Bellow, MD;  Location: [facility];  Service: General;  Laterality: N/A;   INCISION AND DRAINAGE PERIRECTAL ABSCESS Right 06/16/2015   Procedure: IRRIGATION AND DEBRIDEMENT right inner thigh ABSCESS;  Surgeon: Florene Glen, MD;  Location: [facility];  Service: General;  Laterality: Right;   INSERTION OF MESH N/A 12/20/2017   Procedure: INSERTION OF MESH;  Surgeon: [name], MD;  Location: [facility];  Service: General;  Laterality: N/A;   KNEE ARTHROSCOPY WITH ANTERIOR CRUCIATE LIGAMENT (ACL) REPAIR Left 2002   KNEE SURGERY Left    PILONIDAL CYST EXCISION N/A 05/02/2020   Procedure: CYST EXCISION PILONIDAL EXTENSIVE;  Surgeon: Robert Bellow, MD;  Location: [facility];  Service: General;  Laterality: N/A;   UMBILICAL HERNIA REPAIR N/A 12/20/2017   Procedure: HERNIA REPAIR UMBILICAL ADULT;  Surgeon: [name], MD;  Location: [facility];  Service: General;  Laterality: N/A;       Home Medications    Prior to Admission medications   Medication Sig Start Date End Date Taking? Authorizing Provider  aspirin EC 81 MG tablet Take 81 mg by mouth daily. Swallow whole.   Yes [provider]  baclofen (  LIORESAL) 10 MG tablet Take 1 tablet (10 mg total) by mouth 2 (two) times daily. 11/15/21  Yes Hazel Sams, PA-C  omeprazole (PRILOSEC) 40 MG capsule Take 1 capsule (40 mg total) by mouth daily. 11/13/21  Yes Crecencio Mc, MD  ezetimibe (ZETIA) 10 MG tablet Take 1 tablet (10 mg total) by mouth daily. 11/13/21   Crecencio Mc, MD    Family History Family History  Problem  Relation Age of Onset   Heart disease Father 69   Cancer Father 68       Lung Cancer   Cancer Paternal Grandfather 47       in the bones    Social History Social History   Tobacco Use   Smoking status: Every Day    Packs/day: 0.50    Years: 30.00    Pack years: 15.00    Types: Cigarettes   Smokeless tobacco: Never  Vaping Use   Vaping Use: Never used  Substance Use Topics   Alcohol use: Yes    Comment: social   Drug use: No     Allergies   Patient has no known allergies.   Review of Systems Review of Systems  All other systems reviewed and are negative.   Physical Exam Triage Vital Signs ED Triage Vitals  Enc Vitals Group     BP 11/15/21 1022 (!) 145/89     Pulse Rate 11/15/21 1022 77     Resp 11/15/21 1022 18     Temp 11/15/21 1022 98.4 F (36.9 C)     Temp Source 11/15/21 1022 Oral     SpO2 11/15/21 1022 98 %     Weight 11/15/21 1021 235 lb (106.6 kg)     Height 11/15/21 1021 _0  (1.778 m)     Head Circumference --      Peak Flow --      Pain Score 11/15/21 1019 0     Pain Loc --      Pain Edu? --      Excl. in [place]? --    No data found.  Updated Vital Signs BP (!) 145/89 (BP Location: Right Arm)   Pulse 77   Temp 98.4 F (36.9 C) (Oral)   Resp 18   Ht _1  (1.778 m)   Wt 235 lb (106.6 kg)   SpO2 98%   BMI 33.72 kg/m   Visual Acuity Right Eye Distance:   Left Eye Distance:   Bilateral Distance:    Right Eye Near:   Left Eye Near:    Bilateral Near:     Physical Exam Vitals reviewed.  Constitutional:      General: He is not in acute distress.    Appearance: Normal appearance. He is not ill-appearing.     Comments: Frequent hiccups   HENT:     Head: Normocephalic and atraumatic.     Mouth/Throat:     Mouth: Mucous membranes are moist.     Comments: Moist mucous membranes Eyes:     Extraocular Movements: Extraocular movements intact.     Pupils: Pupils are equal, round, and reactive to light.  Cardiovascular:     Rate and  Rhythm: Normal rate and regular rhythm.     Heart sounds: Normal heart sounds.  Pulmonary:     Effort: Pulmonary effort is normal.     Breath sounds: Normal breath sounds. No wheezing, rhonchi or rales.  Abdominal:     General: Bowel sounds are normal. There is no  distension.     Palpations: Abdomen is soft. There is no mass.     Tenderness: There is no abdominal tenderness. There is no right CVA tenderness, left CVA tenderness, guarding or rebound.  Skin:    General: Skin is warm.     Capillary Refill: Capillary refill takes less than 2 seconds.     Comments: Good skin turgor  Neurological:     General: No focal deficit present.     Mental Status: He is alert and oriented to person, place, and time.  Psychiatric:        Mood and Affect: Mood normal.        Behavior: Behavior normal.     UC Treatments / Results  Labs (all labs ordered are listed, but only abnormal results are displayed) Labs Reviewed - No data to display  EKG   Radiology No results found.  Procedures Procedures (including critical care time)  Medications Ordered in UC Medications  alum & mag hydroxide-simeth (MAALOX/MYLANTA) 200-200-20 MG/5ML suspension 30 mL (30 mLs Oral Given 11/15/21 1037)    And  lidocaine (XYLOCAINE) 2 % viscous mouth solution 15 mL (15 mLs Oral Given 11/15/21 1036)    Initial Impression / Assessment and Plan / UC Course  I have reviewed the triage vital signs and the nursing notes.  Pertinent labs & imaging results that were available during my care of the patient were reviewed by me and considered in my medical decision making (see chart for details).     This patient is a very pleasant 51 y.o. year old male presenting with hiccups x5 days.  There is no associated abdominal pain, shortness of breath, chest pain, back pain.  He does have a history of GERD, taking omeprazole daily as directed.  Has not identified triggers, but similar episode happened about 5 years ago and  resolved with GI cocktail.  GI cocktail today administered without relief.  Patient states that his previous GI cocktail included an antispasmodic, which we do not carry here.  Per up-to-date recommendation, baclofen sent to pharmacy, which he can take for relief at home.  If symptoms worsen or persist, head to the emergency department..   Final Clinical Impressions(s) / UC Diagnoses   Final diagnoses:  Hiccups  Gastroesophageal reflux disease without esophagitis     Discharge Instructions      -Baclofen up to twice daily for hiccups. This should help with the muscle spasms  -Head to the ED if symptoms persist, or new symptoms like chest pain or shortness of breath      ED Prescriptions     Medication Sig Dispense Auth. Provider   baclofen (LIORESAL) 10 MG tablet Take 1 tablet (10 mg total) by mouth 2 (two) times daily. 20 each Hazel Sams, PA-C      PDMP not reviewed this encounter.   Hazel Sams, PA-C 11/15/21 1117

## 2021-11-15 NOTE — ED Triage Notes (Signed)
Patient is here for "Hiccups" since Wed. Of this week. "Having multiple in a row, that takes my breath".

## 2021-11-15 NOTE — Discharge Instructions (Addendum)
-  Baclofen up to twice daily for hiccups. This should help with the muscle spasms  -Head to the ED if symptoms persist, or new symptoms like chest pain or shortness of breath

## 2021-11-17 ENCOUNTER — Encounter (HOSPITAL_COMMUNITY): Payer: Self-pay | Admitting: Emergency Medicine

## 2021-11-17 ENCOUNTER — Ambulatory Visit (HOSPITAL_COMMUNITY)
Admission: EM | Admit: 2021-11-17 | Discharge: 2021-11-17 | Disposition: A | Discharge status: Home / Self Care | Payer: BC Managed Care – PPO | Attending: Family Medicine | Admitting: Family Medicine

## 2021-11-17 DIAGNOSIS — R066 Hiccough: Secondary | ICD-10-CM | POA: Diagnosis not present

## 2021-11-17 MED ORDER — METOCLOPRAMIDE HCL 10 MG PO TABS: 10.0000 mg | ORAL_TABLET | Freq: Four times a day (QID) | ORAL | 0 refills | Status: DC | PRN

## 2021-11-17 MED ORDER — ALUM & MAG HYDROXIDE-SIMETH 200-200-20 MG/5ML PO SUSP
30.0000 mL | Freq: Once | ORAL | Status: AC
  Administered 2021-11-17: 30 mL via ORAL

## 2021-11-17 MED ORDER — DICYCLOMINE HCL 20 MG PO TABS: 20.0000 mg | ORAL_TABLET | Freq: Four times a day (QID) | ORAL | 0 refills | Status: DC | PRN

## 2021-11-17 MED ORDER — LIDOCAINE VISCOUS HCL 2 % MT SOLN
15.0000 mL | Freq: Once | OROMUCOSAL | Status: AC
  Administered 2021-11-17: 15 mL via ORAL

## 2021-11-17 MED ORDER — ALUM & MAG HYDROXIDE-SIMETH 200-200-20 MG/5ML PO SUSP
ORAL | Status: AC
  Filled 2021-11-17: qty 30

## 2021-11-17 MED ORDER — LIDOCAINE VISCOUS HCL 2 % MT SOLN
OROMUCOSAL | Status: AC
  Filled 2021-11-17: qty 15

## 2021-11-17 NOTE — ED Provider Notes (Addendum)
[facility]    CSN: [id number] Arrival date & time: 11/17/21  1310      History   Chief Complaint Chief Complaint  Patient presents with   Hiccups    HPI Bruce Graham is a 51 y.o. male.   HPI Here for hiccups that began on May 17.  He will have to burp and then will have spasms of hiccuping that will last several hours.  He was seen here May 21.  He is already been taking omeprazole chronically.  He was prescribed baclofen on May 21, and that has not helped any.  No fever or vomiting or diarrhea.  He has had this once before, and at that time he was given a GI cocktail with an antispasmodic and it.  That took care of it for a long time.  We do not have dicyclomine and her GI cocktail here.  Nor do we have it to give orally from our medicine cabinet.  He was given an epidural steroid injection the day or so before this began bothering him.  When we gave him the GI cocktail on May 21, at first he did not feel that he had any relief.  After he left here he did however have about 3 hours where he did not have any reflux or hiccups.  Past Medical History:  Diagnosis Date   Abscess of right thigh 06/16/2015   Barrett esophagus    Bronchitis    Complication of anesthesia    hard to wake up after hernia surgery   Ear infection 12/13/2017   taking amoxicillin   Family history of adverse reaction to anesthesia    mom hard to wake up    GERD (gastroesophageal reflux disease)    Hydradenitis    Left leg   Suppurative hidradenitis 01/03/4695   Umbilical hernia without obstruction and without gangrene 11/22/2017    Patient Active Problem List   Diagnosis Date Noted   Myalgia due to statin 11/13/2021   Agatston coronary artery calcium score greater than 400 11/13/2021   Intervertebral cervical disc disorder with myelopathy, 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/13/2021   No-show for appointment 09/09/2021   History of leukocytosis 09/09/2021   Tobacco abuse 02/17/2021   Family history of  heart disease 02/17/2021   Essential hypertension 02/08/2021   Family history of colon cancer    Hidradenitis suppurativa 08/07/2015   GERD (gastroesophageal reflux disease) 06/16/2015    Past Surgical History:  Procedure Laterality Date   COLONOSCOPY WITH PROPOFOL N/A 03/06/2018   Procedure: COLONOSCOPY WITH PROPOFOL;  Surgeon: Lin Landsman, MD;  Location: [place];  Service: Gastroenterology;  Laterality: N/A;   ESOPHAGOGASTRODUODENOSCOPY (EGD) WITH PROPOFOL N/A 03/06/2018   Procedure: ESOPHAGOGASTRODUODENOSCOPY (EGD) WITH PROPOFOL;  Surgeon: Lin Landsman, MD;  Location: [facility];  Service: Gastroenterology;  Laterality: N/A;   HERNIA REPAIR     INCISION AND DRAINAGE ABSCESS N/A 05/02/2020   Procedure: INCISION AND DRAINAGE ABSCESS;  Surgeon: Robert Bellow, MD;  Location: [facility];  Service: General;  Laterality: N/A;   INCISION AND DRAINAGE PERIRECTAL ABSCESS Right 06/16/2015   Procedure: IRRIGATION AND DEBRIDEMENT right inner thigh ABSCESS;  Surgeon: Florene Glen, MD;  Location: [facility];  Service: General;  Laterality: Right;   INSERTION OF MESH N/A 12/20/2017   Procedure: INSERTION OF MESH;  Surgeon: [name], MD;  Location: [facility];  Service: General;  Laterality: N/A;   KNEE ARTHROSCOPY WITH ANTERIOR CRUCIATE LIGAMENT (ACL) REPAIR Left 2002   KNEE SURGERY  Left    PILONIDAL CYST EXCISION N/A 05/02/2020   Procedure: CYST EXCISION PILONIDAL EXTENSIVE;  Surgeon: Robert Bellow, MD;  Location: [facility];  Service: General;  Laterality: N/A;   UMBILICAL HERNIA REPAIR N/A 12/20/2017   Procedure: HERNIA REPAIR UMBILICAL ADULT;  Surgeon: [name], MD;  Location: [facility];  Service: General;  Laterality: N/A;       Home Medications    Prior to Admission medications   Medication Sig Start Date End Date Taking? Authorizing Provider  dicyclomine (BENTYL) 20 MG tablet Take 1 tablet (20 mg total) by mouth 4 (four) times daily as  needed for spasms (hiccups/cramps). 11/17/21  Yes Thorsten Climer, Gwenlyn Perking, MD  metoCLOPramide (REGLAN) 10 MG tablet Take 1 tablet (10 mg total) by mouth every 6 (six) hours as needed (hiccups). 11/17/21  Yes Barrett Henle, MD  aspirin EC 81 MG tablet Take 81 mg by mouth daily. Swallow whole.    [provider]  baclofen (LIORESAL) 10 MG tablet Take 1 tablet (10 mg total) by mouth 2 (two) times daily. 11/15/21   Hazel Sams, PA-C  ezetimibe (ZETIA) 10 MG tablet Take 1 tablet (10 mg total) by mouth daily. 11/13/21   Crecencio Mc, MD  omeprazole (PRILOSEC) 40 MG capsule Take 1 capsule (40 mg total) by mouth daily. 11/13/21   Crecencio Mc, MD    Family History Family History  Problem Relation Age of Onset   Heart disease Father 71   Cancer Father 87       Lung Cancer   Cancer Paternal Grandfather 21       in the bones    Social History Social History   Tobacco Use   Smoking status: Every Day    Packs/day: 0.50    Years: 30.00    Pack years: 15.00    Types: Cigarettes   Smokeless tobacco: Never  Vaping Use   Vaping Use: Never used  Substance Use Topics   Alcohol use: Yes    Comment: social   Drug use: No     Allergies   Patient has no known allergies.   Review of Systems Review of Systems   Physical Exam Triage Vital Signs ED Triage Vitals  Enc Vitals Group     BP 11/17/21 1338 (!) 141/93     Pulse Rate 11/17/21 1338 77     Resp 11/17/21 1338 17     Temp 11/17/21 1338 98.7 F (37.1 C)     Temp Source 11/17/21 1338 Oral     SpO2 11/17/21 1338 98 %     Weight --      Height --      Head Circumference --      Peak Flow --      Pain Score 11/17/21 1335 0     Pain Loc --      Pain Edu? --      Excl. in [place]? --    No data found.  Updated Vital Signs BP (!) 141/93 (BP Location: Right Arm)   Pulse 77   Temp 98.7 F (37.1 C) (Oral)   Resp 17   SpO2 98%   Visual Acuity Right Eye Distance:   Left Eye Distance:   Bilateral Distance:    Right  Eye Near:   Left Eye Near:    Bilateral Near:     Physical Exam Vitals reviewed.  Constitutional:      General: He is not in acute distress.  Appearance: He is not ill-appearing, toxic-appearing or diaphoretic.  HENT:     Nose: Nose normal.     Mouth/Throat:     Mouth: Mucous membranes are moist.     Pharynx: No oropharyngeal exudate or posterior oropharyngeal erythema.  Eyes:     Extraocular Movements: Extraocular movements intact.     Conjunctiva/sclera: Conjunctivae normal.     Pupils: Pupils are equal, round, and reactive to light.  Cardiovascular:     Rate and Rhythm: Normal rate.     Heart sounds: No murmur heard.    Comments: Rhythm is overall regular but he has an occasional 1 second pause in the heart rhythm. Pulmonary:     Effort: Pulmonary effort is normal.     Breath sounds: No stridor. No wheezing, rhonchi or rales.  Chest:     Chest wall: No tenderness.  Abdominal:     Palpations: Abdomen is soft.     Tenderness: There is no abdominal tenderness.  Musculoskeletal:     Cervical back: Neck supple.  Lymphadenopathy:     Cervical: No cervical adenopathy.  Neurological:     Mental Status: He is alert.     UC Treatments / Results  Labs (all labs ordered are listed, but only abnormal results are displayed) Labs Reviewed - No data to display  EKG   Radiology No results found.  Procedures Procedures (including critical care time)  Medications Ordered in UC Medications  alum & mag hydroxide-simeth (MAALOX/MYLANTA) 200-200-20 MG/5ML suspension 30 mL (has no administration in time range)    And  lidocaine (XYLOCAINE) 2 % viscous mouth solution 15 mL (has no administration in time range)    Initial Impression / Assessment and Plan / UC Course  I have reviewed the triage vital signs and the nursing notes.  Pertinent labs & imaging results that were available during my care of the patient were reviewed by me and considered in my medical decision making  (see chart for details).     He states he just had EKG and cardiac evaluation and all was good.  I therefore will not do an EKG today because of the slight irregularity in his heart rhythm.  Repeat the GI cocktail dose, and I am going to send him in dicyclomine for him to take as soon as he gets home.  I discussed with him the algorithm for treating hiccups, and I am also going to send in Reglan for him to take if the dicyclomine and GI cocktail does not take away the hiccups for good.  I also discussed with him either going to see his primary care office if this does not improve, or to the emergency room for possible imaging. Final Clinical Impressions(s) / UC Diagnoses   Final diagnoses:  Intractable hiccups     Discharge Instructions      You have been given a GI cocktail today, consisting of Maalox and lidocaine  Dicyclomine is the antispasmodic--take it 1 every 6 hours as needed for hiccuping  If that does not work then take metoclopramide 10 mg--1 every 6 hours as needed for hiccups.   Please proceed to the emergency room or see your primary care office if the hiccups are not improving in the next few days     ED Prescriptions     Medication Sig Dispense Auth. Provider   dicyclomine (BENTYL) 20 MG tablet Take 1 tablet (20 mg total) by mouth 4 (four) times daily as needed for spasms (hiccups/cramps). 20 tablet Juliane Poot  K, MD   metoCLOPramide (REGLAN) 10 MG tablet Take 1 tablet (10 mg total) by mouth every 6 (six) hours as needed (hiccups). 20 tablet Ketina Mars, Gwenlyn Perking, MD      PDMP not reviewed this encounter.   Barrett Henle, MD 11/17/21 1429    Barrett Henle, MD 11/17/21 1430

## 2021-11-17 NOTE — ED Triage Notes (Signed)
Pt reports had hiccups since Wed. They have gotten so bad they making him "lock up". Pt was given medications for muscle spasm over the weekend which didn't work.

## 2021-11-17 NOTE — Discharge Instructions (Addendum)
You have been given a GI cocktail today, consisting of Maalox and lidocaine  Dicyclomine is the antispasmodic--take it 1 every 6 hours as needed for hiccuping  If that does not work then take metoclopramide 10 mg--1 every 6 hours as needed for hiccups.   Please proceed to the emergency room or see your primary care office if the hiccups are not improving in the next few days

## 2021-12-04 ENCOUNTER — Encounter: Payer: Self-pay | Admitting: Family Medicine

## 2021-12-04 ENCOUNTER — Ambulatory Visit: Payer: BC Managed Care – PPO | Admitting: Family Medicine

## 2021-12-04 ENCOUNTER — Other Ambulatory Visit
Admission: RE | Admit: 2021-12-04 | Discharge: 2021-12-04 | Disposition: A | Discharge status: Home / Self Care | Payer: BC Managed Care – PPO | Source: Ambulatory Visit | Attending: Family Medicine | Admitting: Family Medicine

## 2021-12-04 VITALS — BP 120/80 | HR 54 | Temp 98.7°F | Ht 70.0 in | Wt 224.6 lb

## 2021-12-04 DIAGNOSIS — R197 Diarrhea, unspecified: Secondary | ICD-10-CM | POA: Diagnosis not present

## 2021-12-04 DIAGNOSIS — B309 Viral conjunctivitis, unspecified: Secondary | ICD-10-CM | POA: Diagnosis not present

## 2021-12-04 LAB — C DIFFICILE QUICK SCREEN W PCR REFLEX
C Diff antigen: NEGATIVE
C Diff interpretation: NOT DETECTED
C Diff toxin: NEGATIVE

## 2021-12-04 NOTE — Progress Notes (Signed)
Tommi Rumps, MD Phone: [phone]  Bruce Graham is a 51 y.o. male who presents today for same-day visit.  Diarrhea: Patient notes this started 5 days ago.  He is passing mostly water.  He has had some abdominal cramping with this.  He had minimal blood on 2 occasions earlier this week.  No nausea or vomiting.  No fevers.  No changes in foods.  He notes he recently started Zetia and omeprazole though he has not taken those this week and has been no change to his diarrhea.  No travel.  No contacts with similar symptoms though he does report one of his buddies daughters was recently diagnosed with C. difficile.  He reports his urine is yellow.  He is drinking a few bottles of water daily.  Conjunctivitis: Patient notes that his right eye was bothering him starting yesterday.  There was gunk in it this morning and it has been itchy.  No pain or photophobia.  Social History   Tobacco Use  Smoking Status Every Day   Packs/day: 0.50   Years: 30.00   Total pack years: 15.00   Types: Cigarettes  Smokeless Tobacco Never    Current Outpatient Medications on File Prior to Visit  Medication Sig Dispense Refill   aspirin EC 81 MG tablet Take 81 mg by mouth daily. Swallow whole.     baclofen (LIORESAL) 10 MG tablet Take 1 tablet (10 mg total) by mouth 2 (two) times daily. 20 each 0   bismuth subsalicylate (PEPTO BISMOL) 262 MG/15ML suspension Take 30 mLs by mouth every 6 (six) hours as needed.     dicyclomine (BENTYL) 20 MG tablet Take 1 tablet (20 mg total) by mouth 4 (four) times daily as needed for spasms (hiccups/cramps). 20 tablet 0   ezetimibe (ZETIA) 10 MG tablet Take 1 tablet (10 mg total) by mouth daily. 30 tablet 2   gabapentin (NEURONTIN) 300 MG capsule Take by mouth.     loperamide (IMODIUM) 2 MG capsule Take 4 mg by mouth as needed for diarrhea or loose stools ([facility]).     metoCLOPramide (REGLAN) 10 MG tablet Take 1 tablet (10 mg total) by mouth every 6 (six) hours as needed  (hiccups). 20 tablet 0   omeprazole (PRILOSEC) 40 MG capsule Take 1 capsule (40 mg total) by mouth daily. 90 capsule 1   No current facility-administered medications on file prior to visit.     ROS see history of present illness  Objective  Physical Exam Vitals:   12/04/21 1522  BP: 120/80  Pulse: (!) 54  Temp: 98.7 F (37.1 C)  SpO2: 97%    BP Readings from Last 3 Encounters:  12/04/21 120/80  11/17/21 (!) 141/93  11/15/21 (!) 145/89   Wt Readings from Last 3 Encounters:  12/04/21 224 lb 9.6 oz (101.9 kg)  11/15/21 235 lb (106.6 kg)  11/13/21 230 lb 3.2 oz (104.4 kg)    Physical Exam Constitutional:      General: He is not in acute distress.    Appearance: He is not diaphoretic.  Eyes:     Comments: Mild conjunctival erythema in the right eye, PERRL, right cornea appears grossly normal  Cardiovascular:     Rate and Rhythm: Normal rate and regular rhythm.     Heart sounds: Normal heart sounds.  Pulmonary:     Effort: Pulmonary effort is normal.     Breath sounds: Normal breath sounds.  Abdominal:     General: Bowel sounds are normal. There is no  distension.     Palpations: Abdomen is soft.     Tenderness: There is no abdominal tenderness. There is no guarding.     Comments: Slight discomfort on palpation of his entire abdomen  Skin:    General: Skin is warm and dry.  Neurological:     Mental Status: He is alert.      Assessment/Plan: Please see individual problem list.  Problem List Items Addressed This Visit     Diarrhea of presumed infectious origin - Primary    Discussed that I was most concerned that his diarrhea could be related to C. difficile.  Advised that we could not give him any medication to stop the diarrhea until we have ruled out C. difficile given the risk of toxic megacolon.  I encouraged him to take the stool sample to the hospital as soon as he is able to.  Discussed the potential for antibiotic treatment if the C. difficile test is  positive.  I encouraged adequate hydration.  Advised if he developed recurrent bleeding, increasing abdominal pain, fevers, or any new symptoms he should be reevaluated in person.      Relevant Orders   C Difficile Quick Screen w PCR reflex   Basic Metabolic Panel (BMET)   CBC w/Diff   Viral conjunctivitis    I suspect his right eye symptoms are related to viral conjunctivitis.  Discussed that this is a self-limited process and will take time to improve.  Advised to monitor for vision changes or photophobia or eye pain and if those occur he needs to be reevaluated in person.  Discussed use of warm and cool compresses to help with symptoms.      Return if symptoms worsen or fail to improve.   Tommi Rumps, MD [place]

## 2021-12-04 NOTE — Patient Instructions (Signed)
Nice to see you. Please take the stool sample to the lab at the hospital as soon as possible. Please try to drink plenty of water or other liquids that are noncaffeinated and nonalcoholic. If you develop abdominal pain, recurrent blood in your stool, fevers, or any new symptoms please seek medical attention. Your eye will likely improve on its own over a week or so.  If you develop eye pain, light sensitivity, or vision changes please seek medical attention.

## 2021-12-04 NOTE — Assessment & Plan Note (Addendum)
Discussed that I was most concerned that his diarrhea could be related to C. difficile.  Advised that we could not give him any medication to stop the diarrhea until we have ruled out C. difficile given the risk of toxic megacolon.  I encouraged him to take the stool sample to the hospital as soon as he is able to.  Discussed the potential for antibiotic treatment if the C. difficile test is positive.  I encouraged adequate hydration.  Advised if he developed recurrent bleeding, increasing abdominal pain, fevers, or any new symptoms he should be reevaluated in person.

## 2021-12-04 NOTE — Assessment & Plan Note (Signed)
I suspect his right eye symptoms are related to viral conjunctivitis.  Discussed that this is a self-limited process and will take time to improve.  Advised to monitor for vision changes or photophobia or eye pain and if those occur he needs to be reevaluated in person.  Discussed use of warm and cool compresses to help with symptoms.

## 2021-12-05 ENCOUNTER — Telehealth: Payer: Self-pay | Admitting: Family Medicine

## 2021-12-05 DIAGNOSIS — K529 Noninfective gastroenteritis and colitis, unspecified: Secondary | ICD-10-CM

## 2021-12-05 NOTE — Telephone Encounter (Signed)
I attempted to call the patient with his c diff results. There was no answer and his mail box was full. I will attempt to contact him again later today.

## 2021-12-05 NOTE — Telephone Encounter (Signed)
Spoke with the patient. I advised him of his negative c diff test. He noted he was doing quite a bit better. His stool is now more solid and improving. Discussed continuing to monitor and if it goes back to being watery he needs to let us know so we can treat appropriately. I encouraged adequate hydration. He noted he has been urinating well.

## 2021-12-07 ENCOUNTER — Other Ambulatory Visit: Payer: Self-pay | Admitting: *Deleted

## 2021-12-07 ENCOUNTER — Other Ambulatory Visit: Payer: Self-pay | Admitting: Internal Medicine

## 2021-12-07 ENCOUNTER — Other Ambulatory Visit (INDEPENDENT_AMBULATORY_CARE_PROVIDER_SITE_OTHER): Payer: BC Managed Care – PPO

## 2021-12-07 DIAGNOSIS — R197 Diarrhea, unspecified: Secondary | ICD-10-CM

## 2021-12-07 LAB — BASIC METABOLIC PANEL
BUN: 6 mg/dL (ref 6–23)
CO2: 27 mEq/L (ref 19–32)
Calcium: 8.6 mg/dL (ref 8.4–10.5)
Chloride: 102 mEq/L (ref 96–112)
Creatinine, Ser: 0.84 mg/dL (ref 0.40–1.50)
GFR: 100.87 mL/min (ref >60.00)
Glucose, Bld: 97 mg/dL (ref 70–99)
Potassium: 3.6 mEq/L (ref 3.5–5.1)
Sodium: 139 mEq/L (ref 135–145)

## 2021-12-07 LAB — CBC WITH DIFFERENTIAL/PLATELET
Basophils Absolute: 0.1 10*3/uL (ref 0.0–0.2)
Basos: 0 %
EOS (ABSOLUTE): 0.2 10*3/uL (ref 0.0–0.4)
Eos: 1 %
Hematocrit: 38.5 % (ref 37.5–51.0)
Hemoglobin: 13.2 g/dL (ref 13.0–17.7)
Immature Grans (Abs): 0 10*3/uL (ref 0.0–0.1)
Immature Granulocytes: 0 %
Lymphocytes Absolute: 3.3 10*3/uL — ABNORMAL HIGH (ref 0.7–3.1)
Lymphs: 28 %
MCH: 27.5 pg (ref 26.6–33.0)
MCHC: 34.3 g/dL (ref 31.5–35.7)
MCV: 80 fL (ref 79–97)
Monocytes Absolute: 1.6 10*3/uL — ABNORMAL HIGH (ref 0.1–0.9)
Monocytes: 14 %
Neutrophils Absolute: 6.6 10*3/uL (ref 1.4–7.0)
Neutrophils: 57 %
Platelets: 460 10*3/uL — ABNORMAL HIGH (ref 150–450)
RBC: 4.8 x10E6/uL (ref 4.14–5.80)
RDW: 13.6 % (ref 11.6–15.4)
WBC: 11.7 10*3/uL — ABNORMAL HIGH (ref 3.4–10.8)

## 2021-12-07 MED ORDER — METRONIDAZOLE 500 MG PO TABS: 500.0000 mg | ORAL_TABLET | Freq: Three times a day (TID) | ORAL | 0 refills | Status: AC

## 2021-12-07 NOTE — Addendum Note (Signed)
Addended by: Leeanne Rio on: 12/07/2021 12:55 PM   Modules accepted: Orders

## 2021-12-07 NOTE — Addendum Note (Signed)
Addended by: Leeanne Rio on: 12/07/2021 12:16 PM   Modules accepted: Orders

## 2021-12-07 NOTE — Telephone Encounter (Signed)
Spoke with pt and advised him of the medication that has been sent in for him to start tonight. Pt gave a verbal understanding. Pt was advised to keep his appt with you on Wednesday. Pt stated that he has not been able to eat much but when he does eat a little something the diarrhea does get a little more formed otherwise it is watery and sometimes looks like it has blood in it. Pt also stated that he has been having abdominal cramping that moves form the abdomen to his back.

## 2021-12-07 NOTE — Telephone Encounter (Signed)
Pt came in for a redraw of labs & I notified him that Dr. Caryl Bis is not in the office today. Pt asked if I could let Dr. Derrel Nip see this message since she is his PCP.

## 2021-12-07 NOTE — Telephone Encounter (Signed)
Spoke with pt and scheduled him for 4:45 virtual on Wednesday. Pt would like to know what he can take to slow down the diarrhea. Pt stated that he is unable to work because he his going to the bathroom twice every hour.

## 2021-12-07 NOTE — Telephone Encounter (Signed)
Pt called stating he is still not feeling well and request antibiotics to be prescribed

## 2021-12-08 ENCOUNTER — Telehealth: Payer: Self-pay | Admitting: Internal Medicine

## 2021-12-08 ENCOUNTER — Other Ambulatory Visit: Payer: Self-pay

## 2021-12-08 DIAGNOSIS — D72829 Elevated white blood cell count, unspecified: Secondary | ICD-10-CM

## 2021-12-08 DIAGNOSIS — K5100 Ulcerative (chronic) pancolitis without complications: Secondary | ICD-10-CM | POA: Insufficient documentation

## 2021-12-08 DIAGNOSIS — K529 Noninfective gastroenteritis and colitis, unspecified: Secondary | ICD-10-CM | POA: Insufficient documentation

## 2021-12-08 MED ORDER — CIPROFLOXACIN HCL 500 MG PO TABS: 500.0000 mg | ORAL_TABLET | Freq: Two times a day (BID) | ORAL | 0 refills | Status: AC

## 2021-12-08 NOTE — Telephone Encounter (Signed)
Pt called requesting a doctor note for today and tomorrow

## 2021-12-08 NOTE — Assessment & Plan Note (Signed)
Likely diverticulitis given negative C dif,  length of duration, and presence of "tics" on 2019 colonosopyc,  Cipro/flagyl prescribed.

## 2021-12-08 NOTE — Telephone Encounter (Signed)
Letter created and I am sending to patient's email as asked.

## 2021-12-08 NOTE — Telephone Encounter (Signed)
If okay I will type up the letter

## 2021-12-08 NOTE — Telephone Encounter (Signed)
Spoke with pt and informed him of the additional medication that has been added. Pt gave a verbal understanding.

## 2021-12-08 NOTE — Addendum Note (Signed)
Addended by: Crecencio Mc on: 12/08/2021 09:27 AM   Modules accepted: Orders

## 2021-12-09 ENCOUNTER — Telehealth (INDEPENDENT_AMBULATORY_CARE_PROVIDER_SITE_OTHER): Payer: BC Managed Care – PPO | Admitting: Internal Medicine

## 2021-12-09 ENCOUNTER — Encounter: Payer: Self-pay | Admitting: Internal Medicine

## 2021-12-09 VITALS — Ht 70.0 in | Wt 224.6 lb

## 2021-12-09 DIAGNOSIS — K529 Noninfective gastroenteritis and colitis, unspecified: Secondary | ICD-10-CM

## 2021-12-09 DIAGNOSIS — R197 Diarrhea, unspecified: Secondary | ICD-10-CM

## 2021-12-09 NOTE — Assessment & Plan Note (Addendum)
ddx includes c dif,  Other bacterial etiologies  Diverticulitis.  Collagenous colitis.  Stool  Studies  CT ab pelvis ordered  With report suggestive of colitis vs neoplasm due to edema without evidence of diverticulitis. Referral to dr Bary Castilla In process for colonoscopy

## 2021-12-09 NOTE — Progress Notes (Addendum)
Virtual Visit via [place] Note  This visit type was conducted in a format is felt to be most appropriate for this patient at this time, due to profuse water ucontrollable diarrhea .  All issues noted in this document were discussed and addressed.  No physical exam was performed (except for noted visual exam findings with Video Visits).   I connected with Bruce Graham  on 12/09/21 at  4:45 PM EDT by a video enabled telemedicine application and verified that I am speaking with the correct person using two identifiers. Location patient: home Location provider: work or home office Persons participating in the virtual visit: patient, provider  I discussed the limitations, risks, security and privacy concerns of performing an evaluation and management service by telephone and the availability of in person appointments. I also discussed with the patient that there may be a patient responsible charge related to this service. The patient expressed understanding and agreed to proceed.  Reason for visit: uncontrollable diarrhea   HPI:  51 yr old male with diverticulosis by 2019 colonoscopy presents with 10 day history of watery diarrhea that began on or around June 5.  The diarrhea is accompanied by lower abdominal cramping that "moves around,"  bright red blood at times  and did not respond to pepto bismol or Imodium.  He was sent for C dif testing after a virtual visit with ES on June 9, which was negative. No other tests on stool were done.  Due to persistence in watery uncontrolled diarrhea he was started on flagyl 500 mg tid 48 hours ago, and cipro was added 24 hours ago.  He has been drinking Sugar Free Gatorade and eating a regular diet, when tolerated,  but having uncontrolled watery, slimy stools after eating.  He has been able to lie down for 3 hours at a time with temporary cessation of stooling,  bu as soon as he is awake and active the diarrhea returns.   ROS: See pertinent positives and  negatives per HPI.  Past Medical History:  Diagnosis Date   Abscess of right thigh 06/16/2015   Barrett esophagus    Bronchitis    Complication of anesthesia    hard to wake up after hernia surgery   Ear infection 12/13/2017   taking amoxicillin   Family history of adverse reaction to anesthesia    mom hard to wake up    GERD (gastroesophageal reflux disease)    Hydradenitis    Left leg   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/28/2949   Umbilical hernia without obstruction and without gangrene 11/22/2017    Past Surgical History:  Procedure Laterality Date   COLONOSCOPY WITH PROPOFOL N/A 03/06/2018   Procedure: COLONOSCOPY WITH PROPOFOL;  Surgeon: Lin Landsman, MD;  Location: [facility] ENDOSCOPY;  Service: Gastroenterology;  Laterality: N/A;   ESOPHAGOGASTRODUODENOSCOPY (EGD) WITH PROPOFOL N/A 03/06/2018   Procedure: ESOPHAGOGASTRODUODENOSCOPY (EGD) WITH PROPOFOL;  Surgeon: Lin Landsman, MD;  Location: [facility] ENDOSCOPY;  Service: Gastroenterology;  Laterality: N/A;   HERNIA REPAIR     INCISION AND DRAINAGE ABSCESS N/A 05/02/2020   Procedure: INCISION AND DRAINAGE ABSCESS;  Surgeon: Robert Bellow, MD;  Location: [facility] ORS;  Service: General;  Laterality: N/A;   INCISION AND DRAINAGE PERIRECTAL ABSCESS Right 06/16/2015   Procedure: OACZYSAYTK ZSW DEBRIDEMENT right inner thigh ABSCESS;  Surgeon: Florene Glen, MD;  Location: [facility] ORS;  Service: General;  Laterality: Right;   INSERTION OF MESH N/A 12/20/2017   Procedure: INSERTION OF MESH;  Surgeon: [name], MD;  Location: [facility] ORS;  Service: General;  Laterality: N/A;   KNEE ARTHROSCOPY WITH ANTERIOR CRUCIATE LIGAMENT (ACL) REPAIR Left 2002   KNEE SURGERY Left    PILONIDAL CYST EXCISION N/A 05/02/2020   Procedure: CYST EXCISION PILONIDAL EXTENSIVE;  Surgeon: Robert Bellow, MD;  Location: [facility] ORS;  Service: General;  Laterality: N/A;   UMBILICAL HERNIA REPAIR N/A 12/20/2017   Procedure: HERNIA REPAIR UMBILICAL ADULT;   Surgeon: [name], MD;  Location: [facility] ORS;  Service: General;  Laterality: N/A;    Family History  Problem Relation Age of Onset   Heart disease Father 24   Cancer Father 44       Lung Cancer   Cancer Paternal Grandfather 27       in the bones    SOCIAL HX:  reports that he has been smoking cigarettes. He has a 15.00 pack-year smoking history. He has never used smokeless tobacco. He reports current alcohol use. He reports that he does not use drugs.    Current Outpatient Medications:    aspirin EC 81 MG tablet, Take 81 mg by mouth daily. Swallow whole., Disp: , Rfl:    baclofen (LIORESAL) 10 MG tablet, Take 1 tablet (10 mg total) by mouth 2 (two) times daily., Disp: 20 each, Rfl: 0   ciprofloxacin (CIPRO) 500 MG tablet, Take 1 tablet (500 mg total) by mouth 2 (two) times daily for 7 days., Disp: 14 tablet, Rfl: 0   ezetimibe (ZETIA) 10 MG tablet, Take 1 tablet (10 mg total) by mouth daily., Disp: 30 tablet, Rfl: 2   gabapentin (NEURONTIN) 300 MG capsule, Take by mouth., Disp: , Rfl:    loperamide (IMODIUM) 2 MG capsule, Take 4 mg by mouth as needed for diarrhea or loose stools (OVC)., Disp: , Rfl:    metoCLOPramide (REGLAN) 10 MG tablet, Take 1 tablet (10 mg total) by mouth every 6 (six) hours as needed (hiccups)., Disp: 20 tablet, Rfl: 0   metroNIDAZOLE (FLAGYL) 500 MG tablet, Take 1 tablet (500 mg total) by mouth 3 (three) times daily for 7 days., Disp: 21 tablet, Rfl: 0   omeprazole (PRILOSEC) 40 MG capsule, Take 1 capsule (40 mg total) by mouth daily., Disp: 90 capsule, Rfl: 1   potassium chloride 20 MEQ/15ML (10%) SOLN, Take 15 mLs (20 mEq total) by mouth daily for 4 days., Disp: 60 mL, Rfl: 0  EXAM:  VITALS per patient if applicable:  GENERAL: alert, oriented, appears well and in no acute distress  HEENT: atraumatic, conjunttiva clear, no obvious abnormalities on inspection of external nose and ears  NECK: normal movements of the head and neck  LUNGS: on  inspection no signs of respiratory distress, breathing rate appears normal, no obvious gross SOB, gasping or wheezing  CV: no obvious cyanosis  MS: moves all visible extremities without noticeable abnormality  PSYCH/NEURO: pleasant and cooperative, no obvious depression or anxiety, speech and thought processing grossly intact  ASSESSMENT AND PLAN:  Discussed the following assessment and plan:  Diarrhea of presumed infectious origin - Plan: Magnesium, CBC with Differential/Platelet, Comprehensive metabolic panel, Sedimentation rate, C-reactive protein, Gastrointestinal Panel by PCR , Stool, C Difficile Quick Screen w PCR reflex, CT ABDOMEN PELVIS W CONTRAST, Ova and parasite examination, Stool culture, CANCELED: Ova and parasite examination, CANCELED: Stool culture, CANCELED: Ova and parasite examination  Colitis  Colitis ddx includes c dif,  Other bacterial etiologies  Diverticulitis.  Collagenous colitis.  Stool  Studies  CT ab pelvis ordered  With report suggestive of  colitis vs neoplasm due to edema without evidence of diverticulitis. Referral to dr Bary Castilla In process for colonoscopy    I discussed the assessment and treatment plan with the patient. The patient was provided an opportunity to ask questions and all were answered. The patient agreed with the plan and demonstrated an understanding of the instructions.   The patient was advised to call back or seek an in-person evaluation if the symptoms worsen or if the condition fails to improve as anticipated.   I spent 40 minutes dedicated to the care of this patient on the date of this encounter to include pre-visit review of his medical history,  Face-to-face time with the patient , and post visit ordering of testing and therapeutics.    Crecencio Mc, MD

## 2021-12-09 NOTE — Patient Instructions (Addendum)
SIMPLIFY YOUR DIET:  SALTINES  CHICKEN BROTH , RICE,  JELLO, TOAST (NO BUTTER)   NO CHEESE, NO OILS,  NO ARTIFICIAL SWEETENERS  NO VEGETABLES OR LETTUCE   COME TO THE OFFICE IN THE MORNING FOR BLOOD TESTS  PICK UP THE STOOL KIT AND TAKE IT HOME TO COLLECT SPECIMENS  TAKE THE SPECIMENS TO THE [facility]    CT SCAN HAS BEEN ORDERED    CONTINUE THE CIPRO AND THE FLAGYL

## 2021-12-10 ENCOUNTER — Other Ambulatory Visit
Admission: RE | Admit: 2021-12-10 | Discharge: 2021-12-10 | Disposition: A | Discharge status: Home / Self Care | Payer: BC Managed Care – PPO | Source: Ambulatory Visit | Attending: Internal Medicine | Admitting: Internal Medicine

## 2021-12-10 ENCOUNTER — Other Ambulatory Visit (INDEPENDENT_AMBULATORY_CARE_PROVIDER_SITE_OTHER): Payer: BC Managed Care – PPO

## 2021-12-10 ENCOUNTER — Ambulatory Visit
Admission: RE | Admit: 2021-12-10 | Discharge: 2021-12-10 | Disposition: A | Discharge status: Home / Self Care | Payer: BC Managed Care – PPO | Source: Ambulatory Visit | Attending: Internal Medicine | Admitting: Internal Medicine

## 2021-12-10 DIAGNOSIS — K529 Noninfective gastroenteritis and colitis, unspecified: Secondary | ICD-10-CM

## 2021-12-10 DIAGNOSIS — R197 Diarrhea, unspecified: Secondary | ICD-10-CM | POA: Insufficient documentation

## 2021-12-10 DIAGNOSIS — I700 Atherosclerosis of aorta: Secondary | ICD-10-CM | POA: Diagnosis not present

## 2021-12-10 DIAGNOSIS — E876 Hypokalemia: Secondary | ICD-10-CM

## 2021-12-10 DIAGNOSIS — K6389 Other specified diseases of intestine: Secondary | ICD-10-CM | POA: Diagnosis not present

## 2021-12-10 LAB — CBC WITH DIFFERENTIAL/PLATELET
Basophils Absolute: 0 10*3/uL (ref 0.0–0.1)
Basophils Relative: 0.4 % (ref 0.0–3.0)
Eosinophils Absolute: 0.2 10*3/uL (ref 0.0–0.7)
Eosinophils Relative: 1.5 % (ref 0.0–5.0)
HCT: 36.8 % — ABNORMAL LOW (ref 39.0–52.0)
Hemoglobin: 12.2 g/dL — ABNORMAL LOW (ref 13.0–17.0)
Lymphocytes Relative: 19.9 % (ref 12.0–46.0)
Lymphs Abs: 2.2 10*3/uL (ref 0.7–4.0)
MCHC: 33.2 g/dL (ref 30.0–36.0)
MCV: 81.4 fl (ref 78.0–100.0)
Monocytes Absolute: 1.4 10*3/uL — ABNORMAL HIGH (ref 0.1–1.0)
Monocytes Relative: 12.6 % — ABNORMAL HIGH (ref 3.0–12.0)
Neutro Abs: 7.2 10*3/uL (ref 1.4–7.7)
Neutrophils Relative %: 65.6 % (ref 43.0–77.0)
Platelets: 550 10*3/uL — ABNORMAL HIGH (ref 150.0–400.0)
RBC: 4.52 Mil/uL (ref 4.22–5.81)
RDW: 14.9 % (ref 11.5–15.5)
WBC: 10.9 10*3/uL — ABNORMAL HIGH (ref 4.0–10.5)

## 2021-12-10 LAB — COMPREHENSIVE METABOLIC PANEL
ALT: 11 U/L (ref 0–53)
AST: 15 U/L (ref 0–37)
Albumin: 3.2 g/dL — ABNORMAL LOW (ref 3.5–5.2)
Alkaline Phosphatase: 59 U/L (ref 39–117)
BUN: 4 mg/dL — ABNORMAL LOW (ref 6–23)
CO2: 29 mEq/L (ref 19–32)
Calcium: 8.6 mg/dL (ref 8.4–10.5)
Chloride: 103 mEq/L (ref 96–112)
Creatinine, Ser: 0.98 mg/dL (ref 0.40–1.50)
GFR: 89.19 mL/min (ref >60.00)
Glucose, Bld: 76 mg/dL (ref 70–99)
Potassium: 3.3 mEq/L — ABNORMAL LOW (ref 3.5–5.1)
Sodium: 140 mEq/L (ref 135–145)
Total Bilirubin: 0.3 mg/dL (ref 0.2–1.2)
Total Protein: 6.7 g/dL (ref 6.0–8.3)

## 2021-12-10 LAB — MAGNESIUM: Magnesium: 1.9 mg/dL (ref 1.5–2.5)

## 2021-12-10 LAB — C-REACTIVE PROTEIN: CRP: 5.2 mg/dL (ref 0.5–20.0)

## 2021-12-10 LAB — SEDIMENTATION RATE: Sed Rate: 22 mm/hr — ABNORMAL HIGH (ref 0–20)

## 2021-12-10 MED ORDER — POTASSIUM CHLORIDE 20 MEQ/15ML (10%) PO SOLN: 20.0000 meq | Freq: Every day | ORAL | 0 refills | Status: DC

## 2021-12-10 MED ORDER — IOHEXOL 300 MG/ML  SOLN
100.0000 mL | Freq: Once | INTRAMUSCULAR | Status: AC | PRN
  Administered 2021-12-10: 100 mL via INTRAVENOUS

## 2021-12-10 NOTE — Addendum Note (Signed)
Addended by: Leeanne Rio on: 12/10/2021 08:22 AM   Modules accepted: Orders

## 2021-12-10 NOTE — Addendum Note (Signed)
Addended by: Leeanne Rio on: 12/10/2021 08:21 AM   Modules accepted: Orders

## 2021-12-10 NOTE — Assessment & Plan Note (Signed)
Secondary to GI losses.  Mg is normal.

## 2021-12-10 NOTE — Progress Notes (Signed)
Hypokalemia addressed

## 2021-12-10 NOTE — Addendum Note (Signed)
Addended by: Crecencio Mc on: 12/10/2021 10:21 PM   Modules accepted: Orders

## 2021-12-10 NOTE — Addendum Note (Signed)
Addended by: Crecencio Mc on: 12/10/2021 04:23 PM   Modules accepted: Orders

## 2021-12-11 ENCOUNTER — Telehealth: Payer: Self-pay | Admitting: Internal Medicine

## 2021-12-11 NOTE — Telephone Encounter (Signed)
Pt called wanting to know his ct scan results

## 2021-12-11 NOTE — Telephone Encounter (Signed)
See result note message 

## 2021-12-14 DIAGNOSIS — R9389 Abnormal findings on diagnostic imaging of other specified body structures: Secondary | ICD-10-CM | POA: Diagnosis not present

## 2021-12-14 DIAGNOSIS — K2270 Barrett's esophagus without dysplasia: Secondary | ICD-10-CM | POA: Diagnosis not present

## 2021-12-14 DIAGNOSIS — K523 Indeterminate colitis: Secondary | ICD-10-CM | POA: Diagnosis not present

## 2021-12-14 LAB — STOOL CULTURE REFLEX - CMPCXR

## 2021-12-14 LAB — STOOL CULTURE REFLEX - RSASHR

## 2021-12-14 LAB — GIARDIA, EIA; OVA/PARASITE: Giardia Ag, Stl: NEGATIVE

## 2021-12-14 LAB — STOOL CULTURE: E coli, Shiga toxin Assay: NEGATIVE

## 2021-12-14 LAB — O&P RESULT

## 2021-12-15 ENCOUNTER — Encounter: Payer: Self-pay | Admitting: General Surgery

## 2021-12-15 ENCOUNTER — Other Ambulatory Visit: Payer: Self-pay | Admitting: General Surgery

## 2021-12-15 NOTE — Progress Notes (Signed)
Progress Notes - documented in this encounter Trystyn Dolley, Geronimo Boot, MD - 12/14/2021 4:00 PM EDT Formatting of this note is different from the original. Subjective:   Patient ID: Bruce Graham is a 51 y.o. male.  HPI  The following portions of the patient's history were reviewed and updated as appropriate.  This an established patient is here today for: office visit. The patient has been referred by Dr. Derrel Nip for evaluation of a persistent diarrhea and consideration for endoscopy. Patient had a recent CT scan done on 12-10-21 at [facility]. Patient reports on or around 11-30-21 watery uncontrollable diarrhea accompanied by a tiny amount bright red blood per rectum. He does report a significant amount of mucus with the stools. Volume of stools has decreased markedly since course of his antibiotics. 12 hours prior to the onset of his symptoms he had eaten a lunch of fajitas including beef, chicken and shrimp. He did not report whether any other individuals who ate lunch with him were sick.   He has finished his one week course of Flagyl and Cipro. Patient reports his bowel movements are still watery and he is going about 8-10 times per day at present. C. Diff test was negative.   Patient also has a history of Barrett's esophagus and with his last study done in 2019, he is a candidate for repeat upper endoscopy.  Chief Complaint  Patient presents with  Pre-op Exam    BP (!) 144/76  Pulse 71  Temp 36.5 C (97.7 F)  Ht 177.8 cm (_0 )  Wt 100.2 kg (221 lb)  SpO2 96%  BMI 31.71 kg/m   December 25, 2020 vital signs:  BP 132/80  Pulse 80  Temp 37.1 C (98.7 F)  Ht 177.8 cm (_1 )  Wt (!) 105.7 kg (233 lb)  SpO2 97%  BMI 33.43 kg/m   Past Medical History:  Diagnosis Date  Abscess of right thigh 06/16/2015  Barrett's esophagus determined by biopsy 03/06/2018  Short segment by report.  Bronchitis  Ear infection 12/13/2017  GERD (gastroesophageal reflux disease)  Suppurative  hidradenitis 04/24/2535  Umbilical hernia 64/40/3474    Past Surgical History:  Procedure Laterality Date  incision and drainage perirectal abscess 25/95/6387  UMBILICAL HERNIA REPAIR 56/43/3295  with mesh  EGD 03/06/2018  COLONOSCOPY 03/06/2018  incision and drainage gluteal abscess, pilonidal cyst 05/02/2020  excision right buttocks abscess Right 11/13/2020  11-20-20  incision and drainage gluteal abscess 11/20/2020  knee surgery Left    Social History   Socioeconomic History  Marital status: Single  Tobacco Use  Smoking status: Former  Packs/day: 0.50  Years: 30.00  Pack years: 15.00  Types: Cigarettes  Smokeless tobacco: Never  Substance and Sexual Activity  Alcohol use: Not Currently  Drug use: No    No Known Allergies  Current Outpatient Medications  Medication Sig Dispense Refill  calcium carbonate (TUMS) 200 mg calcium (500 mg) chewable tablet Take by mouth as needed  polyethylene glycol (MIRALAX) powder One bottle for colonoscopy prep. Use as directed. 255 g 0   No current facility-administered medications for this visit.   Family History  Problem Relation Age of Onset  No Known Problems Mother  Heart disease Father 44  Lung cancer Father 54  Bone cancer Paternal Grandfather 102   Labs and Radiology:   December 10, 2021 stool for ova and parasite, Salmonella Shigella, Campylobacter, E. coli: Negative.  December 04, 2021 stool sample negative for C. difficile.  December 10, 2021 laboratory:  WBC 4.0 -  10.5 K/uL 10.9 High  RBC 4.22 - 5.81 Mil/uL 4.52  Hemoglobin 13.0 - 17.0 g/dL 12.2 Low  HCT 39.0 - 52.0 % 36.8 Low  MCV 78.0 - 100.0 fl 81.4  MCHC 30.0 - 36.0 g/dL 33.2  RDW 11.5 - 15.5 % 14.9  Platelets 150.0 - 400.0 K/uL 550.0 High  Neutrophils Relative % 43.0 - 77.0 % 65.6  Lymphocytes Relative 12.0 - 46.0 % 19.9  Monocytes Relative 3.0 - 12.0 % 12.6 High  Eosinophils Relative 0.0 - 5.0 % 1.5  Basophils Relative 0.0 - 3.0 % 0.4  Neutro Abs 1.4 - 7.7  K/uL 7.2  Lymphs Abs 0.7 - 4.0 K/uL 2.2  Monocytes Absolute 0.1 - 1.0 K/uL 1.4 High  Eosinophils Absolute 0.0 - 0.7 K/uL 0.2  Basophils Absolute 0.0 - 0.1 K/uL 0.0   Sodium 135 - 145 mEq/L 140  Potassium 3.5 - 5.1 mEq/L 3.3 Low  Chloride 96 - 112 mEq/L 103  CO2 19 - 32 mEq/L 29  Glucose, Bld 70 - 99 mg/dL 76  BUN 6 - 23 mg/dL 4 Low  Creatinine, Ser 0.40 - 1.50 mg/dL 0.98  Total Bilirubin 0.2 - 1.2 mg/dL 0.3  Alkaline Phosphatase 39 - 117 U/L 59  AST 0 - 37 U/L 15  ALT 0 - 53 U/L 11  Total Protein 6.0 - 8.3 g/dL 6.7  Albumin 3.5 - 5.2 g/dL 3.2 Low  GFR >60.00 mL/min 89.19  Comment: Calculated using the CKD-EPI Creatinine Equation (2021) Calcium 8.4 - 10.5 mg/dL 8.6   Sed Rate 0 - 20 mm/hr 22 High   CRP 0.5 - 20.0 mg/dL 5.2   December 10, 2021 CT of the abdomen and pelvis: Wall thickening of the ascending colon through the mid transverse colon, some of which is low-density. Mild pericolonic edema. There is diverticulosis from the splenic flexure distally, prominently affecting the sigmoid. Suggestion of mild irregular wall thickening in the sigmoid colon. No discrete diverticulitis. No bowel pneumatosis.  Vascular/Lymphatic: There are increased number of lymph nodes in the sigmoid mesentery measuring up to 8 mm, series 2, image 67. Minimal aortic atherosclerosis. Distal aortic aneurysm just above the bifurcation at 3 cm. Patent portal, splenic, and superior mesenteric veins. Shoddy 11 mm left inguinal node. There a few small nonenlarged pelvic sidewall lymph nodes. 6. Possible gallstone or wall calcification in the dependent gallbladder without CT findings of acute cholecystitis. . This study was independently reviewed.  Focal soft tissue thickening in the left gluteal tissue as well as bandlike thickening to the left of the midline in the area of his prior abscess. No evidence of intrapelvic extension.  GI pathology March 06, 2018:  DIAGNOSIS:  A. ESOPHAGUS, 37 CM;  COLD BIOPSY:  - BARRETT'S ESOPHAGUS.  - MILD CHRONIC ACTIVE INFLAMMATION.  - NEGATIVE FOR DYSPLASIA AND MALIGNANCY.   B. ESOPHAGUS, 35 CM; COLD BIOPSY:  - BARRETT'S ESOPHAGUS.  - MODERATE CHRONIC ACTIVE INFLAMMATION.  - NEGATIVE FOR DYSPLASIA AND MALIGNANCY.   C. SIGMOID COLON, SEVERE ERYTHEMA; COLD BIOPSY:  - CRYPT HYPERPLASIA, SMOOTH MUSCLE HYPERPLASIA, CONGESTION, HEMOSIDERIN  DEPOSITION, AND CHRONIC INFLAMMATION, SUGGESTIVE OF MUCOSAL PROLAPSE OR  INJURY.  - NEGATIVE FOR DYSPLASIA AND MALIGNANCY.   Colonoscopy report: Screening exam for a patient with distant relatives with colon cancer first-degree relatives with polyps colon diverticulosis. Extensive diverticulosis sigmoid and rectosigmoid. Single diverticulum of the transverse colon and ascending colon.  Upper endoscopy report: Study completed for GERD and suspected Barrett's. Small hiatal hernia, short segment Barrett's (confirmed on biopsy).  Review  of Systems  Constitutional: Negative for chills and fever.  Respiratory: Negative for cough.    Objective:  Physical Exam Constitutional:  Appearance: Normal appearance.  Cardiovascular:  Rate and Rhythm: Normal rate and regular rhythm.  Pulses: Normal pulses.  Heart sounds: Normal heart sounds.  Pulmonary:  Effort: Pulmonary effort is normal.  Breath sounds: Normal breath sounds.  Musculoskeletal:  Cervical back: Neck supple.  Neurological:  Mental Status: He is alert and oriented to person, place, and time.  Psychiatric:  Mood and Affect: Mood normal.  Behavior: Behavior normal.    Assessment:   Prominent diarrhea without evidence of enteric pathogen. Mild-modest improvement after empiric course of antibiotics.  Past history documented Barrett's esophageal changes.  Ongoing smoking.  Weight loss secondary to pronounced diarrhea.  Plan:   The CT study involving the right colon would suggest possibility of ischemic colitis, with patent mesenteric vessels.  The changes on the left are very diffuse for diverticulitis as sole source. Colonoscopy is indicated. The patient declined to return to the physician who completed his original endoscopy in 2019 reporting severe pain during attempted hemorrhoid banding in office.  With the past findings of Barrett's epithelial change and ongoing smoking, repeat biopsy is appropriate.  He was instructed in regards to preparation for the study by the staff. Upper and lower endoscopy planned for December 16, 2021.  Over 60 minutes was spent during chart review and clinical exam.  This note is partially prepared by Ledell Noss, CMA acting as a scribe in the presence of Dr. Hervey Ard, MD.   The documentation recorded by the scribe accurately reflects the service I personally performed and the decisions made by me.   Robert Bellow, MD FACS  Electronically signed by Mayer Masker, MD at 12/15/2021 7:23 AM EDT

## 2021-12-16 ENCOUNTER — Ambulatory Visit: Payer: BC Managed Care – PPO

## 2021-12-16 ENCOUNTER — Encounter
Admission: RE | Disposition: A | Discharge status: Home / Self Care | Payer: Self-pay | Source: Home / Self Care | Attending: General Surgery

## 2021-12-16 ENCOUNTER — Encounter: Payer: Self-pay | Admitting: General Surgery

## 2021-12-16 ENCOUNTER — Ambulatory Visit
Admission: RE | Admit: 2021-12-16 | Discharge: 2021-12-16 | Disposition: A | Discharge status: Home / Self Care | Payer: BC Managed Care – PPO | Attending: General Surgery | Admitting: General Surgery

## 2021-12-16 DIAGNOSIS — Z8719 Personal history of other diseases of the digestive system: Secondary | ICD-10-CM | POA: Diagnosis not present

## 2021-12-16 DIAGNOSIS — K5190 Ulcerative colitis, unspecified, without complications: Secondary | ICD-10-CM | POA: Diagnosis not present

## 2021-12-16 DIAGNOSIS — C159 Malignant neoplasm of esophagus, unspecified: Secondary | ICD-10-CM | POA: Diagnosis not present

## 2021-12-16 DIAGNOSIS — K529 Noninfective gastroenteritis and colitis, unspecified: Secondary | ICD-10-CM | POA: Diagnosis not present

## 2021-12-16 DIAGNOSIS — K22711 Barrett's esophagus with high grade dysplasia: Secondary | ICD-10-CM | POA: Diagnosis not present

## 2021-12-16 DIAGNOSIS — K5730 Diverticulosis of large intestine without perforation or abscess without bleeding: Secondary | ICD-10-CM | POA: Diagnosis not present

## 2021-12-16 DIAGNOSIS — K2100 Gastro-esophageal reflux disease with esophagitis, without bleeding: Secondary | ICD-10-CM | POA: Insufficient documentation

## 2021-12-16 DIAGNOSIS — K6289 Other specified diseases of anus and rectum: Secondary | ICD-10-CM | POA: Insufficient documentation

## 2021-12-16 DIAGNOSIS — Z79899 Other long term (current) drug therapy: Secondary | ICD-10-CM | POA: Insufficient documentation

## 2021-12-16 DIAGNOSIS — K5120 Ulcerative (chronic) proctitis without complications: Secondary | ICD-10-CM | POA: Diagnosis not present

## 2021-12-16 DIAGNOSIS — C155 Malignant neoplasm of lower third of esophagus: Secondary | ICD-10-CM | POA: Diagnosis not present

## 2021-12-16 DIAGNOSIS — K449 Diaphragmatic hernia without obstruction or gangrene: Secondary | ICD-10-CM | POA: Insufficient documentation

## 2021-12-16 DIAGNOSIS — E785 Hyperlipidemia, unspecified: Secondary | ICD-10-CM | POA: Diagnosis not present

## 2021-12-16 DIAGNOSIS — K6389 Other specified diseases of intestine: Secondary | ICD-10-CM | POA: Insufficient documentation

## 2021-12-16 DIAGNOSIS — K22719 Barrett's esophagus with dysplasia, unspecified: Secondary | ICD-10-CM | POA: Diagnosis not present

## 2021-12-16 DIAGNOSIS — I2510 Atherosclerotic heart disease of native coronary artery without angina pectoris: Secondary | ICD-10-CM | POA: Diagnosis not present

## 2021-12-16 DIAGNOSIS — K2270 Barrett's esophagus without dysplasia: Secondary | ICD-10-CM | POA: Diagnosis not present

## 2021-12-16 DIAGNOSIS — I10 Essential (primary) hypertension: Secondary | ICD-10-CM | POA: Insufficient documentation

## 2021-12-16 DIAGNOSIS — R197 Diarrhea, unspecified: Secondary | ICD-10-CM | POA: Diagnosis not present

## 2021-12-16 DIAGNOSIS — F17210 Nicotine dependence, cigarettes, uncomplicated: Secondary | ICD-10-CM | POA: Insufficient documentation

## 2021-12-16 DIAGNOSIS — K5100 Ulcerative (chronic) pancolitis without complications: Secondary | ICD-10-CM

## 2021-12-16 HISTORY — DX: Essential (primary) hypertension: I10

## 2021-12-16 HISTORY — DX: Myalgia, unspecified site: M79.10

## 2021-12-16 HISTORY — DX: Unspecified thoracic, thoracolumbar and lumbosacral intervertebral disc disorder: M51.9

## 2021-12-16 HISTORY — DX: Hyperlipidemia, unspecified: E78.5

## 2021-12-16 SURGERY — ESOPHAGOGASTRODUODENOSCOPY (EGD) WITH PROPOFOL
Anesthesia: General

## 2021-12-16 MED ORDER — LIDOCAINE HCL (CARDIAC) PF 100 MG/5ML IV SOSY
PREFILLED_SYRINGE | INTRAVENOUS | Status: DC | PRN
  Administered 2021-12-16: 50 mg via INTRAVENOUS

## 2021-12-16 MED ORDER — PREDNISONE 10 MG PO TABS: ORAL_TABLET | ORAL | 0 refills | Status: DC

## 2021-12-16 MED ORDER — PROPOFOL 500 MG/50ML IV EMUL
INTRAVENOUS | Status: DC | PRN
  Administered 2021-12-16: 150 ug/kg/min via INTRAVENOUS

## 2021-12-16 MED ORDER — SODIUM CHLORIDE 0.9 % IV SOLN
INTRAVENOUS | Status: DC
  Administered 2021-12-16: 1000 mL via INTRAVENOUS

## 2021-12-16 MED ORDER — GLYCOPYRROLATE 0.2 MG/ML IJ SOLN
INTRAMUSCULAR | Status: DC | PRN
  Administered 2021-12-16: .2 mg via INTRAVENOUS

## 2021-12-16 MED ORDER — MIDAZOLAM HCL 2 MG/2ML IJ SOLN
INTRAMUSCULAR | Status: AC
  Filled 2021-12-16: qty 2

## 2021-12-16 MED ORDER — PROPOFOL 10 MG/ML IV BOLUS
INTRAVENOUS | Status: DC | PRN
  Administered 2021-12-16: 60 mg via INTRAVENOUS
  Administered 2021-12-16: 40 mg via INTRAVENOUS

## 2021-12-16 MED ORDER — MIDAZOLAM HCL 2 MG/2ML IJ SOLN
INTRAMUSCULAR | Status: DC | PRN
  Administered 2021-12-16: 2 mg via INTRAVENOUS

## 2021-12-16 NOTE — Anesthesia Preprocedure Evaluation (Signed)
Anesthesia Evaluation  Patient identified by MRN, date of birth, ID band Patient awake    Reviewed: Allergy & Precautions, H&P , NPO status , Patient's Chart, lab work & pertinent test results, reviewed documented beta blocker date and time   History of Anesthesia Complications (+) Family history of anesthesia reaction and history of anesthetic complications  Airway Mallampati: II   Neck ROM: full    Dental  (+) Poor Dentition   Pulmonary neg pulmonary ROS, Current Smoker,    Pulmonary exam normal        Cardiovascular Exercise Tolerance: Good hypertension, On Medications + CAD  Normal cardiovascular exam Rhythm:regular Rate:Normal     Neuro/Psych negative neurological ROS  negative psych ROS   GI/Hepatic Neg liver ROS, GERD  Medicated,  Endo/Other  negative endocrine ROS  Renal/GU negative Renal ROS  negative genitourinary   Musculoskeletal   Abdominal   Peds  Hematology negative hematology ROS (+)   Anesthesia Other Findings Past Medical History: 06/16/2015: Abscess of right thigh No date: Barrett esophagus No date: Bronchitis No date: Complication of anesthesia     Comment:  hard to wake up after hernia surgery 12/13/2017: Ear infection     Comment:  taking amoxicillin No date: Family history of adverse reaction to anesthesia     Comment:  mom hard to wake up  No date: GERD (gastroesophageal reflux disease) No date: HLD (hyperlipidemia) No date: Hydradenitis     Comment:  Left leg No date: Hypertension No date: Intervertebral disc disorder No date: Myalgia 08/07/2015: Suppurative hidradenitis No date: Suppurative hidradenitis 36/64/4034: Umbilical hernia without obstruction and without gangrene Past Surgical History: 03/06/2018: COLONOSCOPY WITH PROPOFOL; N/A     Comment:  Procedure: COLONOSCOPY WITH PROPOFOL;  Surgeon: Lin Landsman, MD;  Location: [facility] ENDOSCOPY;  Service:                Gastroenterology;  Laterality: N/A; 03/06/2018: ESOPHAGOGASTRODUODENOSCOPY (EGD) WITH PROPOFOL; N/A     Comment:  Procedure: ESOPHAGOGASTRODUODENOSCOPY (EGD) WITH               PROPOFOL;  Surgeon: Lin Landsman, MD;  Location:               [facility];  Service: Gastroenterology;  Laterality:               N/A; No date: HERNIA REPAIR 05/02/2020: INCISION AND DRAINAGE ABSCESS; N/A     Comment:  Procedure: INCISION AND DRAINAGE ABSCESS;  Surgeon:               Robert Bellow, MD;  Location: [place] [facility];  Service:               General;  Laterality: N/A; 06/16/2015: INCISION AND DRAINAGE PERIRECTAL ABSCESS; Right     Comment:  Procedure: IRRIGATION AND DEBRIDEMENT right inner thigh               ABSCESS;  Surgeon: Florene Glen, MD;  Location: [facility]               ORS;  Service: General;  Laterality: Right; 12/20/2017: INSERTION OF MESH; N/A     Comment:  Procedure: INSERTION OF MESH;  Surgeon: [name], MD;  Location: [facility] ORS;  Service: General;  Laterality: N/A; 2002: KNEE ARTHROSCOPY WITH ANTERIOR CRUCIATE LIGAMENT (ACL) REPAIR;  Left No date: KNEE SURGERY; Left 05/02/2020: PILONIDAL CYST EXCISION; N/A     Comment:  Procedure: CYST EXCISION PILONIDAL EXTENSIVE;  Surgeon:               Robert Bellow, MD;  Location: [facility] ORS;  Service:               General;  Laterality: N/A; 60/15/6153: UMBILICAL HERNIA REPAIR; N/A     Comment:  Procedure: HERNIA REPAIR UMBILICAL ADULT;  Surgeon:               [name], MD;  Location: [facility] ORS;  Service:               General;  Laterality: N/A;   Reproductive/Obstetrics negative OB ROS                             Anesthesia Physical Anesthesia Plan  ASA: 3  Anesthesia Plan: General   Post-op Pain Management:    Induction:   PONV Risk Score and Plan:   Airway Management Planned:   Additional Equipment:   Intra-op Plan:    Post-operative Plan:   Informed Consent: I have reviewed the patients History and Physical, chart, labs and discussed the procedure including the risks, benefits and alternatives for the proposed anesthesia with the patient or authorized representative who has indicated his/her understanding and acceptance.     Dental Advisory Given  Plan Discussed with: CRNA  Anesthesia Plan Comments:         Anesthesia Quick Evaluation

## 2021-12-16 NOTE — Transfer of Care (Signed)
Immediate Anesthesia Transfer of Care Note  Patient: Bruce Graham  Procedure(s) Performed: ESOPHAGOGASTRODUODENOSCOPY (EGD) WITH PROPOFOL COLONOSCOPY WITH PROPOFOL  Patient Location: PACU and Endoscopy Unit  Anesthesia Type:MAC  Level of Consciousness: drowsy  Airway & Oxygen Therapy: Patient Spontanous Breathing and Patient connected to nasal cannula oxygen  Post-op Assessment: Report given to RN and Post -op Vital signs reviewed and stable  Post vital signs: Reviewed and stable  Last Vitals:  Vitals Value Taken Time  BP 99/75 12/16/21 1105  Temp    Pulse 83 12/16/21 1103  Resp 20 12/16/21 1105  SpO2 89 % 12/16/21 1103  Vitals shown include unvalidated device data.  Last Pain:  Vitals:   12/16/21 1100  TempSrc:   PainSc: 0-No pain         Complications: No notable events documented.

## 2021-12-16 NOTE — Op Note (Signed)
[facility] Gastroenterology Patient Name: Bruce Graham Procedure Date: 12/16/2021 10:09 AM MRN: [id number] Account #: [id number] Date of Birth: [birth year] Admit Type: Outpatient Age: 51 Room: [facility] ENDO ROOM 1 Gender: Male Note Status: Finalized Instrument Name: Upper Endoscope [id number] Procedure:             Upper GI endoscopy Indications:           Follow-up of Barrett's esophagus Providers:             Robert Bellow, MD Referring MD:          Deborra Medina, MD (Referring MD) Medicines:             Propofol per Anesthesia Complications:         No immediate complications. Procedure:             Pre-Anesthesia Assessment:                        - Prior to the procedure, a History and Physical was                         performed, and patient medications, allergies and                         sensitivities were reviewed. The patient's tolerance                         of previous anesthesia was reviewed.                        - The risks and benefits of the procedure and the                         sedation options and risks were discussed with the                         patient. All questions were answered and informed                         consent was obtained.                        After obtaining informed consent, the endoscope was                         passed under direct vision. Throughout the procedure,                         the patient's blood pressure, pulse, and oxygen                         saturations were monitored continuously. The Endoscope                         was introduced through the mouth, and advanced to the                         second part of duodenum. The upper GI endoscopy was  accomplished without difficulty. The patient tolerated                         the procedure well. Findings:      LA Grade C (one or more mucosal breaks continuous between tops of 2 or       more mucosal folds, less than  75% circumference) esophagitis with no       bleeding was found 36 to 41 cm from the incisors. Mucosa was biopsied       with a cold forceps for histology in 4 quadrants at intervals of 2 cm at       36, 38 and 40 cm from the incisors. A total of 3 specimen bottles were       sent to pathology.      A small hiatal hernia was present.      The examined duodenum was normal. Impression:            - LA Grade C reflux esophagitis with no bleeding. Rule                         out Barrett's esophagus. Biopsied.                        - Small hiatal hernia.                        - Normal examined duodenum. Recommendation:        - Telephone endoscopist for pathology results in 1                         week. Procedure Code(s):     --- Professional ---                        [phone], Esophagogastroduodenoscopy, flexible,                         transoral; with biopsy, single or multiple Diagnosis Code(s):     --- Professional ---                        K21.00, Gastro-esophageal reflux disease with                         esophagitis, without bleeding                        K44.9, Diaphragmatic hernia without obstruction or                         gangrene                        K22.70, Barrett's esophagus without dysplasia CPT copyright 2019 American Medical Association. All rights reserved. The codes documented in this report are preliminary and upon coder review may  be revised to meet current compliance requirements. Robert Bellow, MD 12/16/2021 10:32:39 AM This report has been signed electronically. Number of Addenda: 0 Note Initiated On: 12/16/2021 10:09 AM Estimated Blood Loss:  Estimated blood loss: none. Estimated blood loss was  minimal.      [facility]

## 2021-12-16 NOTE — Anesthesia Postprocedure Evaluation (Signed)
Anesthesia Post Note  Patient: Seldon Barrell  Procedure(s) Performed: ESOPHAGOGASTRODUODENOSCOPY (EGD) WITH PROPOFOL COLONOSCOPY WITH PROPOFOL  Patient location during evaluation: PACU Anesthesia Type: General Level of consciousness: awake and alert Pain management: pain level controlled Vital Signs Assessment: post-procedure vital signs reviewed and stable Respiratory status: spontaneous breathing, nonlabored ventilation, respiratory function stable and patient connected to nasal cannula oxygen Cardiovascular status: blood pressure returned to baseline and stable Postop Assessment: no apparent nausea or vomiting Anesthetic complications: no   No notable events documented.   Last Vitals:  Vitals:   12/16/21 0830  BP: 119/84  Pulse: 76  Resp: 18  Temp: (!) 35.9 C  SpO2: 99%    Last Pain:  Vitals:   12/16/21 1100  TempSrc:   PainSc: 0-No pain                 Molli Barrows

## 2021-12-16 NOTE — H&P (Signed)
Bruce Graham [id number] [birth year]     HPI: Weeks long history of diarrhea with negative stool testing for enteric pathogens and  C diff.  Hx Barrett's epithelial changes on EGD in 2019.  For upper and lower endoscopy. Tolerated prep well.   Medications Prior to Admission  Medication Sig Dispense Refill Last Dose   aspirin EC 81 MG tablet Take 81 mg by mouth daily. Swallow whole.   Past Week   baclofen (LIORESAL) 10 MG tablet Take 1 tablet (10 mg total) by mouth 2 (two) times daily. 20 each 0 Past Week   ezetimibe (ZETIA) 10 MG tablet Take 1 tablet (10 mg total) by mouth daily. 30 tablet 2 Past Week   gabapentin (NEURONTIN) 300 MG capsule Take by mouth.   Past Week   omeprazole (PRILOSEC) 40 MG capsule Take 1 capsule (40 mg total) by mouth daily. 90 capsule 1 Past Week   loperamide (IMODIUM) 2 MG capsule Take 4 mg by mouth as needed for diarrhea or loose stools (OVC).    at prn   metoCLOPramide (REGLAN) 10 MG tablet Take 1 tablet (10 mg total) by mouth every 6 (six) hours as needed (hiccups). (Patient not taking: Reported on 12/16/2021) 20 tablet 0 Not Taking   potassium chloride 20 MEQ/15ML (10%) SOLN Take 15 mLs (20 mEq total) by mouth daily for 4 days. 60 mL 0    No Known Allergies Past Medical History:  Diagnosis Date   Abscess of right thigh 06/16/2015   Barrett esophagus    Bronchitis    Complication of anesthesia    hard to wake up after hernia surgery   Ear infection 12/13/2017   taking amoxicillin   Family history of adverse reaction to anesthesia    mom hard to wake up    GERD (gastroesophageal reflux disease)    HLD (hyperlipidemia)    Hydradenitis    Left leg   Hypertension    Intervertebral disc disorder    Myalgia    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/07/2015   Suppurative hidradenitis    Umbilical hernia without obstruction and without gangrene 11/22/2017   Past Surgical History:  Procedure Laterality Date   COLONOSCOPY WITH PROPOFOL N/A 03/06/2018   Procedure:  COLONOSCOPY WITH PROPOFOL;  Surgeon: Lin Landsman, MD;  Location: [facility] END[facility];  Service: Gastroenterology;  Laterality: N/A;   ESOPHAGOGASTRODUODENOSCOPY (EGD) WITH PROPOFOL N/A 03/06/2018   Procedure: ESOPHAGOGASTRODUODENOSCOPY (EGD) WITH PROPOFOL;  Surgeon: Lin Landsman, MD;  Location: [facility] ENDOSCOPY;  Service: Gastroenterology;  Laterality: N/A;   HERNIA REPAIR     INCISION AND DRAINAGE ABSCESS N/A 05/02/2020   Procedure: INCISION AND DRAINAGE ABSCESS;  Surgeon: Robert Bellow, MD;  Location: [facility] ORS;  Service: General;  Laterality: N/A;   INCISION AND DRAINAGE PERIRECTAL ABSCESS Right 06/16/2015   Procedure: IRRIGATION AND DEBRIDEMENT right inner thigh ABSCESS;  Surgeon: Florene Glen, MD;  Location: [facility];  Service: General;  Laterality: Right;   INSERTION OF MESH N/A 12/20/2017   Procedure: INSERTION OF MESH;  Surgeon: [name], MD;  Location: [facility] ORS;  Service: General;  Laterality: N/A;   KNEE ARTHROSCOPY WITH ANTERIOR CRUCIATE LIGAMENT (ACL) REPAIR Left 2002   KNEE SURGERY Left    PILONIDAL CYST EXCISION N/A 05/02/2020   Procedure: CYST EXCISION PILONIDAL EXTENSIVE;  Surgeon: Robert Bellow, MD;  Location: [facility];  Service: General;  Laterality: N/A;   UMBILICAL HERNIA REPAIR N/A 12/20/2017   Procedure: HERNIA REPAIR UMBILICAL ADULT;  Surgeon: [name], MD;  Location: [facility] ORS;  Service: General;  Laterality: N/A;   Social History   Socioeconomic History   Marital status: Single    Spouse name: Not on file   Number of children: Not on file   Years of education: Not on file   Highest education level: Not on file  Occupational History   Not on file  Tobacco Use   Smoking status: Every Day    Packs/day: 0.50    Years: 30.00    Total pack years: 15.00    Types: Cigarettes   Smokeless tobacco: Never  Vaping Use   Vaping Use: Never used  Substance and Sexual Activity   Alcohol use: Not Currently    Comment: social   Drug  use: No   Sexual activity: Not Currently  Other Topics Concern   Not on file  Social History Narrative   Not on file   Social Determinants of Health   Financial Resource Strain: Not on file  Food Insecurity: Not on file  Transportation Needs: Not on file  Physical Activity: Not on file  Stress: Not on file  Social Connections: Not on file  Intimate Partner Violence: Not on file   Social History   Social History Narrative   Not on file     ROS: Negative.     PE: HEENT: Negative. Lungs: Clear. Cardio: RRForest Gleason Gervis Graham 12/16/2021   Assessment/Plan:  Proceed with planned upper and lower endoscopy.

## 2021-12-16 NOTE — Op Note (Addendum)
[facility] Gastroenterology Patient Name: Carley Strickling Procedure Date: 12/16/2021 10:00 AM MRN: [id number] Account #: [id number] Date of Birth: 20-Jan-1971 Admit Type: Outpatient Age: 51 Room: [facility] ENDO ROOM 1 Gender: Male Note Status: Finalized Instrument Name: Peds Colonoscope [id number] Procedure:             Colonoscopy Indications:           Clinically significant diarrhea of unexplained origin Providers:             Robert Bellow, MD Referring MD:          Deborra Medina, MD (Referring MD) Medicines:             Propofol per Anesthesia Complications:         No immediate complications. Procedure:             Pre-Anesthesia Assessment:                        - Prior to the procedure, a History and Physical was                         performed, and patient medications, allergies and                         sensitivities were reviewed. The patient's tolerance                         of previous anesthesia was reviewed.                        - The risks and benefits of the procedure and the                         sedation options and risks were discussed with the                         patient. All questions were answered and informed                         consent was obtained.                        After obtaining informed consent, the colonoscope was                         passed under direct vision. Throughout the procedure,                         the patient's blood pressure, pulse, and oxygen                         saturations were monitored continuously. The                         Colonoscope was introduced through the anus and                         advanced to the the cecum, identified by appendiceal  orifice and ileocecal valve. The colonoscopy was                         performed without difficulty. The patient tolerated                         the procedure well. The quality of the bowel                          preparation was excellent. Findings:      Diffuse severe inflammation characterized by congestion (edema),       erythema and mucus was found in the entire colon. Biopsies were taken       with a cold forceps for histology rom the cecum, left colon and rectum. Impression:            - Diffuse severe inflammation was found in the entire                         examined colon secondary to pancolitis. Biopsied. Recommendation:        - Telephone endoscopist for pathology results in 1                         week. Procedure Code(s):     --- Professional ---                        [phone], Colonoscopy, flexible; with biopsy, single or                         multiple Diagnosis Code(s):     --- Professional ---                        K52.9, Noninfective gastroenteritis and colitis,                         unspecified                        R19.7, Diarrhea, unspecified CPT copyright 2019 American Medical Association. All rights reserved. The codes documented in this report are preliminary and upon coder review may  be revised to meet current compliance requirements. Robert Bellow, MD 12/16/2021 10:59:17 AM This report has been signed electronically. Number of Addenda: 0 Note Initiated On: 12/16/2021 10:00 AM Scope Withdrawal Time: 0 hours 15 minutes 37 seconds  Total Procedure Duration: 0 hours 20 minutes 47 seconds  Estimated Blood Loss:  Estimated blood loss was minimal.      [facility]

## 2021-12-17 ENCOUNTER — Other Ambulatory Visit: Payer: Self-pay | Admitting: Family Medicine

## 2021-12-17 ENCOUNTER — Other Ambulatory Visit: Payer: Self-pay | Admitting: Anatomic Pathology & Clinical Pathology

## 2021-12-17 ENCOUNTER — Encounter: Payer: Self-pay | Admitting: General Surgery

## 2021-12-17 DIAGNOSIS — K529 Noninfective gastroenteritis and colitis, unspecified: Secondary | ICD-10-CM | POA: Diagnosis not present

## 2021-12-17 DIAGNOSIS — C155 Malignant neoplasm of lower third of esophagus: Secondary | ICD-10-CM | POA: Diagnosis not present

## 2021-12-17 DIAGNOSIS — C159 Malignant neoplasm of esophagus, unspecified: Secondary | ICD-10-CM

## 2021-12-17 DIAGNOSIS — K5100 Ulcerative (chronic) pancolitis without complications: Secondary | ICD-10-CM | POA: Diagnosis not present

## 2021-12-17 DIAGNOSIS — D72829 Elevated white blood cell count, unspecified: Secondary | ICD-10-CM

## 2021-12-17 DIAGNOSIS — Z111 Encounter for screening for respiratory tuberculosis: Secondary | ICD-10-CM | POA: Diagnosis not present

## 2021-12-17 LAB — SURGICAL PATHOLOGY

## 2021-12-18 DIAGNOSIS — K529 Noninfective gastroenteritis and colitis, unspecified: Secondary | ICD-10-CM | POA: Diagnosis not present

## 2021-12-21 ENCOUNTER — Other Ambulatory Visit: Payer: Self-pay | Admitting: General Surgery

## 2021-12-21 DIAGNOSIS — C155 Malignant neoplasm of lower third of esophagus: Secondary | ICD-10-CM

## 2021-12-24 ENCOUNTER — Other Ambulatory Visit: Payer: BC Managed Care – PPO

## 2021-12-24 NOTE — Progress Notes (Signed)
Patient not discussed as he has chosen to get treatment at [facility]:       Group: Adeno carcinoma of Esophagus

## 2021-12-25 ENCOUNTER — Ambulatory Visit
Admission: RE | Admit: 2021-12-25 | Discharge: 2021-12-25 | Disposition: A | Discharge status: Home / Self Care | Payer: BC Managed Care – PPO | Source: Ambulatory Visit | Attending: General Surgery | Admitting: General Surgery

## 2021-12-25 DIAGNOSIS — C159 Malignant neoplasm of esophagus, unspecified: Secondary | ICD-10-CM | POA: Diagnosis not present

## 2021-12-25 DIAGNOSIS — C155 Malignant neoplasm of lower third of esophagus: Secondary | ICD-10-CM | POA: Diagnosis not present

## 2021-12-25 DIAGNOSIS — K529 Noninfective gastroenteritis and colitis, unspecified: Secondary | ICD-10-CM

## 2021-12-25 DIAGNOSIS — J439 Emphysema, unspecified: Secondary | ICD-10-CM | POA: Diagnosis not present

## 2021-12-25 MED ORDER — IOHEXOL 300 MG/ML  SOLN
75.0000 mL | Freq: Once | INTRAMUSCULAR | Status: AC | PRN
Start: 2021-12-25 — End: 2021-12-25
  Administered 2021-12-25: 75 mL via INTRAVENOUS

## 2021-12-31 NOTE — Assessment & Plan Note (Addendum)
Suggested by biopsies of colon and rectum, done June 2023  , with crypt abscess formation

## 2022-01-04 ENCOUNTER — Other Ambulatory Visit: Payer: BC Managed Care – PPO

## 2022-01-04 ENCOUNTER — Other Ambulatory Visit (INDEPENDENT_AMBULATORY_CARE_PROVIDER_SITE_OTHER): Payer: BC Managed Care – PPO

## 2022-01-04 DIAGNOSIS — R933 Abnormal findings on diagnostic imaging of other parts of digestive tract: Secondary | ICD-10-CM | POA: Diagnosis not present

## 2022-01-04 DIAGNOSIS — F17210 Nicotine dependence, cigarettes, uncomplicated: Secondary | ICD-10-CM | POA: Diagnosis not present

## 2022-01-04 DIAGNOSIS — K22711 Barrett's esophagus with high grade dysplasia: Secondary | ICD-10-CM | POA: Diagnosis not present

## 2022-01-04 DIAGNOSIS — K2289 Other specified disease of esophagus: Secondary | ICD-10-CM | POA: Diagnosis not present

## 2022-01-04 DIAGNOSIS — Z7952 Long term (current) use of systemic steroids: Secondary | ICD-10-CM | POA: Diagnosis not present

## 2022-01-04 DIAGNOSIS — K219 Gastro-esophageal reflux disease without esophagitis: Secondary | ICD-10-CM | POA: Diagnosis not present

## 2022-01-04 DIAGNOSIS — Z79899 Other long term (current) drug therapy: Secondary | ICD-10-CM | POA: Diagnosis not present

## 2022-01-04 DIAGNOSIS — K2210 Ulcer of esophagus without bleeding: Secondary | ICD-10-CM | POA: Diagnosis not present

## 2022-01-04 DIAGNOSIS — D72829 Elevated white blood cell count, unspecified: Secondary | ICD-10-CM | POA: Diagnosis not present

## 2022-01-04 DIAGNOSIS — B259 Cytomegaloviral disease, unspecified: Secondary | ICD-10-CM | POA: Diagnosis not present

## 2022-01-04 DIAGNOSIS — C155 Malignant neoplasm of lower third of esophagus: Secondary | ICD-10-CM | POA: Diagnosis not present

## 2022-01-04 DIAGNOSIS — C159 Malignant neoplasm of esophagus, unspecified: Secondary | ICD-10-CM | POA: Diagnosis not present

## 2022-01-04 LAB — CBC WITH DIFFERENTIAL/PLATELET
Basophils Absolute: 0.1 10*3/uL (ref 0.0–0.1)
Basophils Relative: 0.5 % (ref 0.0–3.0)
Eosinophils Absolute: 0.2 10*3/uL (ref 0.0–0.7)
Eosinophils Relative: 1.8 % (ref 0.0–5.0)
HCT: 38.2 % — ABNORMAL LOW (ref 39.0–52.0)
Hemoglobin: 12.2 g/dL — ABNORMAL LOW (ref 13.0–17.0)
Lymphocytes Relative: 20.5 % (ref 12.0–46.0)
Lymphs Abs: 2.7 10*3/uL (ref 0.7–4.0)
MCHC: 32 g/dL (ref 30.0–36.0)
MCV: 80.6 fl (ref 78.0–100.0)
Monocytes Absolute: 1.1 10*3/uL — ABNORMAL HIGH (ref 0.1–1.0)
Monocytes Relative: 8.1 % (ref 3.0–12.0)
Neutro Abs: 9 10*3/uL — ABNORMAL HIGH (ref 1.4–7.7)
Neutrophils Relative %: 69.1 % (ref 43.0–77.0)
Platelets: 348 10*3/uL (ref 150.0–400.0)
RBC: 4.74 Mil/uL (ref 4.22–5.81)
RDW: 15.8 % — ABNORMAL HIGH (ref 11.5–15.5)
WBC: 13 10*3/uL — ABNORMAL HIGH (ref 4.0–10.5)

## 2022-01-19 DIAGNOSIS — B258 Other cytomegaloviral diseases: Secondary | ICD-10-CM | POA: Diagnosis not present

## 2022-01-28 DIAGNOSIS — Z87891 Personal history of nicotine dependence: Secondary | ICD-10-CM | POA: Diagnosis not present

## 2022-01-28 DIAGNOSIS — Z8501 Personal history of malignant neoplasm of esophagus: Secondary | ICD-10-CM | POA: Diagnosis not present

## 2022-01-28 DIAGNOSIS — K2100 Gastro-esophageal reflux disease with esophagitis, without bleeding: Secondary | ICD-10-CM | POA: Diagnosis not present

## 2022-01-28 DIAGNOSIS — Z79899 Other long term (current) drug therapy: Secondary | ICD-10-CM | POA: Diagnosis not present

## 2022-01-28 DIAGNOSIS — J439 Emphysema, unspecified: Secondary | ICD-10-CM | POA: Diagnosis not present

## 2022-01-28 DIAGNOSIS — R0602 Shortness of breath: Secondary | ICD-10-CM | POA: Diagnosis not present

## 2022-01-28 DIAGNOSIS — Z08 Encounter for follow-up examination after completed treatment for malignant neoplasm: Secondary | ICD-10-CM | POA: Diagnosis not present

## 2022-01-28 DIAGNOSIS — C159 Malignant neoplasm of esophagus, unspecified: Secondary | ICD-10-CM | POA: Diagnosis not present

## 2022-01-28 DIAGNOSIS — Z8719 Personal history of other diseases of the digestive system: Secondary | ICD-10-CM | POA: Diagnosis not present

## 2022-01-28 DIAGNOSIS — Z01818 Encounter for other preprocedural examination: Secondary | ICD-10-CM | POA: Diagnosis not present

## 2022-02-01 DIAGNOSIS — K6289 Other specified diseases of anus and rectum: Secondary | ICD-10-CM | POA: Diagnosis not present

## 2022-02-01 DIAGNOSIS — K22711 Barrett's esophagus with high grade dysplasia: Secondary | ICD-10-CM | POA: Diagnosis not present

## 2022-02-01 DIAGNOSIS — Z8501 Personal history of malignant neoplasm of esophagus: Secondary | ICD-10-CM | POA: Diagnosis not present

## 2022-02-01 DIAGNOSIS — C159 Malignant neoplasm of esophagus, unspecified: Secondary | ICD-10-CM | POA: Diagnosis not present

## 2022-02-01 DIAGNOSIS — K5010 Crohn's disease of large intestine without complications: Secondary | ICD-10-CM | POA: Diagnosis not present

## 2022-03-04 DIAGNOSIS — Z8249 Family history of ischemic heart disease and other diseases of the circulatory system: Secondary | ICD-10-CM | POA: Diagnosis not present

## 2022-03-04 DIAGNOSIS — Z6833 Body mass index (BMI) 33.0-33.9, adult: Secondary | ICD-10-CM | POA: Diagnosis not present

## 2022-03-04 DIAGNOSIS — M5412 Radiculopathy, cervical region: Secondary | ICD-10-CM | POA: Diagnosis not present

## 2022-03-04 DIAGNOSIS — Z01818 Encounter for other preprocedural examination: Secondary | ICD-10-CM | POA: Diagnosis not present

## 2022-03-04 DIAGNOSIS — K529 Noninfective gastroenteritis and colitis, unspecified: Secondary | ICD-10-CM | POA: Diagnosis not present

## 2022-03-04 DIAGNOSIS — K219 Gastro-esophageal reflux disease without esophagitis: Secondary | ICD-10-CM | POA: Diagnosis not present

## 2022-03-04 DIAGNOSIS — C155 Malignant neoplasm of lower third of esophagus: Secondary | ICD-10-CM | POA: Diagnosis not present

## 2022-03-15 DIAGNOSIS — E669 Obesity, unspecified: Secondary | ICD-10-CM | POA: Diagnosis not present

## 2022-03-15 DIAGNOSIS — J449 Chronic obstructive pulmonary disease, unspecified: Secondary | ICD-10-CM | POA: Diagnosis not present

## 2022-03-15 DIAGNOSIS — K219 Gastro-esophageal reflux disease without esophagitis: Secondary | ICD-10-CM | POA: Diagnosis not present

## 2022-03-15 DIAGNOSIS — Z6834 Body mass index (BMI) 34.0-34.9, adult: Secondary | ICD-10-CM | POA: Diagnosis not present

## 2022-03-15 DIAGNOSIS — C155 Malignant neoplasm of lower third of esophagus: Secondary | ICD-10-CM | POA: Diagnosis not present

## 2022-03-15 DIAGNOSIS — C159 Malignant neoplasm of esophagus, unspecified: Secondary | ICD-10-CM | POA: Diagnosis not present

## 2022-03-15 DIAGNOSIS — F17210 Nicotine dependence, cigarettes, uncomplicated: Secondary | ICD-10-CM | POA: Diagnosis not present

## 2022-03-15 DIAGNOSIS — K2289 Other specified disease of esophagus: Secondary | ICD-10-CM | POA: Diagnosis not present

## 2022-03-15 DIAGNOSIS — K2270 Barrett's esophagus without dysplasia: Secondary | ICD-10-CM | POA: Diagnosis not present

## 2022-03-15 DIAGNOSIS — Z79899 Other long term (current) drug therapy: Secondary | ICD-10-CM | POA: Diagnosis not present

## 2022-03-25 DIAGNOSIS — K5190 Ulcerative colitis, unspecified, without complications: Secondary | ICD-10-CM | POA: Diagnosis not present

## 2022-04-02 DIAGNOSIS — K208 Other esophagitis without bleeding: Secondary | ICD-10-CM | POA: Diagnosis not present

## 2022-04-02 DIAGNOSIS — C155 Malignant neoplasm of lower third of esophagus: Secondary | ICD-10-CM | POA: Diagnosis not present

## 2022-04-02 DIAGNOSIS — B259 Cytomegaloviral disease, unspecified: Secondary | ICD-10-CM | POA: Diagnosis not present

## 2022-04-02 DIAGNOSIS — K529 Noninfective gastroenteritis and colitis, unspecified: Secondary | ICD-10-CM | POA: Diagnosis not present

## 2022-04-12 ENCOUNTER — Encounter: Payer: Self-pay | Admitting: General Surgery

## 2022-04-12 ENCOUNTER — Other Ambulatory Visit: Payer: Self-pay | Admitting: General Surgery

## 2022-04-12 ENCOUNTER — Telehealth: Payer: Self-pay | Admitting: Nurse Practitioner

## 2022-04-12 DIAGNOSIS — C155 Malignant neoplasm of lower third of esophagus: Secondary | ICD-10-CM

## 2022-04-12 NOTE — Telephone Encounter (Signed)
Scheduled appt per 10/16 referral. Pt is aware of appt date and time. Pt is aware to arrive 15 mins prior to appt time and to bring and updated insurance card. Pt is aware of appt location.

## 2022-04-19 ENCOUNTER — Telehealth: Payer: Self-pay | Admitting: Hematology

## 2022-04-19 NOTE — Telephone Encounter (Signed)
R/s pt's new pt appt per pt request. Pt is aware of new appt date/time.

## 2022-04-21 ENCOUNTER — Ambulatory Visit: Payer: BC Managed Care – PPO | Admitting: Nurse Practitioner

## 2022-04-26 ENCOUNTER — Other Ambulatory Visit: Payer: Self-pay

## 2022-04-26 ENCOUNTER — Ambulatory Visit
Admission: RE | Admit: 2022-04-26 | Discharge: 2022-04-26 | Disposition: A | Discharge status: Home / Self Care | Payer: Self-pay | Source: Ambulatory Visit | Attending: Hematology | Admitting: Hematology

## 2022-04-26 ENCOUNTER — Encounter: Payer: Self-pay | Admitting: Hematology

## 2022-04-26 ENCOUNTER — Inpatient Hospital Stay: Payer: BC Managed Care – PPO | Admitting: Licensed Clinical Social Worker

## 2022-04-26 ENCOUNTER — Inpatient Hospital Stay (HOSPITAL_BASED_OUTPATIENT_CLINIC_OR_DEPARTMENT_OTHER): Payer: BC Managed Care – PPO | Admitting: Hematology

## 2022-04-26 VITALS — BP 150/92 | HR 79 | Temp 98.2°F | Resp 18 | Ht 70.0 in | Wt 233.1 lb

## 2022-04-26 DIAGNOSIS — C159 Malignant neoplasm of esophagus, unspecified: Secondary | ICD-10-CM

## 2022-04-26 DIAGNOSIS — I10 Essential (primary) hypertension: Secondary | ICD-10-CM | POA: Diagnosis not present

## 2022-04-26 DIAGNOSIS — B370 Candidal stomatitis: Secondary | ICD-10-CM | POA: Diagnosis not present

## 2022-04-26 DIAGNOSIS — Z808 Family history of malignant neoplasm of other organs or systems: Secondary | ICD-10-CM | POA: Insufficient documentation

## 2022-04-26 DIAGNOSIS — R0902 Hypoxemia: Secondary | ICD-10-CM | POA: Diagnosis not present

## 2022-04-26 DIAGNOSIS — D72829 Elevated white blood cell count, unspecified: Secondary | ICD-10-CM | POA: Diagnosis not present

## 2022-04-26 DIAGNOSIS — J9382 Other air leak: Secondary | ICD-10-CM | POA: Diagnosis not present

## 2022-04-26 DIAGNOSIS — Z1152 Encounter for screening for COVID-19: Secondary | ICD-10-CM | POA: Diagnosis not present

## 2022-04-26 DIAGNOSIS — E669 Obesity, unspecified: Secondary | ICD-10-CM | POA: Diagnosis not present

## 2022-04-26 DIAGNOSIS — E785 Hyperlipidemia, unspecified: Secondary | ICD-10-CM | POA: Diagnosis not present

## 2022-04-26 DIAGNOSIS — Z79899 Other long term (current) drug therapy: Secondary | ICD-10-CM | POA: Diagnosis not present

## 2022-04-26 DIAGNOSIS — Z801 Family history of malignant neoplasm of trachea, bronchus and lung: Secondary | ICD-10-CM | POA: Diagnosis not present

## 2022-04-26 DIAGNOSIS — R0602 Shortness of breath: Secondary | ICD-10-CM | POA: Diagnosis not present

## 2022-04-26 DIAGNOSIS — F17210 Nicotine dependence, cigarettes, uncomplicated: Secondary | ICD-10-CM

## 2022-04-26 DIAGNOSIS — Z862 Personal history of diseases of the blood and blood-forming organs and certain disorders involving the immune mechanism: Secondary | ICD-10-CM | POA: Diagnosis not present

## 2022-04-26 DIAGNOSIS — F17200 Nicotine dependence, unspecified, uncomplicated: Secondary | ICD-10-CM | POA: Diagnosis not present

## 2022-04-26 DIAGNOSIS — Z4682 Encounter for fitting and adjustment of non-vascular catheter: Secondary | ICD-10-CM | POA: Diagnosis not present

## 2022-04-26 DIAGNOSIS — Z720 Tobacco use: Secondary | ICD-10-CM | POA: Diagnosis not present

## 2022-04-26 DIAGNOSIS — K22711 Barrett's esophagus with high grade dysplasia: Secondary | ICD-10-CM | POA: Diagnosis not present

## 2022-04-26 DIAGNOSIS — Z6833 Body mass index (BMI) 33.0-33.9, adult: Secondary | ICD-10-CM | POA: Diagnosis not present

## 2022-04-26 DIAGNOSIS — J9811 Atelectasis: Secondary | ICD-10-CM | POA: Diagnosis not present

## 2022-04-26 DIAGNOSIS — I454 Nonspecific intraventricular block: Secondary | ICD-10-CM | POA: Diagnosis not present

## 2022-04-26 DIAGNOSIS — J449 Chronic obstructive pulmonary disease, unspecified: Secondary | ICD-10-CM | POA: Diagnosis not present

## 2022-04-26 DIAGNOSIS — J9383 Other pneumothorax: Secondary | ICD-10-CM | POA: Diagnosis not present

## 2022-04-26 DIAGNOSIS — C155 Malignant neoplasm of lower third of esophagus: Secondary | ICD-10-CM | POA: Diagnosis not present

## 2022-04-26 DIAGNOSIS — K219 Gastro-esophageal reflux disease without esophagitis: Secondary | ICD-10-CM | POA: Diagnosis not present

## 2022-04-26 DIAGNOSIS — J441 Chronic obstructive pulmonary disease with (acute) exacerbation: Secondary | ICD-10-CM | POA: Diagnosis not present

## 2022-04-26 DIAGNOSIS — Z8249 Family history of ischemic heart disease and other diseases of the circulatory system: Secondary | ICD-10-CM | POA: Diagnosis not present

## 2022-04-26 DIAGNOSIS — J939 Pneumothorax, unspecified: Secondary | ICD-10-CM | POA: Diagnosis not present

## 2022-04-26 DIAGNOSIS — J9312 Secondary spontaneous pneumothorax: Secondary | ICD-10-CM | POA: Diagnosis not present

## 2022-04-26 DIAGNOSIS — R066 Hiccough: Secondary | ICD-10-CM | POA: Diagnosis not present

## 2022-04-26 DIAGNOSIS — Z825 Family history of asthma and other chronic lower respiratory diseases: Secondary | ICD-10-CM | POA: Diagnosis not present

## 2022-04-26 DIAGNOSIS — K5190 Ulcerative colitis, unspecified, without complications: Secondary | ICD-10-CM | POA: Diagnosis not present

## 2022-04-26 DIAGNOSIS — J439 Emphysema, unspecified: Secondary | ICD-10-CM | POA: Diagnosis not present

## 2022-04-26 DIAGNOSIS — I2510 Atherosclerotic heart disease of native coronary artery without angina pectoris: Secondary | ICD-10-CM | POA: Diagnosis not present

## 2022-04-26 NOTE — Progress Notes (Signed)
[place]   Telephone:([phone]) [phone] Fax:([phone]) [place] Note   Patient Care Team: Crecencio Mc, MD as PCP - General (Internal Medicine) Bary Castilla [name], MD as Consulting Physician (General Surgery) Lesly Rubenstein, MD as Consulting Physician (Gastroenterology) Lerry Paterson, MD as Consulting Physician (Thoracic Surgery)  Date of Service:  04/26/2022   CHIEF COMPLAINTS/PURPOSE OF CONSULTATION:  Esophageal Cancer  REFERRING PHYSICIAN:  Dr. Bary Castilla  ASSESSMENT & PLAN:  Bruce Graham is a 51 y.o. male with   1. Esophageal cancer, cT1N0M0, stage MMR normal  -h/o Barrett's esophagus. Screening EGD on 12/16/21 showed only reflux esophagitis, no bleeding. Pathology revealed invasive moderately differentiated adenocarcinoma at 36 cm. No other sites of malignancy. -staging chest CT 12/25/21 was negative outside of distal esophageal wall thickening. -EUS 01/04/22, unable to stage due to no obvious mass. -staging PET scan 01/28/22 showed no FDG-avid disease. -he was referred to Dr. Elenor Quinones and underwent endoscopic resection on 03/15/22.  Pathology confirmed invasive moderately differentiated adenocarcinoma to esophagus at 36 cm, invading submucosal, with positive margins.  Esophagectomy was recommended by Dr. Elenor Quinones, however pt is very reluctant to have surgery. -I discussed that surgical resection/esophagectomy is the standard therapy after endoscopy resection with positive margin for T1b disease and I strongly encourage him to consider it.  He is young and fit, would be a good candidate for surgery. -I discussed the option of concurrent chemoradiation for 5.5  weeks if he still declined surgery.  I discussed the treatment course with him in detail.  He will think about it and agreed to see radiation oncologist Dr. Lisbeth Renshaw. -Patient is most interested in immunotherapy.  I will request PD-L1 and HER2 on his tumor sample at [facility], which can predict his  response to immunotherapy.  I discussed that immunotherapy is not approved for stage I esophageal cancer, but it does not have a role in adjuvant setting for early stage, and it is one of the therapy options for metastatic disease.  Due to his recently diagnosed ulcerative colitis, I am concerned potential worsening colitis when he on monotherapy.I do not think it's a good option for him, but will consider if he refuses all other therapy options.     PLAN:  -referral to rad onc Dr. Lisbeth Renshaw -will request PD-L1 and HER2 on his surgical sample at [facility] -We will present his case in our tumor board conference -I will call him in a few weeks to finalize his treatment plan.    Oncology History  Esophageal cancer ([place])  12/16/2021 Procedure   Upper GI endoscopy, Dr. Bary Castilla  Impression: - LA Grade C reflux esophagitis with no bleeding. Rule out Barrett's esophagus. Biopsied. - Small hiatal hernia. - Normal examined duodenum.   12/16/2021 Initial Biopsy   DIAGNOSIS:  A. ESOPHAGUS, 40 CM; COLD BIOPSY:  - SQUAMOCOLUMNAR MUCOSA WITH INTESTINAL METAPLASIA, COMPATIBLE WITH HISTORY OF BARRETT'S ESOPHAGUS, WITH FOCAL LOW GRADE DYSPLASIA.  - NEGATIVE FOR HIGH-GRADE DYSPLASIA AND MALIGNANCY.   B. ESOPHAGUS, 38 CM; BIOPSY:  - SQUAMOCOLUMNAR MUCOSA WITH INTESTINAL METAPLASIA, COMPATIBLE WITH HISTORY OF BARRETT'S ESOPHAGUS, WITH FOCAL LOW GRADE DYSPLASIA.  - NEGATIVE FOR HIGH-GRADE DYSPLASIA AND MALIGNANCY.   C. ESOPHAGUS, 36 CM; COLD BIOPSY:  - INVASIVE MODERATELY DIFFERENTIATED ADENOCARCINOMA, ARISING IN A BACKGROUND OF BARRETT'S ESOPHAGUS WITH HIGH-GRADE DYSPLASIA.   D. COLON, CECUM; COLD BIOPSY:  - CHRONIC COLITIS WITH MODERATE ACTIVITY (CRYPTITIS AND CRYPT ABSCESSES).  - NEGATIVE FOR GRANULOMA, DYSPLASIA, AND MALIGNANCY.   E. COLON, DESCENDING; COLD  BIOPSY:  - CHRONIC COLITIS WITH MODERATE ACTIVITY (CRYPTITIS AND CRYPT ABSCESSES). - NEGATIVE FOR GRANULOMA, DYSPLASIA, AND MALIGNANCY.   F. RECTUM;  COLD BIOPSY:  - CHRONIC PROCTITIS WITH MODERATE ACTIVITY (CRYPTITIS AND CRYPT ABSCESSES).  - NEGATIVE FOR GRANULOMA, DYSPLASIA, AND MALIGNANCY.   Comment:  Biopsy sections taken from the cecum, descending colon, and rectum display crypt architectural abnormalities (crypt branching and bifid crypts), basal lymphoplasmacytosis, and active intramucosal inflammation ranging from cryptitis to crypt abscesses.  The features are highly suggestive of an evolving inflammatory bowel disease.  Clinical and endoscopic correlation is recommended.    12/17/2021 Initial Diagnosis   Esophageal cancer ([place])   12/25/2021 Imaging   EXAM: CT CHEST WITH CONTRAST  IMPRESSION: 1. Moderate wall thickening involving the distal esophagus near the GE junction but no paraesophageal or upper abdominal lymphadenopathy. 2. No findings for pulmonary metastatic disease. 3. Age advanced atherosclerotic calcifications involving the coronary arteries. 4. Advanced emphysematous changes with paraseptal bulla.   Aortic Atherosclerosis (ICD10-I70.0) and Emphysema (ICD10-J43.9).   01/04/2022 Procedure   Upper EUS, Dr. Mont Dutton  EGD Impressions: - Esophageal mucosal changes consistent with long-segment Barrett's esophagus. - Cratered esophageal ulcer at 40 cm (up to 8 mm in largest dimension). Biopsied. Suspect this is the site of previous biopsy proven adenocarcinoma. - Normal stomach. - Normal examined duodenum.   EUS Impressions: - There was no sign of significant pathology in the esophagus. No obvious esophageal mass was seen. Unable to T stage due to no mass lesion seen. - No periesophageal or perigastric lymph nodes visualized. - Normal visualized portions of the liver. - Normal celiac region.   01/04/2022 Pathology Results   DIAGNOSIS    A. Esophagus, 40 cm, esophageal ulcer, endoscopic biopsy:   Barrett esophagus with high-grade dysplasia. (See comment)   Cytomegalovirus esophagitis.    Comment: A p53  immunostain demonstrates loss of p53 expression within the atypical gastric mucosa, consistent with TP53 null mutant, supporting a diagnosis of high-grade dysplasia. CMV immunohistochemistry highlights rare positive cells, corroborated by viral cytopathic effect on H&E. PASD stain is negative for fungal organisms. Multiple step sections are examined.        01/28/2022 PET scan   FDG PET SBMT W NONDIAGNOSTIC CONCURRENT CT INITIAL   IMPRESSION:  1.  No evidence of FDG avid disease.   2.  Gluteal cleft findings favored to infectious/inflammatory, correlate with physical exam.    03/15/2022 Pathology Results   DIAGNOSIS    A. Esophagus, 36 cm, endomucosal resection:   Invasive, moderately differentiated adenocarcinoma. Tumor is seen throughout a thickened muscularis mucosal layer. Only a small amount of submucosa is present, best seen in block A2, and here tumor is present within the submucosa. Cauterized tumor is present at the deep margin (orange ink) and a peripheral margin (blue ink). Negative for lymphovascular and perineural invasion. See summary in synoptic report.   B. Esophagus, 36 cm #2, endomucosal resection:   Invasive, moderately-differentiated adenocarcinoma. Tumor is seen throughout a thickened muscularis mucosal layer. A good representation of submucosal tissue is also present, with small areas of tumor seen in submucosal tissue and focally at the deep (orange ink) margin. Tumor is also present at a peripheral margin (blue ink). Negative for lymphovascular and perineural invasion. See summary in synoptic report.   C. Esophagus, 36 cm #3, endomucosal resection:   Invasive moderately differentiated adenocarcinoma. Tumor is focally seen in a thickened muscularis mucosal layer. A mucous gland is present but no submucosal tissue is present in this small specimen.  Cauterized tumor is present at orange ink (the margin of this small specimen is entirely inked orange). Negative  for lymphovascular and perineural invasion. See summary in synoptic report.   D. Esophagus, 39 cm, biopsy:   Gastric type mucosa. No squamous mucosa is seen. Negative for intestinal metaplasia.  Negative for dysplasia.   E. Esophagus, 37 cm, biopsy:    Focal area concerning for high grade dysplasia is seen in a background of squamocolumnar junctional mucosa with goblet cell intestinal metaplasia (Barrett's esophagus).  The focal area is best seen on level 1, deeper levels 3-5 do not demonstrate the concerning area. Negative for malignancy.   Comment:  See the synoptic report for summary.  While I very much suspect the T-stage is pT1b (submucosa), this is technically considered indefinite in the presence of positive deep margins. MSI testing has been requested on block B2 of this case; results will be issued separately.        HISTORY OF PRESENTING ILLNESS:   Bruce Graham 51 y.o. male is a here because of esophageal cancer. The patient was referred by Dr. Bary Castilla. The patient presents to the clinic today alone.  He has a history of Barrett's esophagus, previously followed by GI with last EGD in 02/2018. He was referred to Dr. Bary Castilla in GI on 12/14/21 for persistent diarrhea for a few weeks, tiny rectal bleeding, and passing mucus with stools, colonoscopy was recommended as well as consideration for repeat EGD for f/u of Barrett's esophagus. He proceeded to colonoscopy and upper endoscopy on 12/16/21 showing: severe inflammation to entire colon secondary to pancolitis; reflux esophagitis with no bleeding. Pathology showed: the colon and rectum biopsies were all negative for granuloma, dysplasia, and malignancy; esophagus biopsies from 40cm and 38cm were consistent with Barrett's esophagus without high-grade dysplasia and malignancy; esophagus biopsy at 36cm revealed invasive moderately differentiated adenocarcinoma, arising in a background of Barrett's esophagus with high-grade  dysplasia.  Staging chest CT performed on 12/25/21 showed moderate wall thickening of distal esophagus without evidence of lymphadenopathy or pulmonary metastatic disease. He was taken for staging EUS on 01/04/22, but staging was unable to be completed due to no visible mass lesion. Biopsy obtained from a cratered ulcer at 40cm showed Barrett's esophagus with high-grade dysplasia. Staging PET scan on 01/28/22 showed no FDG-avid disease.  He was referred to Dr. Elenor Quinones and was taken for endoscopic resection on 03/15/22. All three sections taken from 36cm showed invasive moderately differentiated adenocarcinoma with submucosal invasion and positive margins. Dr. Elenor Quinones recommended surgery with esophagectomy.  Patient is very reluctant to consider surgery, and quested a second opinion in our cancer center.  His father was treated by one of my partners for lung cancer 4 years ago.  In addition to GERD and Barrett's esophagus, he has a PMHx of.... -HTN -s/p hernia repair -ACL repair  Socially... -current smoker, smokes 2 ppd for last 30 years but he has cut down to 3 to 5 cigarettes a day now. -Single, lives with his 99-year-old child.   REVIEW OF SYSTEMS:    Constitutional: Denies fevers, chills or abnormal night sweats Eyes: Denies blurriness of vision, double vision or watery eyes Ears, nose, mouth, throat, and face: Denies mucositis or sore throat Respiratory: Denies cough, dyspnea or wheezes Cardiovascular: Denies palpitation, chest discomfort or lower extremity swelling Gastrointestinal:  Denies nausea, heartburn or change in bowel habits Skin: Denies abnormal skin rashes Lymphatics: Denies new lymphadenopathy or easy bruising Neurological:Denies numbness, tingling or new weaknesses Behavioral/Psych: Mood is stable, no  new changes  All other systems were reviewed with the patient and are negative.   MEDICAL HISTORY:  Past Medical History:  Diagnosis Date   Abscess of right thigh  06/16/2015   Barrett esophagus    Bronchitis    Complication of anesthesia    hard to wake up after hernia surgery   Ear infection 12/13/2017   taking amoxicillin   Family history of adverse reaction to anesthesia    mom hard to wake up    GERD (gastroesophageal reflux disease)    HLD (hyperlipidemia)    Hydradenitis    Left leg   Hypertension    Intervertebral disc disorder    Myalgia    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/07/2015   Suppurative hidradenitis    Umbilical hernia without obstruction and without gangrene 11/22/2017    SURGICAL HISTORY: Past Surgical History:  Procedure Laterality Date   COLONOSCOPY WITH PROPOFOL N/A 03/06/2018   Procedure: COLONOSCOPY WITH PROPOFOL;  Surgeon: Lin Landsman, MD;  Location: [facility] ENDOSCOPY;  Service: Gastroenterology;  Laterality: N/A;   COLONOSCOPY WITH PROPOFOL N/A 12/16/2021   Procedure: COLONOSCOPY WITH PROPOFOL;  Surgeon: Robert Bellow, MD;  Location: [facility] ENDOSCOPY;  Service: Endoscopy;  Laterality: N/A;   ESOPHAGOGASTRODUODENOSCOPY (EGD) WITH PROPOFOL N/A 03/06/2018   Procedure: ESOPHAGOGASTRODUODENOSCOPY (EGD) WITH PROPOFOL;  Surgeon: Lin Landsman, MD;  Location[facility] ENDOSCOPY;  Service: Gastroenterology;  Laterality: N/A;   ESOPHAGOGASTRODUODENOSCOPY (EGD) WITH PROPOFOL N/A 12/16/2021   Procedure: ESOPHAGOGASTRODUODENOSCOPY (EGD) WITH PROPOFOL;  Surgeon: Robert Bellow, MD;  Location: [facility] ENDOSCOPY;  Service: Endoscopy;  Laterality: N/A;   HERNIA REPAIR     INCISION AND DRAINAGE ABSCESS N/A 05/02/2020   Procedure: INCISION AND DRAINAGE ABSCESS;  Surgeon: Robert Bellow, MD;  Location: [facility] ORS;  Service: General;  Laterality: N/A;   INCISION AND DRAINAGE PERIRECTAL ABSCESS Right 06/16/2015   Procedure: IRRIGATION AND DEBRIDEMENT right inner thigh ABSCESS;  Surgeon: Florene Glen, MD;  Location: [facility] ORS;  Service: General;  Laterality: Right;   INSERTION OF MESH N/A 12/20/2017   Procedure: INSERTION OF  MESH;  Surgeon: [name], MD;  Location: [facility] ORS;  Service: General;  Laterality: N/A;   KNEE ARTHROSCOPY WITH ANTERIOR CRUCIATE LIGAMENT (ACL) REPAIR Left 2002   KNEE SURGERY Left    PILONIDAL CYST EXCISION N/A 05/02/2020   Procedure: CYST EXCISION PILONIDAL EXTENSIVE;  Surgeon: Robert Bellow, MD;  Location: [facility] ORS;  Service: General;  Laterality: N/A;   UMBILICAL HERNIA REPAIR N/A 12/20/2017   Procedure: HERNIA REPAIR UMBILICAL ADULT;  Surgeon: [name], MD;  Location: [facility] ORS;  Service: General;  Laterality: N/A;    SOCIAL HISTORY: Social History   Socioeconomic History   Marital status: Single    Spouse name: Not on file   Number of children: Not on file   Years of education: Not on file   Highest education level: Not on file  Occupational History   Not on file  Tobacco Use   Smoking status: Every Day    Packs/day: 2.00    Years: 30.00    Total pack years: 60.00    Types: Cigarettes   Smokeless tobacco: Never  Vaping Use   Vaping Use: Never used  Substance and Sexual Activity   Alcohol use: Not Currently    Comment: social   Drug use: No   Sexual activity: Not Currently  Other Topics Concern   Not on file  Social History Narrative   Not on file   Social Determinants  of Health   Financial Resource Strain: Not on file  Food Insecurity: Not on file  Transportation Needs: Not on file  Physical Activity: Not on file  Stress: Not on file  Social Connections: Not on file  Intimate Partner Violence: Not on file    FAMILY HISTORY: Family History  Problem Relation Age of Onset   Heart disease Father 57   Cancer Father 10       Lung Cancer   Cancer Paternal Grandfather 89       in the bones    ALLERGIES:  has No Known Allergies.  MEDICATIONS:  Current Outpatient Medications  Medication Sig Dispense Refill   baclofen (LIORESAL) 10 MG tablet Take 1 tablet (10 mg total) by mouth 2 (two) times daily. 20 each 0   ezetimibe (ZETIA) 10 MG  tablet Take 1 tablet (10 mg total) by mouth daily. 30 tablet 2   gabapentin (NEURONTIN) 300 MG capsule Take by mouth.     loperamide (IMODIUM) 2 MG capsule Take 4 mg by mouth as needed for diarrhea or loose stools ([facility]).     omeprazole (PRILOSEC) 40 MG capsule Take 1 capsule (40 mg total) by mouth daily. 90 capsule 1   potassium chloride 20 MEQ/15ML (10%) SOLN Take 15 mLs (20 mEq total) by mouth daily for 4 days. 60 mL 0   predniSONE (DELTASONE) 10 MG tablet Six tablets today, decrease by one tablet daily. (6,5,4,3,2,1) 21 tablet 0   No current facility-administered medications for this visit.    PHYSICAL EXAMINATION: ECOG PERFORMANCE STATUS: 0 - Asymptomatic  Vitals:   04/26/22 1506  BP: (!) 150/92  Pulse: 79  Resp: 18  Temp: 98.2 F (36.8 C)  SpO2: 98%   Filed Weights   04/26/22 1506  Weight: 233 lb 1.6 oz (105.7 kg)    GENERAL:alert, no distress and comfortable SKIN: skin color, texture, turgor are normal, no rashes or significant lesions EYES: normal, Conjunctiva are pink and non-injected, sclera clear NECK: supple, thyroid normal size, non-tender, without nodularity LYMPH:  no palpable lymphadenopathy in the cervical, axillary  LUNGS: clear to auscultation and percussion with normal breathing effort HEART: regular rate & rhythm and no murmurs and no lower extremity edema ABDOMEN:abdomen soft, non-tender and normal bowel sounds Musculoskeletal:no cyanosis of digits and no clubbing  NEURO: alert & oriented x 3 with fluent speech, no focal motor/sensory deficits  LABORATORY DATA:  I have reviewed the data as listed    Latest Ref Rng & Units 01/04/2022    8:10 AM 12/10/2021    8:15 AM 12/04/2021    3:45 PM  CBC  WBC 4.0 - 10.5 K/uL 13.0  10.9  11.7   Hemoglobin 13.0 - 17.0 g/dL 12.2  12.2  13.2   Hematocrit 39.0 - 52.0 % 38.2  36.8  38.5   Platelets 150.0 - 400.0 K/uL 348.0  550.0  460        Latest Ref Rng & Units 12/10/2021    8:15 AM 12/07/2021   12:55 PM 12/04/2021     3:45 PM  CMP  Glucose 70 - 99 mg/dL 76  97  CANCELED   BUN 6 - 23 mg/dL 4  6  CANCELED   Creatinine 0.40 - 1.50 mg/dL 0.98  0.84  CANCELED   Sodium 135 - 145 mEq/L 140  139  CANCELED   Potassium 3.5 - 5.1 mEq/L 3.3  3.6  CANCELED   Chloride 96 - 112 mEq/L 103  102  CANCELED  CO2 19 - 32 mEq/L 29  27  CANCELED   Calcium 8.4 - 10.5 mg/dL 8.6  8.6  CANCELED   Total Protein 6.0 - 8.3 g/dL 6.7     Total Bilirubin 0.2 - 1.2 mg/dL 0.3     Alkaline Phos 39 - 117 U/L 59     AST 0 - 37 U/L 15     ALT 0 - 53 U/L 11        RADIOGRAPHIC STUDIES: I have personally reviewed the radiological images as listed and agreed with the findings in the report. No results found.   Orders Placed This Encounter  Procedures   Ambulatory referral to Radiation Oncology    Referral Priority:   Routine    Referral Type:   Consultation    Referral Reason:   Specialty Services Required    Requested Specialty:   Radiation Oncology    Number of Visits Requested:   1    All questions were answered. The patient knows to call the clinic with any problems, questions or concerns. The total time spent in the appointment was 50 minutes.     Truitt Merle, MD 04/26/2022   I, Wilburn Mylar, am acting as scribe for Truitt Merle, MD.   I have reviewed the above documentation for accuracy and completeness, and I agree with the above.

## 2022-04-26 NOTE — Progress Notes (Signed)
Faxed request to have 01/28/2022 PET scan images pushed to power share.

## 2022-04-26 NOTE — Progress Notes (Signed)
[facility] [facility]  Initial Assessment   Bruce Graham is a 51 y.o. year old male presenting alone. Clinical Social Work was referred by medical provider for assessment of psychosocial needs.   SDOH (Social Determinants of Health) assessments performed: Yes   SDOH Screenings   Depression (PHQ2-9): Low Risk  (02/06/2021)  Tobacco Use: High Risk (04/26/2022)     Distress Screen completed: No     No data to display            Family/Social Information:  Housing Arrangement: patient lives alone Family members/support persons in your life? Pt states his mother resides next door and is in good health.  Per pt he has supportive friends who will assist if needed. Transportation concerns: no  Employment: Working full time .  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  Of note pt's father was diagnosed with esophageal cancer and pt was present for his treatment. Services Currently in place:  none  Coping/ Adjustment to diagnosis: Patient understands treatment plan and what happens next? Pt reports he is at [facility] for a second opinion as he has already spoken to a physician at [facility].  Pt does not yet know what all of his treatment options may be.   Concerns about diagnosis and/or treatment:  no concerns expressed at this time. Patient reported stressors:  none reported at this time Hopes and/or priorities: Pt's priority is to decide upon a treatment plan with the hope of a positive outcome Patient enjoys  not addressed Current coping skills/ strengths: Capable of independent living , Financial means , Motivation for treatment/growth , and Physical Health      SUMMARY: Current SDOH Barriers:  No barriers identified at this time  Clinical Social Work Clinical Goal(s):  No clinical social work goals at this time  Interventions: Discussed common feeling and emotions when being diagnosed with cancer, and the importance of support during treatment Informed patient of the support team roles and support services at [facility] Provided [place] contact information and encouraged patient to call with any questions or concerns   Follow Up Plan: Patient will contact CSW with any support or resource needs Patient verbalizes understanding of plan: Yes    Henriette Combs, LCSW

## 2022-04-27 ENCOUNTER — Telehealth: Payer: Self-pay | Admitting: *Deleted

## 2022-04-27 NOTE — Telephone Encounter (Signed)
Office notes faxed to Dr Bary Castilla

## 2022-04-27 NOTE — Telephone Encounter (Signed)
-----   Message from Truitt Merle, MD sent at 04/27/2022 11:27 AM EDT ----- Please fax my consult note from yesterday to his referral physician, thx   YF

## 2022-04-28 ENCOUNTER — Observation Stay (HOSPITAL_COMMUNITY): Payer: BC Managed Care – PPO

## 2022-04-28 ENCOUNTER — Ambulatory Visit (INDEPENDENT_AMBULATORY_CARE_PROVIDER_SITE_OTHER): Payer: BC Managed Care – PPO

## 2022-04-28 ENCOUNTER — Encounter (HOSPITAL_COMMUNITY): Payer: Self-pay | Admitting: Emergency Medicine

## 2022-04-28 ENCOUNTER — Other Ambulatory Visit: Payer: Self-pay

## 2022-04-28 ENCOUNTER — Ambulatory Visit (HOSPITAL_COMMUNITY)
Admission: EM | Admit: 2022-04-28 | Discharge: 2022-04-28 | Disposition: A | Discharge status: Home / Self Care | Payer: BC Managed Care – PPO | Attending: Internal Medicine | Admitting: Internal Medicine

## 2022-04-28 ENCOUNTER — Inpatient Hospital Stay (HOSPITAL_COMMUNITY)
Admission: EM | Admit: 2022-04-28 | Discharge: 2022-05-02 | DRG: 164 | Disposition: A | Discharge status: Home / Self Care | Payer: BC Managed Care – PPO | Source: Ambulatory Visit | Attending: Internal Medicine | Admitting: Internal Medicine

## 2022-04-28 ENCOUNTER — Emergency Department (HOSPITAL_COMMUNITY): Payer: BC Managed Care – PPO

## 2022-04-28 DIAGNOSIS — Z862 Personal history of diseases of the blood and blood-forming organs and certain disorders involving the immune mechanism: Secondary | ICD-10-CM

## 2022-04-28 DIAGNOSIS — Z825 Family history of asthma and other chronic lower respiratory diseases: Secondary | ICD-10-CM

## 2022-04-28 DIAGNOSIS — R0902 Hypoxemia: Secondary | ICD-10-CM | POA: Diagnosis not present

## 2022-04-28 DIAGNOSIS — Z8249 Family history of ischemic heart disease and other diseases of the circulatory system: Secondary | ICD-10-CM

## 2022-04-28 DIAGNOSIS — Z4682 Encounter for fitting and adjustment of non-vascular catheter: Secondary | ICD-10-CM | POA: Diagnosis not present

## 2022-04-28 DIAGNOSIS — D72829 Elevated white blood cell count, unspecified: Secondary | ICD-10-CM | POA: Diagnosis not present

## 2022-04-28 DIAGNOSIS — J9383 Other pneumothorax: Secondary | ICD-10-CM | POA: Diagnosis not present

## 2022-04-28 DIAGNOSIS — I10 Essential (primary) hypertension: Secondary | ICD-10-CM | POA: Diagnosis present

## 2022-04-28 DIAGNOSIS — Z1152 Encounter for screening for COVID-19: Secondary | ICD-10-CM

## 2022-04-28 DIAGNOSIS — C159 Malignant neoplasm of esophagus, unspecified: Secondary | ICD-10-CM | POA: Diagnosis present

## 2022-04-28 DIAGNOSIS — J939 Pneumothorax, unspecified: Secondary | ICD-10-CM | POA: Diagnosis present

## 2022-04-28 DIAGNOSIS — Z6833 Body mass index (BMI) 33.0-33.9, adult: Secondary | ICD-10-CM

## 2022-04-28 DIAGNOSIS — E669 Obesity, unspecified: Secondary | ICD-10-CM | POA: Diagnosis present

## 2022-04-28 DIAGNOSIS — J439 Emphysema, unspecified: Secondary | ICD-10-CM | POA: Diagnosis not present

## 2022-04-28 DIAGNOSIS — K5190 Ulcerative colitis, unspecified, without complications: Secondary | ICD-10-CM | POA: Diagnosis present

## 2022-04-28 DIAGNOSIS — I454 Nonspecific intraventricular block: Secondary | ICD-10-CM | POA: Diagnosis present

## 2022-04-28 DIAGNOSIS — R0602 Shortness of breath: Secondary | ICD-10-CM | POA: Diagnosis not present

## 2022-04-28 DIAGNOSIS — J9312 Secondary spontaneous pneumothorax: Secondary | ICD-10-CM | POA: Diagnosis not present

## 2022-04-28 DIAGNOSIS — K219 Gastro-esophageal reflux disease without esophagitis: Secondary | ICD-10-CM | POA: Diagnosis present

## 2022-04-28 DIAGNOSIS — J9382 Other air leak: Secondary | ICD-10-CM | POA: Diagnosis not present

## 2022-04-28 DIAGNOSIS — J441 Chronic obstructive pulmonary disease with (acute) exacerbation: Secondary | ICD-10-CM | POA: Diagnosis present

## 2022-04-28 DIAGNOSIS — F17210 Nicotine dependence, cigarettes, uncomplicated: Secondary | ICD-10-CM | POA: Diagnosis present

## 2022-04-28 DIAGNOSIS — B370 Candidal stomatitis: Secondary | ICD-10-CM | POA: Diagnosis not present

## 2022-04-28 DIAGNOSIS — R066 Hiccough: Secondary | ICD-10-CM | POA: Diagnosis not present

## 2022-04-28 DIAGNOSIS — Z720 Tobacco use: Secondary | ICD-10-CM | POA: Diagnosis present

## 2022-04-28 DIAGNOSIS — Z79899 Other long term (current) drug therapy: Secondary | ICD-10-CM

## 2022-04-28 DIAGNOSIS — J449 Chronic obstructive pulmonary disease, unspecified: Secondary | ICD-10-CM

## 2022-04-28 DIAGNOSIS — E785 Hyperlipidemia, unspecified: Secondary | ICD-10-CM | POA: Diagnosis present

## 2022-04-28 DIAGNOSIS — Z801 Family history of malignant neoplasm of trachea, bronchus and lung: Secondary | ICD-10-CM

## 2022-04-28 LAB — CBC WITH DIFFERENTIAL/PLATELET
Abs Immature Granulocytes: 0.05 10*3/uL (ref 0.00–0.07)
Basophils Absolute: 0 10*3/uL (ref 0.0–0.1)
Basophils Relative: 0 %
Eosinophils Absolute: 0.1 10*3/uL (ref 0.0–0.5)
Eosinophils Relative: 1 %
HCT: 44.1 % (ref 39.0–52.0)
Hemoglobin: 14.7 g/dL (ref 13.0–17.0)
Immature Granulocytes: 0 %
Lymphocytes Relative: 28 %
Lymphs Abs: 3.3 10*3/uL (ref 0.7–4.0)
MCH: 26.9 pg (ref 26.0–34.0)
MCHC: 33.3 g/dL (ref 30.0–36.0)
MCV: 80.8 fL (ref 80.0–100.0)
Monocytes Absolute: 0.8 10*3/uL (ref 0.1–1.0)
Monocytes Relative: 7 %
Neutro Abs: 7.5 10*3/uL (ref 1.7–7.7)
Neutrophils Relative %: 64 %
Platelets: 438 10*3/uL — ABNORMAL HIGH (ref 150–400)
RBC: 5.46 MIL/uL (ref 4.22–5.81)
RDW: 16.9 % — ABNORMAL HIGH (ref 11.5–15.5)
WBC: 11.8 10*3/uL — ABNORMAL HIGH (ref 4.0–10.5)
nRBC: 0 % (ref 0.0–0.2)

## 2022-04-28 LAB — CBC
HCT: 43.1 % (ref 39.0–52.0)
Hemoglobin: 14.1 g/dL (ref 13.0–17.0)
MCH: 26.3 pg (ref 26.0–34.0)
MCHC: 32.7 g/dL (ref 30.0–36.0)
MCV: 80.4 fL (ref 80.0–100.0)
Platelets: 405 10*3/uL — ABNORMAL HIGH (ref 150–400)
RBC: 5.36 MIL/uL (ref 4.22–5.81)
RDW: 16.7 % — ABNORMAL HIGH (ref 11.5–15.5)
WBC: 10.4 10*3/uL (ref 4.0–10.5)
nRBC: 0 % (ref 0.0–0.2)

## 2022-04-28 LAB — BASIC METABOLIC PANEL
Anion gap: 10 (ref 5–15)
BUN: 5 mg/dL — ABNORMAL LOW (ref 6–20)
CO2: 24 mmol/L (ref 22–32)
Calcium: 9.1 mg/dL (ref 8.9–10.3)
Chloride: 105 mmol/L (ref 98–111)
Creatinine, Ser: 0.99 mg/dL (ref 0.61–1.24)
GFR, Estimated: >60 mL/min (ref >60)
Glucose, Bld: 109 mg/dL — ABNORMAL HIGH (ref 70–99)
Potassium: 3.8 mmol/L (ref 3.5–5.1)
Sodium: 139 mmol/L (ref 135–145)

## 2022-04-28 LAB — COMPREHENSIVE METABOLIC PANEL
ALT: 13 U/L (ref 0–44)
AST: 20 U/L (ref 15–41)
Albumin: 3.3 g/dL — ABNORMAL LOW (ref 3.5–5.0)
Alkaline Phosphatase: 76 U/L (ref 38–126)
Anion gap: 12 (ref 5–15)
BUN: 6 mg/dL (ref 6–20)
CO2: 22 mmol/L (ref 22–32)
Calcium: 9 mg/dL (ref 8.9–10.3)
Chloride: 105 mmol/L (ref 98–111)
Creatinine, Ser: 0.89 mg/dL (ref 0.61–1.24)
GFR, Estimated: >60 mL/min (ref >60)
Glucose, Bld: 90 mg/dL (ref 70–99)
Potassium: 3.5 mmol/L (ref 3.5–5.1)
Sodium: 139 mmol/L (ref 135–145)
Total Bilirubin: 1 mg/dL (ref 0.3–1.2)
Total Protein: 7.8 g/dL (ref 6.5–8.1)

## 2022-04-28 LAB — SARS CORONAVIRUS 2 BY RT PCR: SARS Coronavirus 2 by RT PCR: NEGATIVE

## 2022-04-28 MED ORDER — ONDANSETRON HCL 4 MG PO TABS: 4.0000 mg | ORAL_TABLET | Freq: Four times a day (QID) | ORAL | Status: DC | PRN

## 2022-04-28 MED ORDER — MELATONIN 5 MG PO TABS
5.0000 mg | ORAL_TABLET | Freq: Once | ORAL | Status: AC
  Administered 2022-04-28: 5 mg via ORAL
  Filled 2022-04-28: qty 1

## 2022-04-28 MED ORDER — SODIUM CHLORIDE 0.9% FLUSH
10.0000 mL | Freq: Three times a day (TID) | INTRAVENOUS | Status: DC
  Administered 2022-04-29 – 2022-05-02 (×5): 10 mL via INTRAPLEURAL

## 2022-04-28 MED ORDER — CEFAZOLIN SODIUM-DEXTROSE 2-4 GM/100ML-% IV SOLN
2.0000 g | INTRAVENOUS | Status: AC
  Administered 2022-04-29: 2 g via INTRAVENOUS
  Filled 2022-04-28: qty 100

## 2022-04-28 MED ORDER — ONDANSETRON HCL 4 MG/2ML IJ SOLN
4.0000 mg | Freq: Four times a day (QID) | INTRAMUSCULAR | Status: DC | PRN
  Administered 2022-04-29: 4 mg via INTRAVENOUS
  Filled 2022-04-28: qty 2

## 2022-04-28 MED ORDER — ACETAMINOPHEN 650 MG RE SUPP: 650.0000 mg | Freq: Four times a day (QID) | RECTAL | Status: DC | PRN

## 2022-04-28 MED ORDER — MORPHINE SULFATE (PF) 2 MG/ML IV SOLN
2.0000 mg | INTRAVENOUS | Status: DC | PRN
  Administered 2022-04-28 – 2022-04-30 (×7): 2 mg via INTRAVENOUS
  Filled 2022-04-28 (×7): qty 1

## 2022-04-28 MED ORDER — ACETAMINOPHEN 325 MG PO TABS
650.0000 mg | ORAL_TABLET | Freq: Four times a day (QID) | ORAL | Status: DC | PRN
  Administered 2022-04-28: 650 mg via ORAL
  Filled 2022-04-28 (×2): qty 2

## 2022-04-28 MED ORDER — MORPHINE SULFATE (PF) 2 MG/ML IV SOLN
INTRAVENOUS | Status: AC
  Administered 2022-04-28: 2 mg via INTRAVENOUS
  Filled 2022-04-28: qty 1

## 2022-04-28 NOTE — ED Notes (Signed)
Placed on 2lpm via [place] due to CC

## 2022-04-28 NOTE — ED Notes (Signed)
Patient transported to CT 

## 2022-04-28 NOTE — ED Provider Triage Note (Signed)
Emergency Medicine Provider Triage Evaluation Note  Bruce Graham , a 51 y.o. male  was evaluated in triage.  Pt complains of spontaneous pneumothorax.  Patient reports history of emphysema.  States that over the past 2 to 3 days had increasing cough.  He states yesterday, he had a coughing episode and since then has been more short of breath.  Has been unable to take a deep breath.  Imaging done this morning which confirms right sided pneumothorax.  Review of Systems  Positive: See above Negative:   Physical Exam  BP (!) 180/102 (BP Location: Right Arm)   Pulse (!) 44   Temp 97.6 F (36.4 C) (Oral)   Resp (!) 22   SpO2 97%  Gen:   Awake, no distress   Resp:  Normal effort  MSK:   Moves extremities without difficulty  Other:  Decreased lung sounds on right.  No hypotension or tracheal deviation noted.  Medical Decision Making  Medically screening exam initiated at 10:42 AM.  Appropriate orders placed.  Meyer Arora was informed that the remainder of the evaluation will be completed by another provider, this initial triage assessment does not replace that evaluation, and the importance of remaining in the ED until their evaluation is complete.     Wilnette Kales, [place] 04/28/22 1044

## 2022-04-28 NOTE — ED Triage Notes (Addendum)
Pt reports since yesterday having cough, congestion, SOB. Pain with coughing in chest. Took Robitussin last night and Mucinex today.

## 2022-04-28 NOTE — Procedures (Signed)
Insertion of Chest Tube Procedure Note  Ehab Humber  [id number]  1971/01/08  Date:04/28/22  Time:5:44 PM    Provider Performing: Freddi Starr   Procedure: Chest Tube Insertion (83151)  Indication(s) Pneumothorax  Consent Risks of the procedure as well as the alternatives and risks of each were explained to the patient and/or caregiver.  Consent for the procedure was obtained and is signed in the bedside chart  Anesthesia Topical only with 1% lidocaine    Time Out Verified patient identification, verified procedure, site/side was marked, verified correct patient position, special equipment/implants available, medications/allergies/relevant history reviewed, required imaging and test results available.   Sterile Technique Maximal sterile technique including full sterile barrier drape, hand hygiene, sterile gown, sterile gloves, mask, hair covering, sterile ultrasound probe cover (if used).   Procedure Description Ultrasound not used to identify appropriate pleural anatomy for placement and overlying skin marked. Area of placement cleaned and draped in sterile fashion.  A 14 French pigtail pleural catheter was placed into the right pleural space using Seldinger technique. Appropriate return of air was obtained.  The tube was connected to atrium and placed on -20 cm H2O wall suction.   Complications/Tolerance None; patient tolerated the procedure well. Chest X-ray is ordered to verify placement.   EBL Minimal  Specimen(s) none

## 2022-04-28 NOTE — Consult Note (Signed)
Reason for Consult:spontaneous pneumothorax Referring Physician: Dr. Irine Seal Bromwell is an 51 y.o. male.  HPI: 51 yo man with history of tobacco abuse, reflux, Barrett's esophagus, recently diagnosed stage IB esophageal adenocarcinoma, hypertension, hyperlipidemia and hydradenitis.  He presented with right sided CP and shortness of breath.  Sudden onset yesterday after coughing.  He went to Urgent Care this morning with persistent shortness of breath and a CXR showed a right pneumothorax.  CT chest showed a large right pneumothorax with severe emphysema and multiple large blebs/ bullae.  Currently not in distress, but still symptomatic.  Dr. Erin Fulling preparing to place a pigtail catheter.  Smoked 2 ppd for 30 years.  No history of CAD, DVT, stroke, prior pneumothorax.  Was recently diagnosed with early stage esophageal cancer.  Had submucosal resection by Dr. Elenor Quinones at [facility] Has not yet decided whether or not to have esophagectomy.  Past Medical History:  Diagnosis Date   Abscess of right thigh 06/16/2015   Barrett esophagus    Bronchitis    Complication of anesthesia    hard to wake up after hernia surgery   Ear infection 12/13/2017   taking amoxicillin   Family history of adverse reaction to anesthesia    mom hard to wake up    GERD (gastroesophageal reflux disease)    HLD (hyperlipidemia)    Hydradenitis    Left leg   Hypertension    Intervertebral disc disorder    Myalgia    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/07/2015   Suppurative hidradenitis    Umbilical hernia without obstruction and without gangrene 11/22/2017    Past Surgical History:  Procedure Laterality Date   COLONOSCOPY WITH PROPOFOL N/A 03/06/2018   Procedure: COLONOSCOPY WITH PROPOFOL;  Surgeon: Lin Landsman, MD;  Location: [facility] ENDOSCOPY;  Service: Gastroenterology;  Laterality: N/A;   COLONOSCOPY WITH PROPOFOL N/A 12/16/2021   Procedure: COLONOSCOPY WITH PROPOFOL;  Surgeon: Robert Bellow, MD;   Location: [facility] ENDOSCOPY;  Service: Endoscopy;  Laterality: N/A;   ESOPHAGOGASTRODUODENOSCOPY (EGD) WITH PROPOFOL N/A 03/06/2018   Procedure: ESOPHAGOGASTRODUODENOSCOPY (EGD) WITH PROPOFOL;  Surgeon: Lin Landsman, MD;  Location: [facility] ENDOSCOPY;  Service: Gastroenterology;  Laterality: N/A;   ESOPHAGOGASTRODUODENOSCOPY (EGD) WITH PROPOFOL N/A 12/16/2021   Procedure: ESOPHAGOGASTRODUODENOSCOPY (EGD) WITH PROPOFOL;  Surgeon: Robert Bellow, MD;  Location: [facility] ENDOSCOPY;  Service: Endoscopy;  Laterality: N/A;   HERNIA REPAIR     INCISION AND DRAINAGE ABSCESS N/A 05/02/2020   Procedure: INCISION AND DRAINAGE ABSCESS;  Surgeon: Robert Bellow, MD;  Location: [facility] ORS;  Service: General;  Laterality: N/A;   INCISION AND DRAINAGE PERIRECTAL ABSCESS Right 06/16/2015   Procedure: IRRIGATION AND DEBRIDEMENT right inner thigh ABSCESS;  Surgeon: Florene Glen, MD;  Location: [facility] ORS;  Service: General;  Laterality: Right;   INSERTION OF MESH N/A 12/20/2017   Procedure: INSERTION OF MESH;  Surgeon: [name], MD;  Location: [facility] ORS;  Service: General;  Laterality: N/A;   KNEE ARTHROSCOPY WITH ANTERIOR CRUCIATE LIGAMENT (ACL) REPAIR Left 2002   KNEE SURGERY Left    PILONIDAL CYST EXCISION N/A 05/02/2020   Procedure: CYST EXCISION PILONIDAL EXTENSIVE;  Surgeon: Robert Bellow, MD;  Location: [facility] ORS;  Service: General;  Laterality: N/A;   UMBILICAL HERNIA REPAIR N/A 12/20/2017   Procedure: HERNIA REPAIR UMBILICAL ADULT;  Surgeon: [name], MD;  Location: [facility] ORS;  Service: General;  Laterality: N/A;    Family History  Problem Relation Age of Onset   Heart disease Father 71  Cancer Father 44       Lung Cancer   Cancer Paternal Grandfather 76       in the bones    Social History:  reports that he has been smoking cigarettes. He has a 60.00 pack-year smoking history. He has never used smokeless tobacco. He reports that he does not currently use alcohol. He  reports that he does not use drugs.  Allergies: No Known Allergies  Medications: - Ezetimide Mesalamin Prilosec Prednisone Neurontin Mucinex Baclofen  Results for orders placed or performed during the hospital encounter of 04/28/22 (from the past 48 hour(s))  CBC with Differential/Platelet     Status: Abnormal   Collection Time: 04/28/22 11:55 AM  Result Value Ref Range   WBC 11.8 (H) 4.0 - 10.5 K/uL   RBC 5.46 4.22 - 5.81 MIL/uL   Hemoglobin 14.7 13.0 - 17.0 g/dL   HCT 44.1 39.0 - 52.0 %   MCV 80.8 80.0 - 100.0 fL   MCH 26.9 26.0 - 34.0 pg   MCHC 33.3 30.0 - 36.0 g/dL   RDW 16.9 (H) 11.5 - 15.5 %   Platelets 438 (H) 150 - 400 K/uL   nRBC 0.0 0.0 - 0.2 %   Neutrophils Relative % 64 %   Neutro Abs 7.5 1.7 - 7.7 K/uL   Lymphocytes Relative 28 %   Lymphs Abs 3.3 0.7 - 4.0 K/uL   Monocytes Relative 7 %   Monocytes Absolute 0.8 0.1 - 1.0 K/uL   Eosinophils Relative 1 %   Eosinophils Absolute 0.1 0.0 - 0.5 K/uL   Basophils Relative 0 %   Basophils Absolute 0.0 0.0 - 0.1 K/uL   Immature Granulocytes 0 %   Abs Immature Granulocytes 0.05 0.00 - 0.07 K/uL    Comment: Performed at [facility], [address], [place], [place] [address]  Basic metabolic panel     Status: Abnormal   Collection Time: 04/28/22 11:55 AM  Result Value Ref Range   Sodium 139 135 - 145 mmol/L   Potassium 3.8 3.5 - 5.1 mmol/L   Chloride 105 98 - 111 mmol/L   CO2 24 22 - 32 mmol/L   Glucose, Bld 109 (H) 70 - 99 mg/dL    Comment: Glucose reference range applies only to samples taken after fasting for at least 8 hours.   BUN 5 (L) 6 - 20 mg/dL   Creatinine, Ser 0.99 0.61 - 1.24 mg/dL   Calcium 9.1 8.9 - 10.3 mg/dL   GFR, Estimated >60 >60 mL/min    Comment: (NOTE) Calculated using the CKD-EPI Creatinine Equation (2021)    Anion gap 10 5 - 15    Comment: Performed at [facility] [address] [place], [place] [address]    CT CHEST WO CONTRAST  Result Date: 04/28/2022 CLINICAL  DATA:  Pneumothorax EXAM: CT CHEST WITHOUT CONTRAST TECHNIQUE: Multidetector CT imaging of the chest was performed following the standard protocol without IV contrast. RADIATION DOSE REDUCTION: This exam was performed according to the departmental dose-optimization program which includes automated exposure control, adjustment of the mA and/or kV according to patient size and/or use of iterative reconstruction technique. COMPARISON:  Same day chest radiograph. FINDINGS: Cardiovascular: Coronary artery atherosclerosis. Normal heart size. No pericardial effusion. Mediastinum/Nodes: No enlarged mediastinal or axillary lymph nodes. Thyroid gland, trachea, and esophagus demonstrate no significant findings. Lungs/Pleura: Moderate emphysema with apical blebs. Moderate to large right pneumothorax. Scarring/atelectasis in the right upper lobe. No consolidation. No sizable pleural effusions. Upper  Abdomen: No acute findings. Small gallstones, partially imaged. Diverticulosis. Musculoskeletal: No fracture is seen. IMPRESSION: 1. Moderate to large right pneumothorax. 2. Scarring/atelectasis in the right upper lobe. 3. Emphysema (ICD10-J43.9) with apical blebs. Electronically Signed   By: Margaretha Sheffield M.D.   On: 04/28/2022 12:57   DG Chest 2 View  Result Date: 04/28/2022 CLINICAL DATA:  Shortness of breath EXAM: CHEST - 2 VIEW COMPARISON:  05/24/2021 FINDINGS: Moderate right pneumothorax seen laterally and apically, at least 20%. Chronic interstitial coarsening with apical emphysema and bullae. Normal heart size. Aortic tortuosity. These results were called by telephone at the time of interpretation on 04/28/2022 at 10:00 am to provider [facility] , who verbally acknowledged these results. IMPRESSION: 1. Moderate right pneumothorax, at least 20%. 2. Emphysema with apical blebs. Electronically Signed   By: Jorje Guild M.D.   On: 04/28/2022 10:23    I personally reviewed the Ct images. There is a large right  pneumothorax.  Multiple large blebs, bullae in setting of emphysema.  Also blebs but no pneumothorax on left.  Review of Systems  Constitutional:  Negative for activity change and unexpected weight change.  HENT:  Negative for trouble swallowing and voice change.   Respiratory:  Positive for cough and shortness of breath.   Cardiovascular:  Positive for chest pain (see HPI).  Gastrointestinal:  Positive for abdominal pain (reflux) and diarrhea.  Genitourinary:  Negative for dysuria.  Musculoskeletal:  Positive for back pain.  Neurological:  Negative for syncope and weakness.  Hematological:  Negative for adenopathy. Does not bruise/bleed easily.  All other systems reviewed and are negative.  Blood pressure (!) 148/92, pulse (!) 119, temperature 97.8 F (36.6 C), temperature source Oral, resp. rate (!) 21, SpO2 98 %. Physical Exam Vitals reviewed.  Constitutional:      General: He is not in acute distress.    Appearance: He is obese.  HENT:     Head: Normocephalic and atraumatic.  Eyes:     Extraocular Movements: Extraocular movements intact.  Cardiovascular:     Rate and Rhythm: Normal rate. Rhythm irregular.     Heart sounds: Normal heart sounds. No murmur heard. Pulmonary:     Effort: Pulmonary effort is normal. No respiratory distress.     Breath sounds: No wheezing or rales.     Comments: Diminished BS on right Abdominal:     General: There is no distension.     Palpations: Abdomen is soft.  Musculoskeletal:        General: No swelling.     Cervical back: Neck supple.  Lymphadenopathy:     Cervical: No cervical adenopathy.  Skin:    General: Skin is warm and dry.  Neurological:     General: No focal deficit present.     Mental Status: He is oriented to person, place, and time.     Cranial Nerves: No cranial nerve deficit.     Motor: No weakness.     Assessment/Plan: 51 year old man with a 60 pack year history of tobacco abuse, newly diagnosed bullous emphysema,  Barrett's esophagus, recently diagnosed adenocarcinoma of the esophagus, hypertension and hyperlipidemia presents with right sided CP and shortness of breath.  He has been found to have a large spontaneous right pneumothorax.  He is having a pleural catheter placed currently.  Right pneumothorax.  Agree with chest tube for immediate relief of symptoms and to prevent decompensation.  However, given the appearance of the lung on CT with multiple large blebs, I  do not think conservative management is appropriate and he should have more definitive management of the blebs.  I recommended to him that we proceed with a right VATS for stapling of blebs tomorrow AM.  Since he may be undergoing an esophagectomy in the near future, I recommended against pleural stripping or abrasion which would normally be a part of the procedure.  That will increase his chance for recurrence but I am afraid pleural symphysis would create significant issues with regards to future esophageal surgery.  I informed him of the general nature of the procedure, including the need for general anesthesia, the incisions to be used, the need for a drainage tube postoperatively, as well the possibility of recurrence.  I informed him of the indications, risks, benefits and alternatives. He understands the risks include, but are not limited to death, MI, DVT, PE, bleeding, possible need for transfusion, infection, air leaks, other organ system dysfunction such as respiratory or renal failure as well as the possibility of other unforeseeable complications.  He accepts the risks and agrees to proceed.  Revonda Standard Roxan Hockey, MD [facility] [phone]   [name] 04/28/2022, 5:05 PM

## 2022-04-28 NOTE — Consult Note (Signed)
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  Recently diagnosed esophageal cancer stage I and he interchanging the concepts of esophagectomy versus radiation treatment this time.  He notes he was increasingly short of breath yesterday which was 04/27/2022.  He was seen at urgent care was noted to have a pneumothorax.  He presented to the [facility] emergency room today with improvement in his respiratory status checks x-ray was showing 15% right pneumothorax.  Due to his history of severe bullous emphysema we will repeat CT noncontrast to make sure this is a true pneumothorax versus a ruptured bullae.  He reports breathing better, he is in no acute distress at this time.  Pertinent  Medical History   Past Medical History:  Diagnosis Date   Abscess of right thigh 06/16/2015   Barrett esophagus    Bronchitis    Complication of anesthesia    hard to wake up after hernia surgery   Ear infection 12/13/2017   taking amoxicillin   Family history of adverse reaction to anesthesia    mom hard to wake up    GERD (gastroesophageal reflux disease)    HLD (hyperlipidemia)    Hydradenitis    Left leg   Hypertension    Intervertebral disc disorder    Myalgia    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/07/2015   Suppurative hidradenitis    Umbilical hernia without obstruction and without gangrene 11/22/2017     Significant Hospital Events: Including procedures, antibiotic start and stop dates in addition to other pertinent events   04/28/2022 noncontrasted CT of the chest  Interim History / Subjective:  51 year old male with recent diagnosis esophageal cancer has severe bullous emphysema on the CT scan done earlier this month.  There  is concern he may have a ruptured bullae rather than pneumothorax.  Objective   Blood pressure 138/84, pulse 84, temperature 97.6 F (36.4 C), temperature source Oral, resp. rate 18, SpO2 96 %.       No intake or output data in the 24 hours ending 04/28/22 1156 There were no vitals filed for this visit.  Examination: General: well nourished HENT: no jvd Lungs: decreased air movement. Dull percussion throughout Cardiovascular: Heart sounds irregular regular rate rhythm Abdomen: Soft nontender positive bowel sounds Extremities: Warm dry without edema Neuro: Grossly intact without focal defect voids GU: voids  Resolved Hospital Problem list     Assessment & Plan:  New onset of shortness of breath with chest x-ray concerning for right pneumothorax recent CT shows severe bullous emphysema with multiple bullae and chest x-ray may represent ruptured bullae or pneumothoraces.  Not currently in respiratory distress.  Note he quit smoking 24 hours ago. Reorder CT of the chest to confirm pneumothorax O2 as needed Questionable chest tube in the near future  Recent diagnosis of esophageal cancer stage I questionable 6 salpingectomy versus radiation treatment he is making that decision at this time. Per oncology  Best Practice (right click and "Reselect all SmartList Selections" daily)   Diet/type: NPO DVT prophylaxis: not indicated GI prophylaxis: PPI Lines: N/A Foley:  N/A Code Status:  full code Last date of multidisciplinary goals of care discussion [tbd]  Labs   CBC: No results for input(s): "WBC", "NEUTROABS", "HGB", "HCT", "MCV", "PLT"  in the last 168 hours.  Basic Metabolic Panel: No results for input(s): "NA", "K", "CL", "CO2", "GLUCOSE", "BUN", "CREATININE", "CALCIUM", "MG", "PHOS" in the last 168 hours. GFR: CrCl cannot be calculated (Patient's most recent lab result is older than the maximum 21 days allowed.). No results for input(s): "PROCALCITON", "WBC",  "LATICACIDVEN" in the last 168 hours.  Liver Function Tests: No results for input(s): "AST", "ALT", "ALKPHOS", "BILITOT", "PROT", "ALBUMIN" in the last 168 hours. No results for input(s): "LIPASE", "AMYLASE" in the last 168 hours. No results for input(s): "AMMONIA" in the last 168 hours.  ABG No results found for: "PHART", "PCO2ART", "PO2ART", "HCO3", "TCO2", "ACIDBASEDEF", "O2SAT"   Coagulation Profile: No results for input(s): "INR", "PROTIME" in the last 168 hours.  Cardiac Enzymes: No results for input(s): "CKTOTAL", "CKMB", "CKMBINDEX", "TROPONINI" in the last 168 hours.  HbA1C: Hgb A1c MFr Bld  Date/Time Value Ref Range Status  05/01/2020 08:54 AM 5.3 4.8 - 5.6 % Final    Comment:    (NOTE) Pre diabetes:          5.7%-6.4%  Diabetes:              >6.4%  Glycemic control for   <7.0% adults with diabetes     CBG: No results for input(s): "GLUCAP" in the last 168 hours.  Review of Systems:   10 point review of system taken, please see HPI for positives and negatives. Notable for 24 hours of shortness of breath that is improved Chest tightness under the right rib Improved breathing over the last 24 hours Quit smoking    Past Medical History:  He,  has a past medical history of Abscess of right thigh (06/16/2015), Barrett esophagus, Bronchitis, Complication of anesthesia, Ear infection (12/13/2017), Family history of adverse reaction to anesthesia, GERD (gastroesophageal reflux disease), HLD (hyperlipidemia), Hydradenitis, Hypertension, Intervertebral disc disorder, Myalgia, Suppurative hidradenitis (08/07/2015), Suppurative hidradenitis, and Umbilical hernia without obstruction and without gangrene (11/22/2017).   Surgical History:   Past Surgical History:  Procedure Laterality Date   COLONOSCOPY WITH PROPOFOL N/A 03/06/2018   Procedure: COLONOSCOPY WITH PROPOFOL;  Surgeon: Lin Landsman, MD;  Location: [facility] ENDOSCOPY;  Service: Gastroenterology;   Laterality: N/A;   COLONOSCOPY WITH PROPOFOL N/A 12/16/2021   Procedure: COLONOSCOPY WITH PROPOFOL;  Surgeon: Robert Bellow, MD;  Location: [facility] ENDOSCOPY;  Service: Endoscopy;  Laterality: N/A;   ESOPHAGOGASTRODUODENOSCOPY (EGD) WITH PROPOFOL N/A 03/06/2018   Procedure: ESOPHAGOGASTRODUODENOSCOPY (EGD) WITH PROPOFOL;  Surgeon: Lin Landsman, MD;  Location: [facility] ENDOSCOPY;  Service: Gastroenterology;  Laterality: N/A;   ESOPHAGOGASTRODUODENOSCOPY (EGD) WITH PROPOFOL N/A 12/16/2021   Procedure: ESOPHAGOGASTRODUODENOSCOPY (EGD) WITH PROPOFOL;  Surgeon: Robert Bellow, MD;  Location: [facility] ENDOSCOPY;  Service: Endoscopy;  Laterality: N/A;   HERNIA REPAIR     INCISION AND DRAINAGE ABSCESS N/A 05/02/2020   Procedure: INCISION AND DRAINAGE ABSCESS;  Surgeon: Robert Bellow, MD;  Location: [facility] ORS;  Service: General;  Laterality: N/A;   INCISION AND DRAINAGE PERIRECTAL ABSCESS Right 06/16/2015   Procedure: IRRIGATION AND DEBRIDEMENT right inner thigh ABSCESS;  Surgeon: Florene Glen, MD;  Location: [facility] ORS;  Service: General;  Laterality: Right;   INSERTION OF MESH N/A 12/20/2017   Procedure: INSERTION OF MESH;  Surgeon: [name], MD;  Location: [facility] ORS;  Service: General;  Laterality: N/A;   KNEE ARTHROSCOPY WITH ANTERIOR CRUCIATE LIGAMENT (ACL) REPAIR Left 2002   KNEE SURGERY Left    PILONIDAL CYST EXCISION N/A 05/02/2020   Procedure: CYST EXCISION PILONIDAL EXTENSIVE;  Surgeon: Robert Bellow, MD;  Location: [facility] ORS;  Service: General;  Laterality: N/A;   UMBILICAL HERNIA REPAIR N/A 12/20/2017   Procedure: HERNIA REPAIR UMBILICAL ADULT;  Surgeon: [name], MD;  Location: [facility] ORS;  Service: General;  Laterality: N/A;     Social History:   reports that he has been smoking cigarettes. He has a 60.00 pack-year smoking history. He has never used smokeless tobacco. He reports that he does not currently use alcohol. He reports that he does not use drugs.    Family History:  His family history includes Cancer (age of onset: 66) in his paternal grandfather; Cancer (age of onset: 31) in his father; Heart disease (age of onset: 68) in his father.   Allergies No Known Allergies   Home Medications  Prior to Admission medications   Medication Sig Start Date End Date Taking? Authorizing Provider  baclofen (LIORESAL) 10 MG tablet Take 1 tablet (10 mg total) by mouth 2 (two) times daily. 11/15/21   Hazel Sams, PA-C  ezetimibe (ZETIA) 10 MG tablet Take 1 tablet (10 mg total) by mouth daily. 11/13/21   Crecencio Mc, MD  gabapentin (NEURONTIN) 300 MG capsule Take by mouth. 11/27/21   [provider]  loperamide (IMODIUM) 2 MG capsule Take 4 mg by mouth as needed for diarrhea or loose stools ([facility]).    [provider]  omeprazole (PRILOSEC) 40 MG capsule Take 1 capsule (40 mg total) by mouth daily. 11/13/21   Crecencio Mc, MD  potassium chloride 20 MEQ/15ML (10%) SOLN Take 15 mLs (20 mEq total) by mouth daily for 4 days. 12/10/21 12/14/21  Crecencio Mc, MD  predniSONE (DELTASONE) 10 MG tablet Six tablets today, decrease by one tablet daily. (4,6,6,5,9,9) 12/16/21   Byrnett, [name], MD     Critical care time: Ferol Luz Loura Pitt ACNP Acute Care Nurse Practitioner [place] Please consult Amion 04/28/2022, 11:57 AM

## 2022-04-28 NOTE — ED Provider Notes (Signed)
[facility] EMERGENCY DEPARTMENT Provider Note   CSN: [id number] Arrival date & time: 04/28/22  1035     History  Chief Complaint  Patient presents with   Spontaneous Pneumothorax    Bruce Graham is a 51 y.o. male.  51 year old male who presents with acute shortness of breath which began yesterday after he was coughing.  Patient does have a history of eczema.  Was seen in urgent care and x-ray of the showed clear percent pneumothorax on the right side.  Sent here for further management       Home Medications Prior to Admission medications   Medication Sig Start Date End Date Taking? Authorizing Provider  baclofen (LIORESAL) 10 MG tablet Take 1 tablet (10 mg total) by mouth 2 (two) times daily. 11/15/21   Hazel Sams, PA-C  ezetimibe (ZETIA) 10 MG tablet Take 1 tablet (10 mg total) by mouth daily. 11/13/21   Crecencio Mc, MD  gabapentin (NEURONTIN) 300 MG capsule Take by mouth. 11/27/21   [provider]  loperamide (IMODIUM) 2 MG capsule Take 4 mg by mouth as needed for diarrhea or loose stools (OVC).    [provider]  omeprazole (PRILOSEC) 40 MG capsule Take 1 capsule (40 mg total) by mouth daily. 11/13/21   Crecencio Mc, MD  potassium chloride 20 MEQ/15ML (10%) SOLN Take 15 mLs (20 mEq total) by mouth daily for 4 days. 12/10/21 12/14/21  Crecencio Mc, MD  predniSONE (DELTASONE) 10 MG tablet Six tablets today, decrease by one tablet daily. (3,3,5,4,5,6) 12/16/21   Byrnett, [name], MD      Allergies    Patient has no known allergies.    Review of Systems   Review of Systems  All other systems reviewed and are negative.   Physical Exam Updated Vital Signs BP (!) 182/106   Pulse 82   Temp 97.6 F (36.4 C) (Oral)   Resp (!) 24   SpO2 94%  Physical Exam Vitals and nursing note reviewed.  Constitutional:      General: He is not in acute distress.    Appearance: Normal appearance. He is well-developed. He is not  toxic-appearing.  HENT:     Head: Normocephalic and atraumatic.  Eyes:     General: Lids are normal.     Conjunctiva/sclera: Conjunctivae normal.     Pupils: Pupils are equal, round, and reactive to light.  Neck:     Thyroid: No thyroid mass.     Trachea: No tracheal deviation.  Cardiovascular:     Rate and Rhythm: Normal rate and regular rhythm.     Heart sounds: Normal heart sounds. No murmur heard.    No gallop.  Pulmonary:     Effort: Pulmonary effort is normal. No respiratory distress.     Breath sounds: No stridor. Examination of the right-middle field reveals decreased breath sounds. Examination of the right-lower field reveals decreased breath sounds. Decreased breath sounds present. No wheezing, rhonchi or rales.  Abdominal:     General: There is no distension.     Palpations: Abdomen is soft.     Tenderness: There is no abdominal tenderness. There is no rebound.  Musculoskeletal:        General: No tenderness. Normal range of motion.     Cervical back: Normal range of motion and neck supple.  Skin:    General: Skin is warm and dry.     Findings: No abrasion or rash.  Neurological:  Mental Status: He is alert and oriented to person, place, and time. Mental status is at baseline.     GCS: GCS eye subscore is 4. GCS verbal subscore is 5. GCS motor subscore is 6.     Cranial Nerves: No cranial nerve deficit.     Sensory: No sensory deficit.     Motor: Motor function is intact.  Psychiatric:        Attention and Perception: Attention normal.        Speech: Speech normal.        Behavior: Behavior normal.     ED Results / Procedures / Treatments   Labs (all labs ordered are listed, but only abnormal results are displayed) Labs Reviewed - No data to display  EKG None  Radiology DG Chest 2 View  Result Date: 04/28/2022 CLINICAL DATA:  Shortness of breath EXAM: CHEST - 2 VIEW COMPARISON:  05/24/2021 FINDINGS: Moderate right pneumothorax seen laterally and  apically, at least 20%. Chronic interstitial coarsening with apical emphysema and bullae. Normal heart size. Aortic tortuosity. These results were called by telephone at the time of interpretation on 04/28/2022 at 10:00 am to provider [facility] , who verbally acknowledged these results. IMPRESSION: 1. Moderate right pneumothorax, at least 20%. 2. Emphysema with apical blebs. Electronically Signed   By: Jorje Guild M.D.   On: 04/28/2022 10:23    Procedures Procedures    Medications Ordered in ED Medications - No data to display  ED Course/ Medical Decision Making/ A&P                           Medical Decision Making Amount and/or Complexity of Data Reviewed Labs: ordered.   Patient's chest x-ray per my interpretation shows moderate right-sided pneumothorax.  This is confirmed by a chest CT which was ordered after patient seen by pulmonary.  They are trying to decide whether patient will have a pigtail catheter versus chest tube versus seen by cardiothoracic surgery.  They request that patient be admitted to the hospitalist.  I spoke with them and they will admit the patient        Final Clinical Impression(s) / ED Diagnoses Final diagnoses:  None    Rx / DC Orders ED Discharge Orders     None         Lacretia Leigh, MD 04/28/22 1549

## 2022-04-28 NOTE — ED Notes (Signed)
Patient is being discharged from the Urgent Care and sent to the Emergency Department via POV. Per Mare Ferrari, NP, patient is in need of higher level of care due to pneumothorax. Patient is aware and verbalizes understanding of plan of care.  Vitals:   04/28/22 0835 04/28/22 0838  BP: (!) 161/113 (!) 162/105  Pulse: 74   Resp: 19   Temp: 97.7 F (36.5 C)   SpO2: 97%

## 2022-04-28 NOTE — Progress Notes (Signed)
Received patient from ED, alert and oriented. Chest tube in right chest intact, connected to wall suction, no output yet, no air leak. Pain 8/10, PRN med given. Can walk with 1 assist.

## 2022-04-28 NOTE — Anesthesia Preprocedure Evaluation (Signed)
Anesthesia Evaluation  Patient identified by MRN, date of birth, ID band Patient awake    Reviewed: Allergy & Precautions, H&P , NPO status , Patient's Chart, lab work & pertinent test results  History of Anesthesia Complications (+) Family history of anesthesia reaction and history of anesthetic complications  Airway Mallampati: III  TM Distance: >3 FB Neck ROM: Full    Dental  (+) Poor Dentition, Dental Advisory Given   Pulmonary COPD, Current Smoker and Patient abstained from smoking.   Pulmonary exam normal breath sounds clear to auscultation       Cardiovascular Exercise Tolerance: Good hypertension, On Medications + CAD  Normal cardiovascular exam Rhythm:Regular Rate:Normal  Echo 02/2021 1. Left ventricular ejection fraction by 3D volume is 55 %. The left ventricle has normal function. The left ventricle has no regional wall motion abnormalities. The left ventricular internal cavity size was moderately dilated. Left ventricular diastolic parameters are consistent with Grade I diastolic dysfunction (impaired relaxation).  2. Right ventricular systolic function is normal. The right ventricular size is normal.  3. The mitral valve is normal in structure. No evidence of mitral valve regurgitation. No evidence of mitral stenosis.  4. The aortic valve is tricuspid. Aortic valve regurgitation is not visualized. Mild to moderate aortic valve sclerosis/calcification is present, without any evidence of aortic stenosis.  5. The inferior vena cava is normal in size with greater than 50% respiratory variability, suggesting right atrial pressure of 3 mmHg.    Neuro/Psych negative neurological ROS  negative psych ROS   GI/Hepatic Neg liver ROS,GERD  Medicated,,  Endo/Other  negative endocrine ROS    Renal/GU negative Renal ROS     Musculoskeletal   Abdominal  (+) + obese  Peds  Hematology negative hematology ROS (+)    Anesthesia Other Findings \   Reproductive/Obstetrics negative OB ROS                             Anesthesia Physical Anesthesia Plan  ASA: 3  Anesthesia Plan: General   Post-op Pain Management: Ofirmev IV (intra-op)*   Induction: Intravenous  PONV Risk Score and Plan: 2 and Ondansetron, Dexamethasone, Treatment may vary due to age or medical condition and Midazolam  Airway Management Planned: Double Lumen EBT and Video Laryngoscope Planned  Additional Equipment: Arterial line  Intra-op Plan:   Post-operative Plan: Extubation in OR  Informed Consent: I have reviewed the patients History and Physical, chart, labs and discussed the procedure including the risks, benefits and alternatives for the proposed anesthesia with the patient or authorized representative who has indicated his/her understanding and acceptance.     Dental advisory given  Plan Discussed with: CRNA  Anesthesia Plan Comments: (2 x PIV )        Anesthesia Quick Evaluation

## 2022-04-28 NOTE — H&P (Signed)
History and Physical    Bruce Graham XBM:[id number] DOB: Apr 02, 1971 DOA: 04/28/2022  PCP: Crecencio Mc, MD Patient coming from: home  Chief Complaint: Cough, shortness of breath and pleuritic chest discomfort.  HPI: Bruce Graham is a 51 y.o. male with medical history significant of probable COPD not on home O2, tobacco abuse, GERD, HTN, HLD, who presented with Cough, shortness of breath and pleuritic chest discomfort.  Patient reports tobacco abuse 1 pack a day, and he has been cutting off to 5 cigarettes a day now.  Patient reports chronic nonproductive cough from tobacco abuse.  He states that he had sudden onset shortness of breath and pleuritic chest pain after his chronic cough yesterday.  He did not seek medical attention.  This morning when he woke up, he felt similar shortness of breath and a pleuritic chest pain.  Patient went to a local urgent care and was found to have pneumothorax.  He was asked to come to [facility] for further evaluation.  Denies sick contact, fevers, chills, wheezing, nausea, vomiting, diarrhea, abdominal pain, dysuria, urinary frequency or urgency.  In the emergency room, he was afebrile with a pulse 82, RR 22, BP 180/102 and room air O2 sats 94 to 98%.  The labs showed WBC 11.8, nonrevealing BMP. CXR showed Emphysema with apical blebs.  Moderate right pneumothorax.  Chest CT showed moderate to large right pneumothorax.  Pulmonologist was consulted  Review of Systems: As per HPI otherwise 10 point review of systems negative.  Review of Systems Otherwise negative except as per HPI, including: General: Denies fever, chills, night sweats or unintended weight loss. Resp: Positive for cough and shortness of breath Cardiac: Denies palpitations, orthopnea, paroxysmal nocturnal dyspnea.Marland Kitchen  Positive for pleuritic chest pain. GI: Denies abdominal pain, nausea, vomiting, diarrhea or constipation GU: Denies dysuria, frequency, hesitancy or incontinence MS: Denies  muscle aches, joint pain or swelling Neuro: Denies headache, neurologic deficits (focal weakness, numbness, tingling), abnormal gait Psych: Denies anxiety, depression, SI/HI/AVH Skin: Denies new rashes or lesions ID: Denies sick contacts, exotic exposures, travel  Past Medical History:  Diagnosis Date   Abscess of right thigh 06/16/2015   Barrett esophagus    Bronchitis    Complication of anesthesia    hard to wake up after hernia surgery   Ear infection 12/13/2017   taking amoxicillin   Family history of adverse reaction to anesthesia    mom hard to wake up    GERD (gastroesophageal reflux disease)    HLD (hyperlipidemia)    Hydradenitis    Left leg   Hypertension    Intervertebral disc disorder    Myalgia    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/07/2015   Suppurative hidradenitis    Umbilical hernia without obstruction and without gangrene 11/22/2017    Past Surgical History:  Procedure Laterality Date   COLONOSCOPY WITH PROPOFOL N/A 03/06/2018   Procedure: COLONOSCOPY WITH PROPOFOL;  Surgeon: Lin Landsman, MD;  Location: [facility] ENDOSCOPY;  Service: Gastroenterology;  Laterality: N/A;   COLONOSCOPY WITH PROPOFOL N/A 12/16/2021   Procedure: COLONOSCOPY WITH PROPOFOL;  Surgeon: Robert Bellow, MD;  Location: [facility] ENDOSCOPY;  Service: Endoscopy;  Laterality: N/A;   ESOPHAGOGASTRODUODENOSCOPY (EGD) WITH PROPOFOL N/A 03/06/2018   Procedure: ESOPHAGOGASTRODUODENOSCOPY (EGD) WITH PROPOFOL;  Surgeon: Lin Landsman, MD;  Location: [facility] ENDOSCOPY;  Service: Gastroenterology;  Laterality: N/A;   ESOPHAGOGASTRODUODENOSCOPY (EGD) WITH PROPOFOL N/A 12/16/2021   Procedure: ESOPHAGOGASTRODUODENOSCOPY (EGD) WITH PROPOFOL;  Surgeon: Robert Bellow, MD;  Location: [facility] ENDOSCOPY;  Service: Endoscopy;  Laterality: N/A;   HERNIA REPAIR     INCISION AND DRAINAGE ABSCESS N/A 05/02/2020   Procedure: INCISION AND DRAINAGE ABSCESS;  Surgeon: Robert Bellow, MD;  Location: [facility] ORS;   Service: General;  Laterality: N/A;   INCISION AND DRAINAGE PERIRECTAL ABSCESS Right 06/16/2015   Procedure: IRRIGATION AND DEBRIDEMENT right inner thigh ABSCESS;  Surgeon: Florene Glen, MD;  Location: [facility] ORS;  Service: General;  Laterality: Right;   INSERTION OF MESH N/A 12/20/2017   Procedure: INSERTION OF MESH;  Surgeon: [name], MD;  Location: [facility] ORS;  Service: General;  Laterality: N/A;   KNEE ARTHROSCOPY WITH ANTERIOR CRUCIATE LIGAMENT (ACL) REPAIR Left 2002   KNEE SURGERY Left    PILONIDAL CYST EXCISION N/A 05/02/2020   Procedure: CYST EXCISION PILONIDAL EXTENSIVE;  Surgeon: Robert Bellow, MD;  Location: [facility] ORS;  Service: General;  Laterality: N/A;   UMBILICAL HERNIA REPAIR N/A 12/20/2017   Procedure: HERNIA REPAIR UMBILICAL ADULT;  Surgeon: [name], MD;  Location: [facility] ORS;  Service: General;  Laterality: N/A;    SOCIAL HISTORY:  reports that he has been smoking cigarettes. He has a 60.00 pack-year smoking history. He has never used smokeless tobacco. He reports that he does not currently use alcohol. He reports that he does not use drugs.  No Known Allergies  FAMILY HISTORY: Family History  Problem Relation Age of Onset   Heart disease Father 15   Cancer Father 43       Lung Cancer   Cancer Paternal Grandfather 61       in the bones     Prior to Admission medications   Medication Sig Start Date End Date Taking? Authorizing Provider  Fexofenadine HCl (MUCINEX ALLERGY PO) Take 1 tablet by mouth every 8 (eight) hours as needed (cough).   Yes [provider]  baclofen (LIORESAL) 10 MG tablet Take 1 tablet (10 mg total) by mouth 2 (two) times daily. Patient not taking: Reported on 04/28/2022 11/15/21   Hazel Sams, PA-C  ezetimibe (ZETIA) 10 MG tablet Take 1 tablet (10 mg total) by mouth daily. Patient not taking: Reported on 04/28/2022 11/13/21   Crecencio Mc, MD  gabapentin (NEURONTIN) 300 MG capsule Take by mouth. Patient not  taking: Reported on 04/28/2022 11/27/21   [provider]  mesalamine (LIALDA) 1.2 g EC tablet Take 4.8 g by mouth daily. Patient not taking: Reported on 04/28/2022 03/03/22   [provider]  omeprazole (PRILOSEC) 40 MG capsule Take 1 capsule (40 mg total) by mouth daily. Patient not taking: Reported on 04/28/2022 11/13/21   Crecencio Mc, MD  potassium chloride 20 MEQ/15ML (10%) SOLN Take 15 mLs (20 mEq total) by mouth daily for 4 days. Patient not taking: Reported on 04/28/2022 12/10/21 12/14/21  Crecencio Mc, MD  predniSONE (DELTASONE) 10 MG tablet Six tablets today, decrease by one tablet daily. [phone]) Patient not taking: Reported on 04/28/2022 12/16/21   Robert Bellow, MD    Physical Exam: Vitals:   04/28/22 1330 04/28/22 1345 04/28/22 1445 04/28/22 1456  BP: (!) 160/97 (!) 158/89 (!) 148/92   Pulse: 84 70 (!) 119   Resp: (!) 23 18 (!) 21   Temp:    97.8 F (36.6 C)  TempSrc:    Oral  SpO2: 96% 94% 98%       Constitutional: NAD, calm, comfortable.  Acute ill-appearing. Eyes: PERRL, lids and conjunctivae normal ENMT: Mucous membranes are moist. Posterior pharynx clear of any  exudate or lesions.Normal dentition.  Neck: normal, supple, no masses, no thyromegaly Respiratory: Right lung sounds diminished.  no wheezing, no crackles. Normal respiratory effort. No accessory muscle use.  Cardiovascular: Regular rate and rhythm, no murmurs / rubs / gallops. No extremity edema. 2+ pedal pulses. No carotid bruits.  Abdomen: no tenderness, no masses palpated. No hepatosplenomegaly. Bowel sounds positive.  Musculoskeletal: no clubbing / cyanosis. No joint deformity upper and lower extremities. Good ROM, no contractures. Normal muscle tone.  Skin: no rashes, lesions, ulcers. No induration Neurologic: CN 2-12 grossly intact. Sensation intact, DTR normal. Strength 5/5 in all 4.  Psychiatric: Normal judgment and insight. Alert and oriented x 3. Normal mood.     Labs  on Admission: I have personally reviewed following labs and imaging studies  CBC: Recent Labs  Lab 04/28/22 1155  WBC 11.8*  NEUTROABS 7.5  HGB 14.7  HCT 44.1  MCV 80.8  PLT 387*   Basic Metabolic Panel: Recent Labs  Lab 04/28/22 1155  Brynnlie Unterreiner 139  K 3.8  CL 105  CO2 24  GLUCOSE 109*  BUN 5*  CREATININE 0.99  CALCIUM 9.1   GFR: Estimated Creatinine Clearance: 106.3 mL/min (by C-G formula based on SCr of 0.99 mg/dL). Liver Function Tests: No results for input(s): "AST", "ALT", "ALKPHOS", "BILITOT", "PROT", "ALBUMIN" in the last 168 hours. No results for input(s): "LIPASE", "AMYLASE" in the last 168 hours. No results for input(s): "AMMONIA" in the last 168 hours. Coagulation Profile: No results for input(s): "INR", "PROTIME" in the last 168 hours. Cardiac Enzymes: No results for input(s): "CKTOTAL", "CKMB", "CKMBINDEX", "TROPONINI" in the last 168 hours. BNP (last 3 results) No results for input(s): "PROBNP" in the last 8760 hours. HbA1C: No results for input(s): "HGBA1C" in the last 72 hours. CBG: No results for input(s): "GLUCAP" in the last 168 hours. Lipid Profile: No results for input(s): "CHOL", "HDL", "LDLCALC", "TRIG", "CHOLHDL", "LDLDIRECT" in the last 72 hours. Thyroid Function Tests: No results for input(s): "TSH", "T4TOTAL", "FREET4", "T3FREE", "THYROIDAB" in the last 72 hours. Anemia Panel: No results for input(s): "VITAMINB12", "FOLATE", "FERRITIN", "TIBC", "IRON", "RETICCTPCT" in the last 72 hours. Urine analysis:    Component Value Date/Time   COLORURINE YELLOW 03/25/2020 1757   APPEARANCEUR Clear 02/06/2021 1654   LABSPEC 1.015 03/25/2020 1757   PHURINE 7.0 03/25/2020 1757   GLUCOSEU Negative 02/06/2021 1654   HGBUR TRACE (A) 03/25/2020 1757   BILIRUBINUR Negative 02/06/2021 1654   KETONESUR NEGATIVE 03/25/2020 1757   PROTEINUR Negative 02/06/2021 1654   PROTEINUR NEGATIVE 03/25/2020 1757   NITRITE Negative 02/06/2021 1654   NITRITE NEGATIVE  03/25/2020 1757   LEUKOCYTESUR Negative 02/06/2021 1654   LEUKOCYTESUR NEGATIVE 03/25/2020 1757   Sepsis Labs: !!!!!!!!!!!!!!!!!!!!!!!!!!!!!!!!!!!!!!!!!!!! _0 (procalcitonin:4,lacticidven:4) )No results found for this or any previous visit (from the past 240 hour(s)).   Radiological Exams on Admission: DG Chest Port 1 View  Result Date: 04/28/2022 CLINICAL DATA:  Chest tube placement, post tube placement for pneumothorax. EXAM: PORTABLE CHEST 1 VIEW COMPARISON:  Chest CT April 28, 2022. FINDINGS: Post placement of RIGHT-sided chest tube. Tube coiled in the RIGHT upper chest Re-expansion of the RIGHT lung. Perhaps very small pneumothorax at the RIGHT lung apex. Presence of blebs in this region makes assessment difficult. Lateral and basilar component has resolved. Perhaps minimal volume loss along the minor fissure in the RIGHT chest persists. Bullous disease in the LEFT chest at the LEFT lung apex as well. LEFT chest is clear otherwise. On limited assessment there is no acute skeletal  process. IMPRESSION: 1. Post placement of RIGHT-sided chest tube with reexpansion of the RIGHT lung as described above. Perhaps very small pneumothorax at the RIGHT lung apex. 2. Bullous disease in the LEFT lung apex. Electronically Signed   By: Zetta Bills M.D.   On: 04/28/2022 17:41   CT CHEST WO CONTRAST  Result Date: 04/28/2022 CLINICAL DATA:  Pneumothorax EXAM: CT CHEST WITHOUT CONTRAST TECHNIQUE: Multidetector CT imaging of the chest was performed following the standard protocol without IV contrast. RADIATION DOSE REDUCTION: This exam was performed according to the departmental dose-optimization program which includes automated exposure control, adjustment of the mA and/or kV according to patient size and/or use of iterative reconstruction technique. COMPARISON:  Same day chest radiograph. FINDINGS: Cardiovascular: Coronary artery atherosclerosis. Normal heart size. No pericardial effusion.  Mediastinum/Nodes: No enlarged mediastinal or axillary lymph nodes. Thyroid gland, trachea, and esophagus demonstrate no significant findings. Lungs/Pleura: Moderate emphysema with apical blebs. Moderate to large right pneumothorax. Scarring/atelectasis in the right upper lobe. No consolidation. No sizable pleural effusions. Upper Abdomen: No acute findings. Small gallstones, partially imaged. Diverticulosis. Musculoskeletal: No fracture is seen. IMPRESSION: 1. Moderate to large right pneumothorax. 2. Scarring/atelectasis in the right upper lobe. 3. Emphysema (ICD10-J43.9) with apical blebs. Electronically Signed   By: Margaretha Sheffield M.D.   On: 04/28/2022 12:57   DG Chest 2 View  Result Date: 04/28/2022 CLINICAL DATA:  Shortness of breath EXAM: CHEST - 2 VIEW COMPARISON:  05/24/2021 FINDINGS: Moderate right pneumothorax seen laterally and apically, at least 20%. Chronic interstitial coarsening with apical emphysema and bullae. Normal heart size. Aortic tortuosity. These results were called by telephone at the time of interpretation on 04/28/2022 at 10:00 am to provider [facility] , who verbally acknowledged these results. IMPRESSION: 1. Moderate right pneumothorax, at least 20%. 2. Emphysema with apical blebs. Electronically Signed   By: Jorje Guild M.D.   On: 04/28/2022 10:23     All images have been reviewed by me personally.  EKG: Independently reviewed.   Assessment/Plan Principal Problem:   Pneumothorax Active Problems:   Essential hypertension   Tobacco abuse   History of leukocytosis   Esophageal cancer (HCC)   Leukocytosis   COPD with acute exacerbation (HCC)     Assessment and Plan: Assessment Plan  #Moderate to large right-sided pleural pneumothorax #COPD #Tobacco abuse  -Pulmonologist consulted for chest tube insertion -Defer chest tube management to pulmonologist -Tobacco cessation education done at bedside -Declined nicotine patch -Does not require oxygen  supplement   #HTN and HLD -Chronic and stable  #Mild leukocytosis -Reactive -No evidence of infection  # Esophageal cancer, cT1N0M0, stage MMR normal  # h/o Barrett's esophagus.  - FU by oncology as out      DVT prophylaxis: SCD Code Status: Full code Family Communication: N/A Consults called: Pulmonologist Admission status: Observation  Status is: Observation The patient remains OBS appropriate and will d/c before 2 midnights.   Time Spent: 65 minutes.  >50% of the time was devoted to discussing the patients care, assessment, plan and disposition with other care givers along with counseling the patient about the risks and benefits of treatment.    Charlann Lange MD [facility]  If 7PM-7AM, please contact night-coverage   04/28/2022, 6:21 PM

## 2022-04-28 NOTE — Progress Notes (Incomplete)
GI Location of Tumor / Histology: Esophagus Cancer  Bruce Graham has a history of Barrett's Esophagus and underwent screening EGD on 12/16/2021 that showed reflux esophagitis.  PET 01/28/2022: No evidence of FDG avid disease.    EUS 01/04/2022:   CT Chest 12/25/2021: Moderate wall thickening involving the distal esophagus near the GE junction but no paraesophageal or upper abdominal lymphadenopathy.  No findings for pulmonary metastatic disease.  Pathology Report:  Esophagus/Endomucosal resection 03/15/2022    Past/Anticipated interventions by surgeon, if any:  Dr. Elenor Quinones -Recommend Esophagectomy   Past/Anticipated interventions by medical oncology, if any:  Dr. Burr Medico 04/26/2022 -He was referred to Dr. Elenor Quinones and underwent endoscopic resection on 03/15/22.  - Esophagectomy was recommended by Dr. Elenor Quinones, however pt is very reluctant to have surgery.  -I discussed that surgical resection/esophagectomy is the standard therapy after endoscopy resection with positive margin for T1b disease and I strongly encourage him to consider it.  He is young and fit, would be a good candidate for surgery.  -I discussed the option of concurrent chemoradiation for 5.5  weeks if he still declined surgery.  I discussed the treatment course with him in detail.  He will think about it and agreed to see radiation oncologist Dr. Lisbeth Renshaw.  -Patient is most interested in immunotherapy.  I will request PD-L1 and HER2 on his tumor sample at [facility], which can predict his response to immunotherapy.  I discussed that immunotherapy is not approved for stage I esophageal cancer, but it does not have a role in adjuvant setting for early stage, and it is one of the therapy options for metastatic disease.  Due to his recently diagnosed ulcerative colitis, I am concerned potential worsening colitis when he on monotherapy.I do not think it's a good option for him, but will consider if he refuses all other therapy options.      Weight  changes, if any:   Bowel/Bladder complaints, if any:   Nausea / Vomiting, if any:   Pain issues, if any:        SAFETY ISSUES: Prior radiation?  Pacemaker/ICD?  Possible current pregnancy? N/a Is the patient on methotrexate?   Current Complaints/Details:

## 2022-04-28 NOTE — H&P (View-Only) (Signed)
Reason for Consult:spontaneous pneumothorax Referring Physician: Dr. Irine Seal Bruce Graham is an 51 y.o. male.  HPI: 51 yo man with history of tobacco abuse, reflux, Barrett's esophagus, recently diagnosed stage IB esophageal adenocarcinoma, hypertension, hyperlipidemia and hydradenitis.  He presented with right sided CP and shortness of breath.  Sudden onset yesterday after coughing.  He went to Urgent Care this morning with persistent shortness of breath and a CXR showed a right pneumothorax.  CT chest showed a large right pneumothorax with severe emphysema and multiple large blebs/ bullae.  Currently not in distress, but still symptomatic.  Dr. Erin Fulling preparing to place a pigtail catheter.  Smoked 2 ppd for 30 years.  No history of CAD, DVT, stroke, prior pneumothorax.  Was recently diagnosed with early stage esophageal cancer.  Had submucosal resection by Dr. Elenor Quinones at [facility]. Has not yet decided whether or not to have esophagectomy.  Past Medical History:  Diagnosis Date   Abscess of right thigh 06/16/2015   Barrett esophagus    Bronchitis    Complication of anesthesia    hard to wake up after hernia surgery   Ear infection 12/13/2017   taking amoxicillin   Family history of adverse reaction to anesthesia    mom hard to wake up    GERD (gastroesophageal reflux disease)    HLD (hyperlipidemia)    Hydradenitis    Left leg   Hypertension    Intervertebral disc disorder    Myalgia    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/07/2015   Suppurative hidradenitis    Umbilical hernia without obstruction and without gangrene 11/22/2017    Past Surgical History:  Procedure Laterality Date   COLONOSCOPY WITH PROPOFOL N/A 03/06/2018   Procedure: COLONOSCOPY WITH PROPOFOL;  Surgeon: Lin Landsman, MD;  Location: [facility] ENDOSCOPY;  Service: Gastroenterology;  Laterality: N/A;   COLONOSCOPY WITH PROPOFOL N/A 12/16/2021   Procedure: COLONOSCOPY WITH PROPOFOL;  Surgeon: Robert Bellow, MD;   Location: [facility] ENDOSCOPY;  Service: Endoscopy;  Laterality: N/A;   ESOPHAGOGASTRODUODENOSCOPY (EGD) WITH PROPOFOL N/A 03/06/2018   Procedure: ESOPHAGOGASTRODUODENOSCOPY (EGD) WITH PROPOFOL;  Surgeon: Lin Landsman, MD;  Location: [facility] ENDOSCOPY;  Service: Gastroenterology;  Laterality: N/A;   ESOPHAGOGASTRODUODENOSCOPY (EGD) WITH PROPOFOL N/A 12/16/2021   Procedure: ESOPHAGOGASTRODUODENOSCOPY (EGD) WITH PROPOFOL;  Surgeon: Robert Bellow, MD;  Location: [facility] ENDOSCOPY;  Service: Endoscopy;  Laterality: N/A;   HERNIA REPAIR     INCISION AND DRAINAGE ABSCESS N/A 05/02/2020   Procedure: INCISION AND DRAINAGE ABSCESS;  Surgeon: Robert Bellow, MD;  Location: [facility] ORS;  Service: General;  Laterality: N/A;   INCISION AND DRAINAGE PERIRECTAL ABSCESS Right 06/16/2015   Procedure: IRRIGATION AND DEBRIDEMENT right inner thigh ABSCESS;  Surgeon: Florene Glen, MD;  Location: [facility] ORS;  Service: General;  Laterality: Right;   INSERTION OF MESH N/A 12/20/2017   Procedure: INSERTION OF MESH;  Surgeon: [name], MD;  Location: [facility] ORS;  Service: General;  Laterality: N/A;   KNEE ARTHROSCOPY WITH ANTERIOR CRUCIATE LIGAMENT (ACL) REPAIR Left 2002   KNEE SURGERY Left    PILONIDAL CYST EXCISION N/A 05/02/2020   Procedure: CYST EXCISION PILONIDAL EXTENSIVE;  Surgeon: Robert Bellow, MD;  Location: [facility] ORS;  Service: General;  Laterality: N/A;   UMBILICAL HERNIA REPAIR N/A 12/20/2017   Procedure: HERNIA REPAIR UMBILICAL ADULT;  Surgeon: [name], MD;  Location: [facility] ORS;  Service: General;  Laterality: N/A;    Family History  Problem Relation Age of Onset   Heart disease Father 23  Cancer Father 29       Lung Cancer   Cancer Paternal Grandfather 2       in the bones    Social History:  reports that he has been smoking cigarettes. He has a 60.00 pack-year smoking history. He has never used smokeless tobacco. He reports that he does not currently use alcohol. He  reports that he does not use drugs.  Allergies: No Known Allergies  Medications: - Ezetimide Mesalamin Prilosec Prednisone Neurontin Mucinex Baclofen  Results for orders placed or performed during the hospital encounter of 04/28/22 (from the past 48 hour(s))  CBC with Differential/Platelet     Status: Abnormal   Collection Time: 04/28/22 11:55 AM  Result Value Ref Range   WBC 11.8 (H) 4.0 - 10.5 K/uL   RBC 5.46 4.22 - 5.81 MIL/uL   Hemoglobin 14.7 13.0 - 17.0 g/dL   HCT 44.1 39.0 - 52.0 %   MCV 80.8 80.0 - 100.0 fL   MCH 26.9 26.0 - 34.0 pg   MCHC 33.3 30.0 - 36.0 g/dL   RDW 16.9 (H) 11.5 - 15.5 %   Platelets 438 (H) 150 - 400 K/uL   nRBC 0.0 0.0 - 0.2 %   Neutrophils Relative % 64 %   Neutro Abs 7.5 1.7 - 7.7 K/uL   Lymphocytes Relative 28 %   Lymphs Abs 3.3 0.7 - 4.0 K/uL   Monocytes Relative 7 %   Monocytes Absolute 0.8 0.1 - 1.0 K/uL   Eosinophils Relative 1 %   Eosinophils Absolute 0.1 0.0 - 0.5 K/uL   Basophils Relative 0 %   Basophils Absolute 0.0 0.0 - 0.1 K/uL   Immature Granulocytes 0 %   Abs Immature Granulocytes 0.05 0.00 - 0.07 K/uL    Comment: Performed at [facility], [address] [place], [place] [address]  Basic metabolic panel     Status: Abnormal   Collection Time: 04/28/22 11:55 AM  Result Value Ref Range   Sodium 139 135 - 145 mmol/L   Potassium 3.8 3.5 - 5.1 mmol/L   Chloride 105 98 - 111 mmol/L   CO2 24 22 - 32 mmol/L   Glucose, Bld 109 (H) 70 - 99 mg/dL    Comment: Glucose reference range applies only to samples taken after fasting for at least 8 hours.   BUN 5 (L) 6 - 20 mg/dL   Creatinine, Ser 0.99 0.61 - 1.24 mg/dL   Calcium 9.1 8.9 - 10.3 mg/dL   GFR, Estimated >60 >60 mL/min    Comment: (NOTE) Calculated using the CKD-EPI Creatinine Equation (2021)    Anion gap 10 5 - 15    Comment: Performed at [facility] [address] [place], [place] [address]    CT CHEST WO CONTRAST  Result Date: 04/28/2022 CLINICAL  DATA:  Pneumothorax EXAM: CT CHEST WITHOUT CONTRAST TECHNIQUE: Multidetector CT imaging of the chest was performed following the standard protocol without IV contrast. RADIATION DOSE REDUCTION: This exam was performed according to the departmental dose-optimization program which includes automated exposure control, adjustment of the mA and/or kV according to patient size and/or use of iterative reconstruction technique. COMPARISON:  Same day chest radiograph. FINDINGS: Cardiovascular: Coronary artery atherosclerosis. Normal heart size. No pericardial effusion. Mediastinum/Nodes: No enlarged mediastinal or axillary lymph nodes. Thyroid gland, trachea, and esophagus demonstrate no significant findings. Lungs/Pleura: Moderate emphysema with apical blebs. Moderate to large right pneumothorax. Scarring/atelectasis in the right upper lobe. No consolidation. No sizable pleural effusions. Upper  Abdomen: No acute findings. Small gallstones, partially imaged. Diverticulosis. Musculoskeletal: No fracture is seen. IMPRESSION: 1. Moderate to large right pneumothorax. 2. Scarring/atelectasis in the right upper lobe. 3. Emphysema (ICD10-J43.9) with apical blebs. Electronically Signed   By: Margaretha Sheffield M.D.   On: 04/28/2022 12:57   DG Chest 2 View  Result Date: 04/28/2022 CLINICAL DATA:  Shortness of breath EXAM: CHEST - 2 VIEW COMPARISON:  05/24/2021 FINDINGS: Moderate right pneumothorax seen laterally and apically, at least 20%. Chronic interstitial coarsening with apical emphysema and bullae. Normal heart size. Aortic tortuosity. These results were called by telephone at the time of interpretation on 04/28/2022 at 10:00 am to provider [facility] , who verbally acknowledged these results. IMPRESSION: 1. Moderate right pneumothorax, at least 20%. 2. Emphysema with apical blebs. Electronically Signed   By: Jorje Guild M.D.   On: 04/28/2022 10:23    I personally reviewed the Ct images. There is a large right  pneumothorax.  Multiple large blebs, bullae in setting of emphysema.  Also blebs but no pneumothorax on left.  Review of Systems  Constitutional:  Negative for activity change and unexpected weight change.  HENT:  Negative for trouble swallowing and voice change.   Respiratory:  Positive for cough and shortness of breath.   Cardiovascular:  Positive for chest pain (see HPI).  Gastrointestinal:  Positive for abdominal pain (reflux) and diarrhea.  Genitourinary:  Negative for dysuria.  Musculoskeletal:  Positive for back pain.  Neurological:  Negative for syncope and weakness.  Hematological:  Negative for adenopathy. Does not bruise/bleed easily.  All other systems reviewed and are negative.  Blood pressure (!) 148/92, pulse (!) 119, temperature 97.8 F (36.6 C), temperature source Oral, resp. rate (!) 21, SpO2 98 %. Physical Exam Vitals reviewed.  Constitutional:      General: He is not in acute distress.    Appearance: He is obese.  HENT:     Head: Normocephalic and atraumatic.  Eyes:     Extraocular Movements: Extraocular movements intact.  Cardiovascular:     Rate and Rhythm: Normal rate. Rhythm irregular.     Heart sounds: Normal heart sounds. No murmur heard. Pulmonary:     Effort: Pulmonary effort is normal. No respiratory distress.     Breath sounds: No wheezing or rales.     Comments: Diminished BS on right Abdominal:     General: There is no distension.     Palpations: Abdomen is soft.  Musculoskeletal:        General: No swelling.     Cervical back: Neck supple.  Lymphadenopathy:     Cervical: No cervical adenopathy.  Skin:    General: Skin is warm and dry.  Neurological:     General: No focal deficit present.     Mental Status: He is oriented to person, place, and time.     Cranial Nerves: No cranial nerve deficit.     Motor: No weakness.     Assessment/Plan: 51 year old man with a 60 pack year history of tobacco abuse, newly diagnosed bullous emphysema,  Barrett's esophagus, recently diagnosed adenocarcinoma of the esophagus, hypertension and hyperlipidemia presents with right sided CP and shortness of breath.  He has been found to have a large spontaneous right pneumothorax.  He is having a pleural catheter placed currently.  Right pneumothorax.  Agree with chest tube for immediate relief of symptoms and to prevent decompensation.  However, given the appearance of the lung on CT with multiple large blebs, I  do not think conservative management is appropriate and he should have more definitive management of the blebs.  I recommended to him that we proceed with a right VATS for stapling of blebs tomorrow AM.  Since he may be undergoing an esophagectomy in the near future, I recommended against pleural stripping or abrasion which would normally be a part of the procedure.  That will increase his chance for recurrence but I am afraid pleural symphysis would create significant issues with regards to future esophageal surgery.  I informed him of the general nature of the procedure, including the need for general anesthesia, the incisions to be used, the need for a drainage tube postoperatively, as well the possibility of recurrence.  I informed him of the indications, risks, benefits and alternatives. He understands the risks include, but are not limited to death, MI, DVT, PE, bleeding, possible need for transfusion, infection, air leaks, other organ system dysfunction such as respiratory or renal failure as well as the possibility of other unforeseeable complications.  He accepts the risks and agrees to proceed.  Revonda Standard Roxan Hockey, MD [facility] [phone]   Bruce Graham 04/28/2022, 5:05 PM

## 2022-04-28 NOTE — Progress Notes (Addendum)
Noticed desat as low as 88 when he is sleeping, 2 L of O2 administered. MD on call notified

## 2022-04-28 NOTE — ED Triage Notes (Signed)
Patient sent to ED for further evaluation and management of moderate spontaneous pneumothorax found on x-ray earlier today. Patient is alert, oriented, and in no apparent distress at this time.

## 2022-04-28 NOTE — ED Provider Notes (Signed)
[place]    CSN: [id number] Arrival date & time: 04/28/22  8119      History   Chief Complaint Chief Complaint  Patient presents with   Cough   Nasal Congestion    HPI Bruce Graham is a 51 y.o. male.   Patient presents urgent care for evaluation of cough, shortness of breath, pleuritic chest discomfort bilaterally, headache, and nasal congestion that started abruptly yesterday while at work.  Cough is productive with green/yellow sputum.  Shortness of breath is with rest as well as ambulation and has improved since yesterday.  He attempted using his albuterol inhaler yesterday to help with shortness of breath without very much relief.  He has a history of emphysema and is a current everyday cigarette smoker.  He denies all other drug use.  Chest pain is to the bilateral chest wall and worse with deep inspiration.  Headache is generalized and currently a 3 on a scale 0-10.  Nasal congestion is thick and green/yellow as well.  He felt like he had a fever due to chills yesterday but denies taking his temperature at home due to lack of thermometer.  He took 1 dose of Mucinex yesterday and 1 dose this morning which has helped slightly with his cough and nasal congestion.  Denies nausea, vomiting, dizziness, abdominal pain, low back pain, and ear pain. No sore throat or hemoptysis. He is not vaccinated against COVID-19 or influenza.  He was recently diagnosed with stage I esophageal cancer and denies recent long periods of travel, calf pain, or calf swelling. No heart palpitations. No other triggering or relieving factors identified at this time per patient symptoms.     Cough   Past Medical History:  Diagnosis Date   Abscess of right thigh 06/16/2015   Barrett esophagus    Bronchitis    Complication of anesthesia    hard to wake up after hernia surgery   Ear infection 12/13/2017   taking amoxicillin   Family history of adverse reaction to anesthesia    mom hard to wake  up    GERD (gastroesophageal reflux disease)    HLD (hyperlipidemia)    Hydradenitis    Left leg   Hypertension    Intervertebral disc disorder    Myalgia    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/07/2015   Suppurative hidradenitis    Umbilical hernia without obstruction and without gangrene 11/22/2017    Patient Active Problem List   Diagnosis Date Noted   Pneumothorax 04/28/2022   Leukocytosis 04/28/2022   COPD with acute exacerbation ([place]) 04/28/2022   Esophageal cancer ([place]) 12/17/2021   Hypokalemia 12/10/2021   IBD (inflammatory bowel disease) 12/08/2021   Diarrhea of presumed infectious origin 12/04/2021   Viral conjunctivitis 12/04/2021   Myalgia due to statin 11/13/2021   Agatston coronary artery calcium score greater than 400 11/13/2021   Intervertebral cervical disc disorder with myelopathy, 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/13/2021   No-show for appointment 09/09/2021   History of leukocytosis 09/09/2021   Tobacco abuse 02/17/2021   Family history of heart disease 02/17/2021   Essential hypertension 02/08/2021   Family history of colon cancer    Hidradenitis suppurativa 08/07/2015   GERD (gastroesophageal reflux disease) 06/16/2015    Past Surgical History:  Procedure Laterality Date   COLONOSCOPY WITH PROPOFOL N/A 03/06/2018   Procedure: COLONOSCOPY WITH PROPOFOL;  Surgeon: Lin Landsman, MD;  Location: [facility] ENDOSCOPY;  Service: Gastroenterology;  Laterality: N/A;   COLONOSCOPY WITH PROPOFOL N/A 12/16/2021   Procedure: COLONOSCOPY WITH PROPOFOL;  Surgeon: Robert Bellow, MD;  Location: [facility] ENDOSCOPY;  Service: Endoscopy;  Laterality: N/A;   ESOPHAGOGASTRODUODENOSCOPY (EGD) WITH PROPOFOL N/A 03/06/2018   Procedure: ESOPHAGOGASTRODUODENOSCOPY (EGD) WITH PROPOFOL;  Surgeon: Lin Landsman, MD;  Location: [facility] ENDOSCOPY;  Service: Gastroenterology;  Laterality: N/A;   ESOPHAGOGASTRODUODENOSCOPY (EGD) WITH PROPOFOL N/A 12/16/2021   Procedure: ESOPHAGOGASTRODUODENOSCOPY (EGD)  WITH PROPOFOL;  Surgeon: Robert Bellow, MD;  Location: [facility] ENDOSCOPY;  Service: Endoscopy;  Laterality: N/A;   HERNIA REPAIR     INCISION AND DRAINAGE ABSCESS N/A 05/02/2020   Procedure: INCISION AND DRAINAGE ABSCESS;  Surgeon: Robert Bellow, MD;  Location: [facility] ORS;  Service: General;  Laterality: N/A;   INCISION AND DRAINAGE PERIRECTAL ABSCESS Right 06/16/2015   Procedure: IRRIGATION AND DEBRIDEMENT right inner thigh ABSCESS;  Surgeon: Florene Glen, MD;  Location: [facility] ORS;  Service: General;  Laterality: Right;   INSERTION OF MESH N/A 12/20/2017   Procedure: INSERTION OF MESH;  Surgeon: [name], MD;  Location: [facility] ORS;  Service: General;  Laterality: N/A;   KNEE ARTHROSCOPY WITH ANTERIOR CRUCIATE LIGAMENT (ACL) REPAIR Left 2002   KNEE SURGERY Left    PILONIDAL CYST EXCISION N/A 05/02/2020   Procedure: CYST EXCISION PILONIDAL EXTENSIVE;  Surgeon: Robert Bellow, MD;  Location: [facility] ORS;  Service: General;  Laterality: N/A;   UMBILICAL HERNIA REPAIR N/A 12/20/2017   Procedure: HERNIA REPAIR UMBILICAL ADULT;  Surgeon: [name], MD;  Location: [facility] ORS;  Service: General;  Laterality: N/A;   VIDEO ASSISTED THORACOSCOPY (VATS)/WEDGE RESECTION Right 04/29/2022   Procedure: RIGHT VIDEO ASSISTED THORACOSCOPY (VATS)/ STAPELING OF BLEBS;  Surgeon: [name], MD;  Location: [place];  Service: Thoracic;  Laterality: Right;       Home Medications    Prior to Admission medications   Medication Sig Start Date End Date Taking? Authorizing Provider  baclofen (LIORESAL) 10 MG tablet Take 1 tablet (10 mg total) by mouth 2 (two) times daily. Patient not taking: Reported on 04/28/2022 11/15/21   Hazel Sams, PA-C  ezetimibe (ZETIA) 10 MG tablet Take 1 tablet (10 mg total) by mouth daily. Patient not taking: Reported on 04/28/2022 11/13/21   Crecencio Mc, MD  Fexofenadine HCl [facility] ALLERGY PO) Take 1 tablet by mouth every 8 (eight) hours as needed  (cough).    [provider]  gabapentin (NEURONTIN) 300 MG capsule Take by mouth. Patient not taking: Reported on 04/28/2022 11/27/21   [provider]  mesalamine (LIALDA) 1.2 g EC tablet Take 4.8 g by mouth daily. Patient not taking: Reported on 04/28/2022 03/03/22   [provider]  omeprazole (PRILOSEC) 40 MG capsule Take 1 capsule (40 mg total) by mouth daily. Patient not taking: Reported on 04/28/2022 11/13/21   Crecencio Mc, MD  potassium chloride 20 MEQ/15ML (10%) SOLN Take 15 mLs (20 mEq total) by mouth daily for 4 days. Patient not taking: Reported on 04/28/2022 12/10/21 12/14/21  Crecencio Mc, MD  predniSONE (DELTASONE) 10 MG tablet Six tablets today, decrease by one tablet daily. [phone]) Patient not taking: Reported on 04/28/2022 12/16/21   Robert Bellow, MD    Family History Family History  Problem Relation Age of Onset   Heart disease Father 25   Cancer Father 42       Lung Cancer   Cancer Paternal Grandfather 76       in the bones    Social History Social History   Tobacco Use   Smoking status: Every  Day    Packs/day: 2.00    Years: 30.00    Total pack years: 60.00    Types: Cigarettes   Smokeless tobacco: Never  Vaping Use   Vaping Use: Never used  Substance Use Topics   Alcohol use: Not Currently    Comment: social   Drug use: No     Allergies   Patient has no known allergies.   Review of Systems Review of Systems  Respiratory:  Positive for cough.   Per HPI   Physical Exam Triage Vital Signs ED Triage Vitals [04/28/22 0835]  Enc Vitals Group     BP (!) 161/113     Pulse Rate 74     Resp 19     Temp 97.7 F (36.5 C)     Temp Source Oral     SpO2 97 %     Weight      Height      Head Circumference      Peak Flow      Pain Score 3     Pain Loc      Pain Edu?      Excl. in [place]?    No data found.  Updated Vital Signs BP (!) 162/105 (BP Location: Left Arm)   Pulse 74   Temp 97.7 F (36.5 C) (Oral)    Resp 19   SpO2 97%   Visual Acuity Right Eye Distance:   Left Eye Distance:   Bilateral Distance:    Right Eye Near:   Left Eye Near:    Bilateral Near:     Physical Exam Vitals and nursing note reviewed.  Constitutional:      Appearance: He is not ill-appearing or toxic-appearing.  HENT:     Head: Normocephalic and atraumatic.     Right Ear: Hearing, tympanic membrane, ear canal and external ear normal.     Left Ear: Hearing, tympanic membrane, ear canal and external ear normal.     Nose: Nose normal.     Mouth/Throat:     Lips: Pink.     Mouth: Mucous membranes are moist.     Pharynx: No posterior oropharyngeal erythema.  Eyes:     General: Lids are normal. Vision grossly intact. Gaze aligned appropriately.     Extraocular Movements: Extraocular movements intact.     Conjunctiva/sclera: Conjunctivae normal.  Cardiovascular:     Rate and Rhythm: Normal rate and regular rhythm.     Heart sounds: Normal heart sounds, S1 normal and S2 normal.  Pulmonary:     Effort: Pulmonary effort is normal. No respiratory distress.     Breath sounds: Decreased air movement present. Examination of the right-upper field reveals decreased breath sounds. Examination of the right-middle field reveals decreased breath sounds. Decreased breath sounds present.  Musculoskeletal:     Cervical back: Neck supple.  Skin:    General: Skin is warm and dry.     Capillary Refill: Capillary refill takes less than 2 seconds.     Findings: No rash.  Neurological:     General: No focal deficit present.     Mental Status: He is alert and oriented to person, place, and time. Mental status is at baseline.     Cranial Nerves: No dysarthria or facial asymmetry.  Psychiatric:        Mood and Affect: Mood normal.        Speech: Speech normal.        Behavior: Behavior normal.  Thought Content: Thought content normal.        Judgment: Judgment normal.      UC Treatments / Results  Labs (all labs  ordered are listed, but only abnormal results are displayed) Labs Reviewed - No data to display  EKG   Radiology   Procedures Procedures (including critical care time)  Medications Ordered in UC Medications - No data to display  Initial Impression / Assessment and Plan / UC Course  I have reviewed the triage vital signs and the nursing notes.  Pertinent labs & imaging results that were available during my care of the patient were reviewed by me and considered in my medical decision making (see chart for details).   1.  Spontaneous pneumothorax Radiologist called provider to discuss right-sided spontaneous pneumothorax present to chest x-ray ordered in clinic.  Pneumothorax appears to be approximately 20 to 30%.  Patient to go to the nearest emergency department for further evaluation and management of this acute finding.  His vital signs are hemodynamically stable and he is able to ambulate difficulty although he is short of breath.  He may go to the nearest emergency department via personal vehicle at this time due to stable vital signs and clinical appearance.  Discussed risks of deferring the emergency department visit as this condition can lead to serious adverse outcomes and needs to be evaluated further in the emergency department setting immediately.  He expresses understanding and agreement with this.  Patient discharged to the ER with stable vital signs.  Final Clinical Impressions(s) / UC Diagnoses   Final diagnoses:  Spontaneous pneumothorax     Discharge Instructions      You have a collapsed lung on the right side and I would like for you to go to the ER immediately for further evaluation and management of this. This is causing your shortness of breath and pain with breathing.       ED Prescriptions   None    PDMP not reviewed this encounter.   Talbot Grumbling, [place] 05/01/22 1132

## 2022-04-28 NOTE — Discharge Instructions (Signed)
You have a collapsed lung on the right side and I would like for you to go to the ER immediately for further evaluation and management of this. This is causing your shortness of breath and pain with breathing.

## 2022-04-28 NOTE — ED Notes (Signed)
No response no message I called the nurses phone  it rang for a long time no response on 6n our chaRGE NURSE NOTIFIED

## 2022-04-29 ENCOUNTER — Observation Stay (HOSPITAL_COMMUNITY): Payer: BC Managed Care – PPO | Admitting: Anesthesiology

## 2022-04-29 ENCOUNTER — Inpatient Hospital Stay (HOSPITAL_COMMUNITY): Payer: BC Managed Care – PPO

## 2022-04-29 ENCOUNTER — Telehealth: Payer: Self-pay | Admitting: Pulmonary Disease

## 2022-04-29 ENCOUNTER — Encounter (HOSPITAL_COMMUNITY): Payer: Self-pay | Admitting: Internal Medicine

## 2022-04-29 ENCOUNTER — Ambulatory Visit: Payer: BC Managed Care – PPO

## 2022-04-29 ENCOUNTER — Ambulatory Visit: Payer: BC Managed Care – PPO | Admitting: Radiation Oncology

## 2022-04-29 ENCOUNTER — Encounter (HOSPITAL_COMMUNITY)
Admission: EM | Disposition: A | Discharge status: Home / Self Care | Payer: Self-pay | Source: Home / Self Care | Attending: Internal Medicine

## 2022-04-29 DIAGNOSIS — Z720 Tobacco use: Secondary | ICD-10-CM

## 2022-04-29 DIAGNOSIS — E669 Obesity, unspecified: Secondary | ICD-10-CM | POA: Diagnosis present

## 2022-04-29 DIAGNOSIS — J9312 Secondary spontaneous pneumothorax: Secondary | ICD-10-CM

## 2022-04-29 DIAGNOSIS — J9383 Other pneumothorax: Secondary | ICD-10-CM | POA: Diagnosis present

## 2022-04-29 DIAGNOSIS — J449 Chronic obstructive pulmonary disease, unspecified: Secondary | ICD-10-CM | POA: Diagnosis not present

## 2022-04-29 DIAGNOSIS — Z862 Personal history of diseases of the blood and blood-forming organs and certain disorders involving the immune mechanism: Secondary | ICD-10-CM

## 2022-04-29 DIAGNOSIS — I10 Essential (primary) hypertension: Secondary | ICD-10-CM

## 2022-04-29 DIAGNOSIS — J439 Emphysema, unspecified: Secondary | ICD-10-CM | POA: Diagnosis present

## 2022-04-29 DIAGNOSIS — Z6833 Body mass index (BMI) 33.0-33.9, adult: Secondary | ICD-10-CM | POA: Diagnosis not present

## 2022-04-29 DIAGNOSIS — K5190 Ulcerative colitis, unspecified, without complications: Secondary | ICD-10-CM | POA: Diagnosis present

## 2022-04-29 DIAGNOSIS — R066 Hiccough: Secondary | ICD-10-CM | POA: Diagnosis not present

## 2022-04-29 DIAGNOSIS — K219 Gastro-esophageal reflux disease without esophagitis: Secondary | ICD-10-CM | POA: Diagnosis present

## 2022-04-29 DIAGNOSIS — Z1152 Encounter for screening for COVID-19: Secondary | ICD-10-CM | POA: Diagnosis not present

## 2022-04-29 DIAGNOSIS — C159 Malignant neoplasm of esophagus, unspecified: Secondary | ICD-10-CM | POA: Diagnosis present

## 2022-04-29 DIAGNOSIS — Z8249 Family history of ischemic heart disease and other diseases of the circulatory system: Secondary | ICD-10-CM | POA: Diagnosis not present

## 2022-04-29 DIAGNOSIS — Z825 Family history of asthma and other chronic lower respiratory diseases: Secondary | ICD-10-CM | POA: Diagnosis not present

## 2022-04-29 DIAGNOSIS — J441 Chronic obstructive pulmonary disease with (acute) exacerbation: Secondary | ICD-10-CM

## 2022-04-29 DIAGNOSIS — Z801 Family history of malignant neoplasm of trachea, bronchus and lung: Secondary | ICD-10-CM | POA: Diagnosis not present

## 2022-04-29 DIAGNOSIS — B370 Candidal stomatitis: Secondary | ICD-10-CM | POA: Diagnosis not present

## 2022-04-29 DIAGNOSIS — E785 Hyperlipidemia, unspecified: Secondary | ICD-10-CM | POA: Diagnosis present

## 2022-04-29 DIAGNOSIS — D72829 Elevated white blood cell count, unspecified: Secondary | ICD-10-CM | POA: Diagnosis not present

## 2022-04-29 DIAGNOSIS — C155 Malignant neoplasm of lower third of esophagus: Secondary | ICD-10-CM

## 2022-04-29 DIAGNOSIS — J939 Pneumothorax, unspecified: Secondary | ICD-10-CM | POA: Diagnosis not present

## 2022-04-29 DIAGNOSIS — Z79899 Other long term (current) drug therapy: Secondary | ICD-10-CM | POA: Diagnosis not present

## 2022-04-29 DIAGNOSIS — J9382 Other air leak: Secondary | ICD-10-CM | POA: Diagnosis not present

## 2022-04-29 DIAGNOSIS — F17210 Nicotine dependence, cigarettes, uncomplicated: Secondary | ICD-10-CM | POA: Diagnosis present

## 2022-04-29 DIAGNOSIS — R0902 Hypoxemia: Secondary | ICD-10-CM | POA: Diagnosis not present

## 2022-04-29 DIAGNOSIS — I454 Nonspecific intraventricular block: Secondary | ICD-10-CM | POA: Diagnosis present

## 2022-04-29 HISTORY — PX: VIDEO ASSISTED THORACOSCOPY (VATS)/WEDGE RESECTION: SHX6174

## 2022-04-29 LAB — TYPE AND SCREEN
ABO/RH(D): O POS
Antibody Screen: NEGATIVE

## 2022-04-29 LAB — BASIC METABOLIC PANEL
Anion gap: 8 (ref 5–15)
BUN: 9 mg/dL (ref 6–20)
CO2: 24 mmol/L (ref 22–32)
Calcium: 8.8 mg/dL — ABNORMAL LOW (ref 8.9–10.3)
Chloride: 105 mmol/L (ref 98–111)
Creatinine, Ser: 1.09 mg/dL (ref 0.61–1.24)
GFR, Estimated: >60 mL/min (ref >60)
Glucose, Bld: 89 mg/dL (ref 70–99)
Potassium: 3.5 mmol/L (ref 3.5–5.1)
Sodium: 137 mmol/L (ref 135–145)

## 2022-04-29 LAB — CBC
HCT: 40.5 % (ref 39.0–52.0)
Hemoglobin: 13.1 g/dL (ref 13.0–17.0)
MCH: 26.2 pg (ref 26.0–34.0)
MCHC: 32.3 g/dL (ref 30.0–36.0)
MCV: 81 fL (ref 80.0–100.0)
Platelets: 380 10*3/uL (ref 150–400)
RBC: 5 MIL/uL (ref 4.22–5.81)
RDW: 16.6 % — ABNORMAL HIGH (ref 11.5–15.5)
WBC: 9.9 10*3/uL (ref 4.0–10.5)
nRBC: 0 % (ref 0.0–0.2)

## 2022-04-29 LAB — SURGICAL PCR SCREEN
MRSA, PCR: NEGATIVE
Staphylococcus aureus: NEGATIVE

## 2022-04-29 LAB — ABO/RH: ABO/RH(D): O POS

## 2022-04-29 SURGERY — VIDEO ASSISTED THORACOSCOPY (VATS)/WEDGE RESECTION
Anesthesia: General | Site: Chest | Laterality: Right

## 2022-04-29 MED ORDER — GUAIFENESIN-DM 100-10 MG/5ML PO SYRP
5.0000 mL | ORAL_SOLUTION | ORAL | Status: DC | PRN
  Administered 2022-04-29 – 2022-05-01 (×7): 5 mL via ORAL
  Filled 2022-04-29 (×7): qty 5

## 2022-04-29 MED ORDER — PROPOFOL 10 MG/ML IV BOLUS
INTRAVENOUS | Status: AC
  Filled 2022-04-29: qty 20

## 2022-04-29 MED ORDER — ORAL CARE MOUTH RINSE: 15.0000 mL | Freq: Once | OROMUCOSAL | Status: DC

## 2022-04-29 MED ORDER — ESMOLOL HCL 100 MG/10ML IV SOLN
INTRAVENOUS | Status: DC | PRN
  Administered 2022-04-29: 30 mg via INTRAVENOUS
  Administered 2022-04-29: 40 mg via INTRAVENOUS

## 2022-04-29 MED ORDER — ENOXAPARIN SODIUM 40 MG/0.4ML IJ SOSY
40.0000 mg | PREFILLED_SYRINGE | Freq: Every day | INTRAMUSCULAR | Status: DC
  Administered 2022-04-30 – 2022-05-02 (×3): 40 mg via SUBCUTANEOUS
  Filled 2022-04-29 (×3): qty 0.4

## 2022-04-29 MED ORDER — ROCURONIUM BROMIDE 10 MG/ML (PF) SYRINGE
PREFILLED_SYRINGE | INTRAVENOUS | Status: AC
  Filled 2022-04-29: qty 10

## 2022-04-29 MED ORDER — FENTANYL CITRATE (PF) 250 MCG/5ML IJ SOLN
INTRAMUSCULAR | Status: DC | PRN
  Administered 2022-04-29: 50 ug via INTRAVENOUS
  Administered 2022-04-29: 100 ug via INTRAVENOUS
  Administered 2022-04-29 (×2): 25 ug via INTRAVENOUS
  Administered 2022-04-29: 50 ug via INTRAVENOUS

## 2022-04-29 MED ORDER — CHLORHEXIDINE GLUCONATE 0.12 % MT SOLN: 15.0000 mL | Freq: Once | OROMUCOSAL | Status: DC

## 2022-04-29 MED ORDER — CLEVIDIPINE BUTYRATE 0.5 MG/ML IV EMUL
0.0000 mg/h | INTRAVENOUS | Status: DC
  Filled 2022-04-29: qty 50

## 2022-04-29 MED ORDER — ACETAMINOPHEN 160 MG/5ML PO SOLN
1000.0000 mg | Freq: Four times a day (QID) | ORAL | Status: DC
  Administered 2022-04-30: 1000 mg via ORAL
  Filled 2022-04-29: qty 40.6

## 2022-04-29 MED ORDER — ONDANSETRON HCL 4 MG/2ML IJ SOLN
INTRAMUSCULAR | Status: DC | PRN
  Administered 2022-04-29: 4 mg via INTRAVENOUS

## 2022-04-29 MED ORDER — DEXAMETHASONE SODIUM PHOSPHATE 10 MG/ML IJ SOLN
INTRAMUSCULAR | Status: DC | PRN
  Administered 2022-04-29: 10 mg via INTRAVENOUS

## 2022-04-29 MED ORDER — CEFAZOLIN SODIUM-DEXTROSE 2-4 GM/100ML-% IV SOLN
INTRAVENOUS | Status: AC
  Filled 2022-04-29: qty 100

## 2022-04-29 MED ORDER — OXYCODONE HCL 5 MG PO TABS
5.0000 mg | ORAL_TABLET | Freq: Four times a day (QID) | ORAL | Status: DC | PRN
  Administered 2022-04-30 – 2022-05-02 (×5): 10 mg via ORAL
  Filled 2022-04-29 (×6): qty 2

## 2022-04-29 MED ORDER — DEXAMETHASONE SODIUM PHOSPHATE 10 MG/ML IJ SOLN
INTRAMUSCULAR | Status: AC
  Filled 2022-04-29: qty 1

## 2022-04-29 MED ORDER — 0.9 % SODIUM CHLORIDE (POUR BTL) OPTIME
TOPICAL | Status: DC | PRN
  Administered 2022-04-29: 2000 mL

## 2022-04-29 MED ORDER — PANTOPRAZOLE SODIUM 40 MG PO TBEC
40.0000 mg | DELAYED_RELEASE_TABLET | Freq: Every day | ORAL | Status: DC
  Administered 2022-04-30 – 2022-05-02 (×3): 40 mg via ORAL
  Filled 2022-04-29 (×3): qty 1

## 2022-04-29 MED ORDER — PROMETHAZINE HCL 25 MG/ML IJ SOLN: 6.2500 mg | INTRAMUSCULAR | Status: DC | PRN

## 2022-04-29 MED ORDER — ONDANSETRON HCL 4 MG/2ML IJ SOLN
INTRAMUSCULAR | Status: AC
  Filled 2022-04-29: qty 2

## 2022-04-29 MED ORDER — HYDRALAZINE HCL 20 MG/ML IJ SOLN: 10.0000 mg | INTRAMUSCULAR | Status: DC | PRN

## 2022-04-29 MED ORDER — BUPIVACAINE HCL (PF) 0.5 % IJ SOLN
INTRAMUSCULAR | Status: AC
  Filled 2022-04-29: qty 30

## 2022-04-29 MED ORDER — KETOROLAC TROMETHAMINE 30 MG/ML IJ SOLN
INTRAMUSCULAR | Status: AC
  Filled 2022-04-29: qty 1

## 2022-04-29 MED ORDER — HYDROMORPHONE HCL 1 MG/ML IJ SOLN
INTRAMUSCULAR | Status: AC
  Filled 2022-04-29: qty 1

## 2022-04-29 MED ORDER — ACETAMINOPHEN 10 MG/ML IV SOLN
INTRAVENOUS | Status: AC
  Filled 2022-04-29: qty 100

## 2022-04-29 MED ORDER — KETOROLAC TROMETHAMINE 30 MG/ML IJ SOLN
30.0000 mg | Freq: Four times a day (QID) | INTRAMUSCULAR | Status: DC
  Administered 2022-04-29 – 2022-04-30 (×4): 30 mg via INTRAVENOUS
  Filled 2022-04-29 (×3): qty 1

## 2022-04-29 MED ORDER — MIDAZOLAM HCL 2 MG/2ML IJ SOLN
INTRAMUSCULAR | Status: DC | PRN
  Administered 2022-04-29: 2 mg via INTRAVENOUS

## 2022-04-29 MED ORDER — MIDAZOLAM HCL 2 MG/2ML IJ SOLN
INTRAMUSCULAR | Status: AC
  Filled 2022-04-29: qty 2

## 2022-04-29 MED ORDER — ACETAMINOPHEN 500 MG PO TABS
1000.0000 mg | ORAL_TABLET | Freq: Three times a day (TID) | ORAL | Status: DC
  Administered 2022-04-29: 1000 mg via ORAL
  Filled 2022-04-29: qty 2

## 2022-04-29 MED ORDER — ROCURONIUM BROMIDE 10 MG/ML (PF) SYRINGE
PREFILLED_SYRINGE | INTRAVENOUS | Status: DC | PRN
  Administered 2022-04-29: 60 mg via INTRAVENOUS
  Administered 2022-04-29 (×2): 20 mg via INTRAVENOUS

## 2022-04-29 MED ORDER — HYDROMORPHONE HCL 1 MG/ML IJ SOLN
0.2500 mg | INTRAMUSCULAR | Status: DC | PRN
  Administered 2022-04-29 (×4): 0.5 mg via INTRAVENOUS

## 2022-04-29 MED ORDER — PHENYLEPHRINE 80 MCG/ML (10ML) SYRINGE FOR IV PUSH (FOR BLOOD PRESSURE SUPPORT)
PREFILLED_SYRINGE | INTRAVENOUS | Status: AC
  Filled 2022-04-29: qty 10

## 2022-04-29 MED ORDER — LIDOCAINE 2% (20 MG/ML) 5 ML SYRINGE
INTRAMUSCULAR | Status: AC
  Filled 2022-04-29: qty 5

## 2022-04-29 MED ORDER — ACETAMINOPHEN 10 MG/ML IV SOLN
INTRAVENOUS | Status: DC | PRN
  Administered 2022-04-29: 1000 mg via INTRAVENOUS

## 2022-04-29 MED ORDER — CHLORHEXIDINE GLUCONATE 0.12 % MT SOLN
OROMUCOSAL | Status: AC
  Filled 2022-04-29: qty 15

## 2022-04-29 MED ORDER — LACTATED RINGERS IV SOLN: INTRAVENOUS | Status: DC

## 2022-04-29 MED ORDER — MEPERIDINE HCL 25 MG/ML IJ SOLN: 6.2500 mg | INTRAMUSCULAR | Status: DC | PRN

## 2022-04-29 MED ORDER — SUGAMMADEX SODIUM 200 MG/2ML IV SOLN
INTRAVENOUS | Status: DC | PRN
  Administered 2022-04-29: 200 mg via INTRAVENOUS

## 2022-04-29 MED ORDER — ALBUTEROL SULFATE HFA 108 (90 BASE) MCG/ACT IN AERS
INHALATION_SPRAY | RESPIRATORY_TRACT | Status: AC
  Filled 2022-04-29: qty 6.7

## 2022-04-29 MED ORDER — PROPOFOL 10 MG/ML IV BOLUS
INTRAVENOUS | Status: DC | PRN
  Administered 2022-04-29: 140 mg via INTRAVENOUS

## 2022-04-29 MED ORDER — ALBUTEROL SULFATE HFA 108 (90 BASE) MCG/ACT IN AERS
INHALATION_SPRAY | RESPIRATORY_TRACT | Status: DC | PRN
  Administered 2022-04-29: 8 via RESPIRATORY_TRACT

## 2022-04-29 MED ORDER — LIDOCAINE 2% (20 MG/ML) 5 ML SYRINGE
INTRAMUSCULAR | Status: DC | PRN
  Administered 2022-04-29: 100 mg via INTRAVENOUS

## 2022-04-29 MED ORDER — SODIUM CHLORIDE 0.9 % IV SOLN: INTRAVENOUS | Status: DC

## 2022-04-29 MED ORDER — FENTANYL CITRATE (PF) 250 MCG/5ML IJ SOLN
INTRAMUSCULAR | Status: AC
  Filled 2022-04-29: qty 5

## 2022-04-29 MED ORDER — LACTATED RINGERS IV SOLN: INTRAVENOUS | Status: DC | PRN

## 2022-04-29 MED ORDER — ESMOLOL HCL 100 MG/10ML IV SOLN
INTRAVENOUS | Status: AC
  Filled 2022-04-29: qty 10

## 2022-04-29 MED ORDER — ACETAMINOPHEN 500 MG PO TABS
1000.0000 mg | ORAL_TABLET | Freq: Four times a day (QID) | ORAL | Status: DC
  Administered 2022-04-29 – 2022-05-02 (×9): 1000 mg via ORAL
  Filled 2022-04-29 (×11): qty 2

## 2022-04-29 MED ORDER — CEFAZOLIN SODIUM-DEXTROSE 2-4 GM/100ML-% IV SOLN
2.0000 g | Freq: Three times a day (TID) | INTRAVENOUS | Status: AC
  Administered 2022-04-29 – 2022-04-30 (×2): 2 g via INTRAVENOUS
  Filled 2022-04-29 (×2): qty 100

## 2022-04-29 MED ORDER — PHENYLEPHRINE 80 MCG/ML (10ML) SYRINGE FOR IV PUSH (FOR BLOOD PRESSURE SUPPORT)
PREFILLED_SYRINGE | INTRAVENOUS | Status: DC | PRN
  Administered 2022-04-29: 80 ug via INTRAVENOUS

## 2022-04-29 SURGICAL SUPPLY — 97 items
ADH SKN CLS APL DERMABOND .7 (GAUZE/BANDAGES/DRESSINGS) ×1
BAG SPEC RTRVL LRG 6X4 10 (ENDOMECHANICALS)
BLADE CLIPPER SURG (BLADE) ×2 IMPLANT
CANISTER SUCT 3000ML PPV (MISCELLANEOUS) ×4 IMPLANT
CATH THORACIC 28FR (CATHETERS) IMPLANT
CATH THORACIC 36FR (CATHETERS) IMPLANT
CATH THORACIC 36FR RT ANG (CATHETERS) IMPLANT
CLIP VESOCCLUDE MED 6/CT (CLIP) ×2 IMPLANT
CNTNR URN SCR LID CUP LEK RST (MISCELLANEOUS) ×2 IMPLANT
CONN ST 1/4X3/8  BEN (MISCELLANEOUS) ×1
CONN ST 1/4X3/8 BEN (MISCELLANEOUS) IMPLANT
CONN Y 3/8X3/8X3/8  BEN (MISCELLANEOUS)
CONN Y 3/8X3/8X3/8 BEN (MISCELLANEOUS) IMPLANT
CONT SPEC 4OZ STRL OR WHT (MISCELLANEOUS) ×7
COVER SURGICAL LIGHT HANDLE (MISCELLANEOUS) ×2 IMPLANT
DEFOGGER SCOPE WARMER CLEARIFY (MISCELLANEOUS) IMPLANT
DERMABOND ADVANCED .7 DNX12 (GAUZE/BANDAGES/DRESSINGS) ×1 IMPLANT
DRAIN CHANNEL 28F RND 3/8 FF (WOUND CARE) IMPLANT
DRAIN CHANNEL 32F RND 10.7 FF (WOUND CARE) IMPLANT
DRAPE CV SPLIT W-CLR ANES SCRN (DRAPES) ×2 IMPLANT
DRAPE ORTHO SPLIT 77X108 STRL (DRAPES) ×1
DRAPE SURG ORHT 6 SPLT 77X108 (DRAPES) ×1 IMPLANT
DRAPE WARM FLUID 44X44 (DRAPES) ×2 IMPLANT
ELECT BLADE 6.5 EXT (BLADE) ×2 IMPLANT
ELECT REM PT RETURN 9FT ADLT (ELECTROSURGICAL) ×1
ELECTRODE REM PT RTRN 9FT ADLT (ELECTROSURGICAL) ×1 IMPLANT
GAUZE 4X4 16PLY ~~LOC~~+RFID DBL (SPONGE) ×1 IMPLANT
GAUZE SPONGE 4X4 12PLY STRL (GAUZE/BANDAGES/DRESSINGS) IMPLANT
GLOVE BIO SURGEON STRL SZ 6.5 (GLOVE) IMPLANT
GLOVE BIOGEL PI IND STRL 6.5 (GLOVE) IMPLANT
GLOVE SS BIOGEL STRL SZ 7.5 (GLOVE) ×1 IMPLANT
GLOVE SURG SIGNA 7.5 PF LTX (GLOVE) ×4 IMPLANT
GLOVE SURG SS PI 7.5 STRL IVOR (GLOVE) IMPLANT
GOWN STRL REUS W/ TWL LRG LVL3 (GOWN DISPOSABLE) ×4 IMPLANT
GOWN STRL REUS W/ TWL XL LVL3 (GOWN DISPOSABLE) ×2 IMPLANT
GOWN STRL REUS W/TWL LRG LVL3 (GOWN DISPOSABLE) ×2
GOWN STRL REUS W/TWL XL LVL3 (GOWN DISPOSABLE) ×2
KIT BASIN OR (CUSTOM PROCEDURE TRAY) ×1 IMPLANT
KIT SUCTION CATH 14FR (SUCTIONS) ×2 IMPLANT
KIT TURNOVER KIT B (KITS) ×1 IMPLANT
NDL SPNL 18GX3.5 QUINCKE PK (NEEDLE) IMPLANT
NEEDLE SPNL 18GX3.5 QUINCKE PK (NEEDLE) IMPLANT
NS IRRIG 1000ML POUR BTL (IV SOLUTION) ×6 IMPLANT
PACK CHEST (CUSTOM PROCEDURE TRAY) ×2 IMPLANT
PAD ARMBOARD 7.5X6 YLW CONV (MISCELLANEOUS) ×4 IMPLANT
POUCH ENDO CATCH II 15MM (MISCELLANEOUS) IMPLANT
POUCH SPECIMEN RETRIEVAL 10MM (ENDOMECHANICALS) IMPLANT
RELOAD STAPLE 45 PURP MED/THCK (STAPLE) IMPLANT
RELOAD TRI 45 ART MED THCK BLK (STAPLE) IMPLANT
RELOAD TRI 45 ART MED THCK PUR (STAPLE) ×5 IMPLANT
RELOAD TRI 60 ART MED THCK BLK (STAPLE) IMPLANT
RELOAD TRI 60 ART MED THCK PUR (STAPLE) IMPLANT
SCISSORS LAP 5X35 DISP (ENDOMECHANICALS) IMPLANT
SEALANT PROGEL (MISCELLANEOUS) IMPLANT
SEALANT SURG COSEAL 4ML (VASCULAR PRODUCTS) IMPLANT
SEALANT SURG COSEAL 8ML (VASCULAR PRODUCTS) IMPLANT
SHEARS HARMONIC HDI 20CM (ELECTROSURGICAL) IMPLANT
SOL ANTI FOG 6CC (MISCELLANEOUS) ×1 IMPLANT
SOLUTION ANTI FOG 6CC (MISCELLANEOUS) ×1
SPECIMEN JAR MEDIUM (MISCELLANEOUS) ×2 IMPLANT
SPONGE INTESTINAL PEANUT (DISPOSABLE) ×1 IMPLANT
SPONGE TONSIL TAPE 1 RFD (DISPOSABLE) ×1 IMPLANT
STAPLER ENDO GIA 12 SHRT THIN (STAPLE) IMPLANT
STAPLER ENDO GIA 12MM SHORT (STAPLE) ×1 IMPLANT
SUT PROLENE 4 0 RB 1 (SUTURE)
SUT PROLENE 4-0 RB1 .5 CRCL 36 (SUTURE) IMPLANT
SUT SILK  1 MH (SUTURE) ×2
SUT SILK 1 MH (SUTURE) ×2 IMPLANT
SUT SILK 1 TIES 10X30 (SUTURE) ×1 IMPLANT
SUT SILK 2 0 SH (SUTURE) IMPLANT
SUT SILK 2 0SH CR/8 30 (SUTURE) IMPLANT
SUT SILK 3 0 SH 30 (SUTURE) IMPLANT
SUT SILK 3 0SH CR/8 30 (SUTURE) ×1 IMPLANT
SUT VIC AB 0 CTX 27 (SUTURE) IMPLANT
SUT VIC AB 1 CTX 27 (SUTURE) ×1 IMPLANT
SUT VIC AB 2-0 CT1 27 (SUTURE)
SUT VIC AB 2-0 CT1 TAPERPNT 27 (SUTURE) IMPLANT
SUT VIC AB 2-0 CTX 36 (SUTURE) ×4 IMPLANT
SUT VIC AB 3-0 MH 27 (SUTURE) IMPLANT
SUT VIC AB 3-0 SH 27 (SUTURE)
SUT VIC AB 3-0 SH 27X BRD (SUTURE) IMPLANT
SUT VIC AB 3-0 X1 27 (SUTURE) ×2 IMPLANT
SUT VICRYL 0 UR6 27IN ABS (SUTURE) IMPLANT
SUT VICRYL 2 TP 1 (SUTURE) IMPLANT
SYR 10ML LL (SYRINGE) ×1 IMPLANT
SYR 20ML LL LF (SYRINGE) ×2 IMPLANT
SYR 30ML LL (SYRINGE) ×1 IMPLANT
SYR 50ML LL SCALE MARK (SYRINGE) ×1 IMPLANT
SYSTEM SAHARA CHEST DRAIN ATS (WOUND CARE) ×2 IMPLANT
TIP APPLICATOR SPRAY EXTEND 16 (VASCULAR PRODUCTS) IMPLANT
TOWEL GREEN STERILE (TOWEL DISPOSABLE) ×1 IMPLANT
TOWEL GREEN STERILE FF (TOWEL DISPOSABLE) ×2 IMPLANT
TRAY FOLEY MTR SLVR 16FR STAT (SET/KITS/TRAYS/PACK) ×2 IMPLANT
TROCAR XCEL BLADELESS 5X75MML (TROCAR) ×2 IMPLANT
TROCAR XCEL NON-BLD 5MMX100MML (ENDOMECHANICALS) IMPLANT
TUBING EXTENTION W/L.L. (IV SETS) ×1 IMPLANT
WATER STERILE IRR 1000ML POUR (IV SOLUTION) ×2 IMPLANT

## 2022-04-29 NOTE — Progress Notes (Addendum)
PROGRESS NOTE    Bruce Graham  RWE:[id number] DOB: 1971/05/18 DOA: 04/28/2022 PCP: Crecencio Mc, MD    Brief Narrative:  Bruce Graham is a 51 y.o. male with medical history significant of probable COPD not on home O2, tobacco abuse, GERD, hypertension, hyperlipidemia presented to hospital with cough shortness of breath and pleuritic chest pain.  History of cigarette smoking.  He had sudden onset of shortness of breath and pleuritic chest pain after his chronic cough.  He did initially go to a local urgent care and was noted to have pneumothorax and was sent to our hospital.  In the ED patient was afebrile with elevated blood pressure and oxygen saturation was 94 to 98% on room air.  WBC was 11.8.  Chest x-ray showed emphysema with apical blebs with moderate right pneumothorax.  CT chest showed moderate to large right pneumothorax.  Pulmonary was consulted and patient was admitted hospital for further evaluation and treatment.   Assessment and plan.  Principal Problem:   Pneumothorax Active Problems:   Essential hypertension   Tobacco abuse   History of leukocytosis   Esophageal cancer (HCC)   Leukocytosis   COPD with acute exacerbation (HCC)   Moderate oderate to large right-sided pleural pneumothorax #COPD with emphysematous blebs #Tobacco abuse Patient was seen by critical care and underwent chest tube insertion on 04/28/2022.  .  CT surgery was consulted due to emphysematous bleb and patient underwent video-assisted thoracoscopic stapling of blebs of the right upper and middle lobe blebs on 04/29/2022.  Further management plan as per CT surgery and pulmonary.  Essential hypertension Not on medications at home.  We will continue to monitor.  Hyperlipidemia.   Continue Zetia.  Leukocytosis likely reactive.  Will monitor.  # Esophageal cancer, cT1N0M0, stage MMR normal  # h/o Barrett's esophagus. Follow-up with oncology as outpatient.    DVT prophylaxis: SCDs Start:  04/28/22 1821 Pneumatic SCD boots to accompany all patients to O.R. Start: 04/28/22 1821   Code Status:     Code Status: Full Code  Disposition: Home  Status is: Inpatient  Remains inpatient appropriate because: Status post chest tube placement, staples in the blebs, need for further monitoring,   Family Communication: Spoke with the patient's sister at bedside.  Consultants:  Pulmonary Cardiothoracic surgery  Procedures:  Right-sided chest tube placement 04/28/2022. VATS with staples in the emphysematous blebs on 04/29/2022  Antimicrobials:  Pre-op antibiotic with cefazolin  Anti-infectives (From admission, onward)    Start     Dose/Rate Route Frequency Ordered Stop   04/29/22 0910  ceFAZolin (ANCEF) 2-4 GM/100ML-% IVPB  Status:  Discontinued       Note to Pharmacy: Mendel Corning B: cabinet override      04/29/22 0910 04/29/22 0943   04/28/22 1820  ceFAZolin (ANCEF) IVPB 2g/100 mL premix        2 g 200 mL/hr over 30 Minutes Intravenous 30 min pre-op 04/28/22 1820 04/29/22 1045        Subjective: Today, patient was seen and examined at bedside.  Seen after VATS procedure.  Feels slightly sleepy but has some pain.  Patient's daughter at bedside.  Objective: Vitals:   04/29/22 1215 04/29/22 1230 04/29/22 1245 04/29/22 1300  BP: 139/81 (!) 134/94 123/80 125/77  Pulse: 79 86 89 87  Resp: (!) 23 (!) 25 20 (!) 21  Temp:      TempSrc:      SpO2: 93% 90% 90% (!) 89%  Weight:      Height:  Intake/Output Summary (Last 24 hours) at 04/29/2022 1320 Last data filed at 04/29/2022 1145 Gross per 24 hour  Intake 1000 ml  Output 825 ml  Net 175 ml   Filed Weights   04/28/22 2100 04/29/22 0859  Weight: 105.6 kg 105.6 kg    Physical Examination: Body mass index is 33.4 kg/m.  General: Obese.  Built, not in obvious distress HENT:   No scleral pallor or icterus noted. Oral mucosa is moist.  Chest: Diminished breath sounds bilaterally, right-sided chest tube in  place. CVS: S1 &S2 heard. No murmur.  Regular rate and rhythm. Abdomen: Soft, nontender, nondistended.  Bowel sounds are heard.  Foley catheter in place. Extremities: No cyanosis, clubbing or edema.  Peripheral pulses are palpable. Psych: Alert, awake mildly somnolent. CNS:  No cranial nerve deficits.  Power equal in all extremities.   Skin: Warm and dry.  No rashes noted.  Data Reviewed:   CBC: Recent Labs  Lab 04/28/22 1155 04/28/22 1818 04/29/22 0324  WBC 11.8* 10.4 9.9  NEUTROABS 7.5  --   --   HGB 14.7 14.1 13.1  HCT 44.1 43.1 40.5  MCV 80.8 80.4 81.0  PLT 438* 405* 761    Basic Metabolic Panel: Recent Labs  Lab 04/28/22 1155 04/28/22 1818 04/29/22 0324  NA 139 139 137  K 3.8 3.5 3.5  CL 105 105 105  CO2 _0 GLUCOSE 109* 90 89  BUN 5* 6 9  CREATININE 0.99 0.89 1.09  CALCIUM 9.1 9.0 8.8*    Liver Function Tests: Recent Labs  Lab 04/28/22 1818  AST 20  ALT 13  ALKPHOS 76  BILITOT 1.0  PROT 7.8  ALBUMIN 3.3*     Radiology Studies: DG Chest Port 1 View  Result Date: 04/29/2022 CLINICAL DATA:  Status post thoracotomy, history of pneumothorax EXAM: PORTABLE CHEST 1 VIEW COMPARISON:  04/28/2022 FINDINGS: Single frontal view of the chest demonstrates interval postsurgical changes from partial right lung resection. Right chest tube tip at the right apex. There is a trace right apical pneumothorax, volume estimated less than 5%. Volume loss in the right hemithorax consistent with partial right pneumonectomy. No focal consolidation or effusion. IMPRESSION: 1. Postsurgical changes from partial right lung resection. 2. Trace right apical pneumothorax, with right-sided chest tube in place. Volume estimated less than 5%. These results will be called to the ordering clinician or representative by the Radiologist Assistant, and communication documented in the PACS or [employer]. Electronically Signed   By: Randa Ngo M.D.   On: 04/29/2022 12:49   DG Chest  Port 1 View  Result Date: 04/28/2022 CLINICAL DATA:  Chest tube placement, post tube placement for pneumothorax. EXAM: PORTABLE CHEST 1 VIEW COMPARISON:  Chest CT April 28, 2022. FINDINGS: Post placement of RIGHT-sided chest tube. Tube coiled in the RIGHT upper chest Re-expansion of the RIGHT lung. Perhaps very small pneumothorax at the RIGHT lung apex. Presence of blebs in this region makes assessment difficult. Lateral and basilar component has resolved. Perhaps minimal volume loss along the minor fissure in the RIGHT chest persists. Bullous disease in the LEFT chest at the LEFT lung apex as well. LEFT chest is clear otherwise. On limited assessment there is no acute skeletal process. IMPRESSION: 1. Post placement of RIGHT-sided chest tube with reexpansion of the RIGHT lung as described above. Perhaps very small pneumothorax at the RIGHT lung apex. 2. Bullous disease in the LEFT lung apex. Electronically Signed   By: Jewel Baize.D.  On: 04/28/2022 17:41   CT CHEST WO CONTRAST  Result Date: 04/28/2022 CLINICAL DATA:  Pneumothorax EXAM: CT CHEST WITHOUT CONTRAST TECHNIQUE: Multidetector CT imaging of the chest was performed following the standard protocol without IV contrast. RADIATION DOSE REDUCTION: This exam was performed according to the departmental dose-optimization program which includes automated exposure control, adjustment of the mA and/or kV according to patient size and/or use of iterative reconstruction technique. COMPARISON:  Same day chest radiograph. FINDINGS: Cardiovascular: Coronary artery atherosclerosis. Normal heart size. No pericardial effusion. Mediastinum/Nodes: No enlarged mediastinal or axillary lymph nodes. Thyroid gland, trachea, and esophagus demonstrate no significant findings. Lungs/Pleura: Moderate emphysema with apical blebs. Moderate to large right pneumothorax. Scarring/atelectasis in the right upper lobe. No consolidation. No sizable pleural effusions. Upper Abdomen:  No acute findings. Small gallstones, partially imaged. Diverticulosis. Musculoskeletal: No fracture is seen. IMPRESSION: 1. Moderate to large right pneumothorax. 2. Scarring/atelectasis in the right upper lobe. 3. Emphysema (ICD10-J43.9) with apical blebs. Electronically Signed   By: Margaretha Sheffield M.D.   On: 04/28/2022 12:57   DG Chest 2 View  Result Date: 04/28/2022 CLINICAL DATA:  Shortness of breath EXAM: CHEST - 2 VIEW COMPARISON:  05/24/2021 FINDINGS: Moderate right pneumothorax seen laterally and apically, at least 20%. Chronic interstitial coarsening with apical emphysema and bullae. Normal heart size. Aortic tortuosity. These results were called by telephone at the time of interpretation on 04/28/2022 at 10:00 am to provider [employer] , who verbally acknowledged these results. IMPRESSION: 1. Moderate right pneumothorax, at least 20%. 2. Emphysema with apical blebs. Electronically Signed   By: Jorje Guild M.D.   On: 04/28/2022 10:23      LOS: 0 days    Flora Lipps, MD [facility] Available via Epic secure chat 7am-7pm After these hours, please refer to coverage provider listed on [email or web address] 04/29/2022, 1:20 PM

## 2022-04-29 NOTE — Transfer of Care (Addendum)
Immediate Anesthesia Transfer of Care Note  Patient: Bruce Graham  Procedure(s) Performed: RIGHT VIDEO ASSISTED THORACOSCOPY (VATS)/ STAPELING OF BLEBS (Right: Chest)  Patient Location: PACU  Anesthesia Type:General  Level of Consciousness: awake, alert , and oriented  Airway & Oxygen Therapy: Patient Spontanous Breathing and Patient connected to face mask oxygen  Post-op Assessment: Report given to RN and Post -op Vital signs reviewed and stable  Post vital signs: Reviewed and stable  Last Vitals:  Vitals Value Taken Time  BP 135/97   Temp    Pulse 77   Resp 21   SpO2 94     Last Pain:  Vitals:   04/29/22 0915  TempSrc:   PainSc: 0-No pain      Patients Stated Pain Goal: 0 (24/46/28 6381)  Complications: No notable events documented.

## 2022-04-29 NOTE — Brief Op Note (Addendum)
04/28/2022 - 04/29/2022  11:52 AM  PATIENT:  Bruce Graham  51 y.o. male  PRE-OPERATIVE DIAGNOSIS:  Right Pneumothorax, Severe bullous  Emphysema  POST-OPERATIVE DIAGNOSIS:  RRight Pneumothorax, Severe bullous  Emphysema  PROCEDURE:   RIGHT VIDEO ASSISTED THORACOSCOPY (VATS) with STAPELING OF BLEBS RIGHT UPPER AND MIDDLE LOBES  SURGEON:  [name], MD   PHYSICIAN ASSISTANT: [place]  ASSISTANTS: Bruins, [place] L, Scrub Person                     Buzzy Han, RN, Radiation protection practitioner   ANESTHESIA:   general  EBL: 44m  BLOOD ADMINISTERED:none  DRAINS:  248fBlake in right pleural space    LOCAL MEDICATIONS USED:  NONE  SPECIMEN:  Multiple blebs with segments of right upper and middle lobes  DISPOSITION OF SPECIMEN:  PATHOLOGY  COUNTS:  YES  DICTATION: .Dragon Dictation  PLAN OF CARE: Admit to inpatient   PATIENT DISPOSITION:  PACU - hemodynamically stable.   Delay start of Pharmacological VTE agent (>24hrs) due to surgical blood loss or risk of bleeding: no

## 2022-04-29 NOTE — Progress Notes (Signed)
Receive new order to D/C Cleviprex and new order for hydralazine 2m IV q4 Prn.

## 2022-04-29 NOTE — Anesthesia Procedure Notes (Signed)
Arterial Line Insertion Start/End11/07/2021 9:35 AM, 04/29/2022 9:45 AM Performed by: Kyung Rudd, CRNA  Patient location: Pre-op. Preanesthetic checklist: patient identified, IV checked, site marked, risks and benefits discussed, surgical consent, monitors and equipment checked, pre-op evaluation and timeout performed Lidocaine 1% used for infiltration Left, radial was placed Catheter size: 20 G Hand hygiene performed , maximum sterile barriers used  and Seldinger technique used Allen's test indicative of satisfactory collateral circulation Attempts: 1 Procedure performed without using ultrasound guided technique. Following insertion, Biopatch and dressing applied. Patient tolerated the procedure well with no immediate complications.

## 2022-04-29 NOTE — Progress Notes (Signed)
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  Recently diagnosed esophageal cancer stage I and he interchanging the concepts of esophagectomy versus radiation treatment this time.  He notes he was increasingly short of breath yesterday which was 04/27/2022.  He was seen at urgent care was noted to have a pneumothorax.  He presented to the [facility] emergency room today with improvement in his respiratory status checks x-ray was showing 15% right pneumothorax.  Due to his history of severe bullous emphysema we will repeat CT noncontrast to make sure this is a true pneumothorax versus a ruptured bullae.  He reports breathing better, he is in no acute distress at this time.  Pertinent  Medical History   Past Medical History:  Diagnosis Date   Abscess of right thigh 06/16/2015   Barrett esophagus    Bronchitis    Complication of anesthesia    hard to wake up after hernia surgery   Ear infection 12/13/2017   taking amoxicillin   Family history of adverse reaction to anesthesia    mom hard to wake up    GERD (gastroesophageal reflux disease)    HLD (hyperlipidemia)    Hydradenitis    Left leg   Hypertension    Intervertebral disc disorder    Myalgia    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/07/2015   Suppurative hidradenitis    Umbilical hernia without obstruction and without gangrene 11/22/2017     Significant Hospital Events: Including procedures, antibiotic start and stop dates in addition to other pertinent events   04/28/2022 noncontrasted CT of the chest 11/2 vats  Interim History / Subjective:  51 year old male with recent diagnosis esophageal cancer has severe bullous emphysema on the CT scan done earlier this  month.  There is concern he may have a ruptured bullae rather than pneumothorax.  Objective   Blood pressure (!) 158/90, pulse 76, temperature 98.2 F (36.8 C), temperature source Oral, resp. rate 18, height 5' 10"  (1.778 m), weight 105.6 kg, SpO2 95 %.        Intake/Output Summary (Last 24 hours) at 04/29/2022 1038 Last data filed at 04/29/2022 0750 Gross per 24 hour  Intake 100 ml  Output 600 ml  Net -500 ml   Filed Weights   04/28/22 2100 04/29/22 0859  Weight: 105.6 kg 105.6 kg    Examination: General: nad HENT: no jvd Lungs: decreased air movement. Dull percussion throughout Cardiovascular: Heart sounds irregular regular rate rhythm Abdomen: Soft nontender positive bowel sounds Extremities: Warm dry without edema Neuro: Grossly intact without focal defect voids GU: voids  Resolved Hospital Problem list     Assessment & Plan:  New onset of shortness of breath with chest x-ray concerning for right pneumothorax recent CT shows severe bullous emphysema with multiple bullae and chest x-ray may represent ruptured bullae or pneumothoraces.  Not currently in respiratory distress.  Note he quit smoking 24 hours ago. Status post insertion of chest tube 04/28/2022 04/29/2022 VATS per thoracic surgery he has a follow-up with Dr. Madie Reno arranged.     Recent diagnosis of esophageal cancer stage I questionable esophagectomy versus radiation treatment he is making that decision at this time. Per oncology  Best Practice (right click and "Reselect all SmartList Selections" daily)  Diet/type: NPO DVT prophylaxis: not indicated GI prophylaxis: PPI Lines: N/A Foley:  N/A Code Status:  full code Last date of multidisciplinary goals of care discussion [tbd]  Labs   CBC: Recent Labs  Lab 04/28/22 1155 04/28/22 1818 04/29/22 0324  WBC 11.8* 10.4 9.9  NEUTROABS 7.5  --   --   HGB 14.7 14.1 13.1  HCT 44.1 43.1 40.5  MCV 80.8 80.4 81.0  PLT 438* 405* 380    Basic  Metabolic Panel: Recent Labs  Lab 04/28/22 1155 04/28/22 1818 04/29/22 0324  NA 139 139 137  K 3.8 3.5 3.5  CL 105 105 105  CO2 24 22 24   GLUCOSE 109* 90 89  BUN 5* 6 9  CREATININE 0.99 0.89 1.09  CALCIUM 9.1 9.0 8.8*   GFR: Estimated Creatinine Clearance: 96.4 mL/min (by C-G formula based on SCr of 1.09 mg/dL). Recent Labs  Lab 04/28/22 1155 04/28/22 1818 04/29/22 0324  WBC 11.8* 10.4 9.9    Liver Function Tests: Recent Labs  Lab 04/28/22 1818  AST 20  ALT 13  ALKPHOS 76  BILITOT 1.0  PROT 7.8  ALBUMIN 3.3*   No results for input(s): "LIPASE", "AMYLASE" in the last 168 hours. No results for input(s): "AMMONIA" in the last 168 hours.  ABG No results found for: "PHART", "PCO2ART", "PO2ART", "HCO3", "TCO2", "ACIDBASEDEF", "O2SAT"   Coagulation Profile: No results for input(s): "INR", "PROTIME" in the last 168 hours.  Cardiac Enzymes: No results for input(s): "CKTOTAL", "CKMB", "CKMBINDEX", "TROPONINI" in the last 168 hours.  HbA1C: Hgb A1c MFr Bld  Date/Time Value Ref Range Status  05/01/2020 08:54 AM 5.3 4.8 - 5.6 % Final    Comment:    (NOTE) Pre diabetes:          5.7%-6.4%  Diabetes:              >6.4%  Glycemic control for   <7.0% adults with diabetes     CBG: No results for input(s): "GLUCAP" in the last 168 hours.   Critical care time: Ferol Luz Kanya Potteiger ACNP Acute Care Nurse Practitioner Lillie Please consult Amion 04/29/2022, 10:38 AM

## 2022-04-29 NOTE — Hospital Course (Addendum)
Bruce Graham is a 51 y.o. male with medical history significant of probable COPD not on home O2, tobacco abuse, GERD, hypertension, hyperlipidemia presented to hospital with cough shortness of breath and pleuritic chest pain.  History of cigarette smoking.  He had sudden onset of shortness of breath and pleuritic chest pain after his chronic cough.  He did initially go to a local urgent care and was noted to have pneumothorax and was sent to our hospital.  In the ED patient was afebrile with elevated blood pressure and oxygen saturation was 94 to 98% on room air.  WBC was 11.8.  Chest x-ray showed emphysema with apical blebs with moderate right pneumothorax.  CT chest showed moderate to large right pneumothorax.  Pulmonary was consulted and patient was admitted hospital for further evaluation and treatment.   Assessment and plan.  Moderate oderate to large right-sided pleural pneumothorax #COPD with emphysematous blebs #Tobacco abuse Patient was seen by critical care and underwent chest tube insertion on 04/28/2022.  .  CT surgery has been consulted and plan for video-assisted thoracoscopic stapling of blebs.  Patient underwent stapling of the right upper and middle lobe blebs on 04/29/2022.  Further management plan as per CT surgery and pulmonary.  Essential hypertension Not on medications at home.  We will continue to monitor.  Hyperlipidemia.   Continue Zetia.  Leukocytosis likely reactive.  Will monitor.  # Esophageal cancer, cT1N0M0, stage MMR normal  # h/o Barrett's esophagus. Follow-up with oncology as outpatient.

## 2022-04-29 NOTE — Anesthesia Procedure Notes (Signed)
Procedure Name: Intubation Date/Time: 04/29/2022 10:13 AM  Performed by: Carolan Clines, CRNAPre-anesthesia Checklist: Patient identified, Emergency Drugs available, Suction available and Patient being monitored Patient Re-evaluated:Patient Re-evaluated prior to induction Oxygen Delivery Method: Circle System Utilized Preoxygenation: Pre-oxygenation with 100% oxygen Induction Type: IV induction Ventilation: Mask ventilation with difficulty, Oral airway inserted - appropriate to patient size and Two handed mask ventilation required Laryngoscope Size: Mac and 4 Grade View: Grade I Tube type: Oral Endobronchial tube: Left and Double lumen EBT and 39 Fr Number of attempts: 1 Airway Equipment and Method: Oral airway and Stylet Placement Confirmation: ETT inserted through vocal cords under direct vision, positive ETCO2 and breath sounds checked- equal and bilateral Tube secured with: Tape Dental Injury: Teeth and Oropharynx as per pre-operative assessment  Comments: 39 Fr VivaSight DLT

## 2022-04-29 NOTE — Interval H&P Note (Signed)
History and Physical Interval Note:  04/29/2022 9:17 AM  Bruce Graham  has presented today for surgery, with the diagnosis of Right Pneumothorax.  The various methods of treatment have been discussed with the patient and family. After consideration of risks, benefits and other options for treatment, the patient has consented to  Procedure(s): RIGHT VIDEO ASSISTED THORACOSCOPY (VATS)/ STAPELING OF BLEBS (Right) as a surgical intervention.  The patient's history has been reviewed, patient examined, no change in status, stable for surgery.  I have reviewed the patient's chart and labs.  Questions were answered to the patient's satisfaction.     [name]

## 2022-04-29 NOTE — Telephone Encounter (Signed)
Please schedule patient for hospital follow up with me in 4-6 weeks for emphysema.  Thanks, JD

## 2022-04-29 NOTE — Progress Notes (Incomplete)
Radiation Oncology         [phone] ________________________________  Name: Bruce Graham        MRN: [id number]  Date of Service: 04/29/2022 DOB: 10-03-1970  UX:NATFT, Aris Everts, MD  Truitt Merle, MD     REFERRING PHYSICIAN: Truitt Merle, MD   DIAGNOSIS: The encounter diagnosis was Malignant neoplasm of lower third of esophagus ([place]).   HISTORY OF PRESENT ILLNESS: Bruce Graham is a 51 y.o. male seen at the request of Dr. Burr Medico for diagnosis of adenocarcinoma of the distal esophagus .  The patient has a history of Barrett's esophagus and routine endoscopy on 12/16/2021 showed visible esophagitis but no evidence of ulcerative findings.  Biopsies taken throughout the region of the esophagitis showed some low-grade dysplastic findings as well as adenocarcinoma at the 36 cm biopsy.  None of the other specimens were malignant.  He underwent staging CT of the the chest on 12/25/2021 which was negative without evidence of metastatic disease.  Endoscopy on 01/04/2022 was unable to measure a mass, and PET scan on 01/28/2022 did not show any evidence of PET avid disease.  He was referred to Dr. Elenor Quinones and underwent endoscopic resection on 03/15/2022 and pathology confirmed well-differentiated adenocarcinoma invading the submucosa with positive margins.  He was encouraged to proceed with esophagectomy for his stage I disease but has been unsure about proceeding surgically.  He is seen to discuss possibilities of chemoradiation but is aware that the standard of care would be to proceed with esophagectomy.    PREVIOUS RADIATION THERAPY: {EXAM; YES/NO:19492::"No"}   PAST MEDICAL HISTORY:  Past Medical History:  Diagnosis Date   Abscess of right thigh 06/16/2015   Barrett esophagus    Bronchitis    Complication of anesthesia    hard to wake up after hernia surgery   Ear infection 12/13/2017   taking amoxicillin   Family history of adverse reaction to anesthesia    mom hard to wake up    GERD  (gastroesophageal reflux disease)    HLD (hyperlipidemia)    Hydradenitis    Left leg   Hypertension    Intervertebral disc disorder    Myalgia    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/07/2015   Suppurative hidradenitis    Umbilical hernia without obstruction and without gangrene 11/22/2017       PAST SURGICAL HISTORY: Past Surgical History:  Procedure Laterality Date   COLONOSCOPY WITH PROPOFOL N/A 03/06/2018   Procedure: COLONOSCOPY WITH PROPOFOL;  Surgeon: Lin Landsman, MD;  Location: [facility] ENDOSCOPY;  Service: Gastroenterology;  Laterality: N/A;   COLONOSCOPY WITH PROPOFOL N/A 12/16/2021   Procedure: COLONOSCOPY WITH PROPOFOL;  Surgeon: Robert Bellow, MD;  Location: [facility] ENDOSCOPY;  Service: Endoscopy;  Laterality: N/A;   ESOPHAGOGASTRODUODENOSCOPY (EGD) WITH PROPOFOL N/A 03/06/2018   Procedure: ESOPHAGOGASTRODUODENOSCOPY (EGD) WITH PROPOFOL;  Surgeon: Lin Landsman, MD;  Location: [facility] ENDOSCOPY;  Service: Gastroenterology;  Laterality: N/A;   ESOPHAGOGASTRODUODENOSCOPY (EGD) WITH PROPOFOL N/A 12/16/2021   Procedure: ESOPHAGOGASTRODUODENOSCOPY (EGD) WITH PROPOFOL;  Surgeon: Robert Bellow, MD;  Location: [facility] ENDOSCOPY;  Service: Endoscopy;  Laterality: N/A;   HERNIA REPAIR     INCISION AND DRAINAGE ABSCESS N/A 05/02/2020   Procedure: INCISION AND DRAINAGE ABSCESS;  Surgeon: Robert Bellow, MD;  Location: [facility] ORS;  Service: General;  Laterality: N/A;   INCISION AND DRAINAGE PERIRECTAL ABSCESS Right 06/16/2015   Procedure: IRRIGATION AND DEBRIDEMENT right inner thigh ABSCESS;  Surgeon: Florene Glen, MD;  Location: [facility] ORS;  Service: General;  Laterality: Right;  INSERTION OF MESH N/A 12/20/2017   Procedure: INSERTION OF MESH;  Surgeon: [name], MD;  Location: [facility] ORS;  Service: General;  Laterality: N/A;   KNEE ARTHROSCOPY WITH ANTERIOR CRUCIATE LIGAMENT (ACL) REPAIR Left 2002   KNEE SURGERY Left    PILONIDAL CYST EXCISION N/A 05/02/2020    Procedure: CYST EXCISION PILONIDAL EXTENSIVE;  Surgeon: Robert Bellow, MD;  Location: [facility] ORS;  Service: General;  Laterality: N/A;   UMBILICAL HERNIA REPAIR N/A 12/20/2017   Procedure: HERNIA REPAIR UMBILICAL ADULT;  Surgeon: [name], MD;  Location: [facility] ORS;  Service: General;  Laterality: N/A;     FAMILY HISTORY:  Family History  Problem Relation Age of Onset   Heart disease Father 48   Cancer Father 84       Lung Cancer   Cancer Paternal Grandfather 38       in the bones     SOCIAL HISTORY:  reports that he has been smoking cigarettes. He has a 60.00 pack-year smoking history. He has never used smokeless tobacco. He reports that he does not currently use alcohol. He reports that he does not use drugs.  Patient is single and lives in [place].   ALLERGIES: Patient has no known allergies.   MEDICATIONS:  No current facility-administered medications for this visit.   No current outpatient medications on file.   Facility-Administered Medications Ordered in Other Visits  Medication Dose Route Frequency Provider Last Rate Last Admin   acetaminophen (TYLENOL) tablet 650 mg  650 mg Oral Q6H PRN Nicoletta Dress, Na, MD   650 mg at 04/28/22 2035   Or   acetaminophen (TYLENOL) suppository 650 mg  650 mg Rectal Q6H PRN Nicoletta Dress, Na, MD       ceFAZolin (ANCEF) IVPB 2g/100 mL premix  2 g Intravenous 30 min Pre-Op [name], MD       guaiFENesin-dextromethorphan (ROBITUSSIN DM) 100-10 MG/5ML syrup 5 mL  5 mL Oral Q4H PRN Nicoletta Dress, Na, MD   5 mL at 04/29/22 0454   morphine (PF) 2 MG/ML injection 2 mg  2 mg Intravenous Q4H PRN Nicoletta Dress, Na, MD   2 mg at 04/29/22 0313   ondansetron (ZOFRAN) tablet 4 mg  4 mg Oral Q6H PRN Charlann Lange, MD       Or   ondansetron (ZOFRAN) injection 4 mg  4 mg Intravenous Q6H PRN Nicoletta Dress, Na, MD       sodium chloride flush (NS) 0.9 % injection 10 mL  10 mL Intrapleural Q8H Li, Na, MD   10 mL at 04/29/22 0112     REVIEW OF SYSTEMS: On review of systems, the  patient reports that he is doing ***     PHYSICAL EXAM:  Wt Readings from Last 3 Encounters:  04/28/22 232 lb 12.9 oz (105.6 kg)  04/26/22 233 lb 1.6 oz (105.7 kg)  12/16/21 225 lb 7.4 oz (102.3 kg)   Temp Readings from Last 3 Encounters:  04/29/22 97.9 F (36.6 C) (Oral)  04/28/22 97.7 F (36.5 C) (Oral)  04/26/22 98.2 F (36.8 C) (Oral)   BP Readings from Last 3 Encounters:  04/29/22 (!) 155/99  04/28/22 (!) 162/105  04/26/22 (!) 150/92   Pulse Readings from Last 3 Encounters:  04/29/22 68  04/28/22 74  04/26/22 79    /10  In general this is a well appearing Caucasian male in no acute distress.  He's alert and oriented x4 and appropriate throughout the examination. Cardiopulmonary assessment is negative for  acute distress and he exhibits normal effort.     ECOG = ***  0 - Asymptomatic (Fully active, able to carry on all predisease activities without restriction)  1 - Symptomatic but completely ambulatory (Restricted in physically strenuous activity but ambulatory and able to carry out work of a light or sedentary nature. For example, light housework, office work)  2 - Symptomatic, <50% in bed during the day (Ambulatory and capable of all self care but unable to carry out any work activities. Up and about more than 50% of waking hours)  3 - Symptomatic, >50% in bed, but not bedbound (Capable of only limited self-care, confined to bed or chair 50% or more of waking hours)  4 - Bedbound (Completely disabled. Cannot carry on any self-care. Totally confined to bed or chair)  5 - Death   Eustace Pen MM, Creech RH, Tormey DC, et al. [phone]). "Toxicity and response criteria of the [facility] [employer]". [place] Oncol. 5 (6): 649-55    LABORATORY DATA:  Lab Results  Component Value Date   WBC 9.9 04/29/2022   HGB 13.1 04/29/2022   HCT 40.5 04/29/2022   MCV 81.0 04/29/2022   PLT 380 04/29/2022   Lab Results  Component Value Date   NA 137 04/29/2022    K 3.5 04/29/2022   CL 105 04/29/2022   CO2 24 04/29/2022   Lab Results  Component Value Date   ALT 13 04/28/2022   AST 20 04/28/2022   ALKPHOS 76 04/28/2022   BILITOT 1.0 04/28/2022      RADIOGRAPHY: DG Chest Port 1 View  Result Date: 04/28/2022 CLINICAL DATA:  Chest tube placement, post tube placement for pneumothorax. EXAM: PORTABLE CHEST 1 VIEW COMPARISON:  Chest CT April 28, 2022. FINDINGS: Post placement of RIGHT-sided chest tube. Tube coiled in the RIGHT upper chest Re-expansion of the RIGHT lung. Perhaps very small pneumothorax at the RIGHT lung apex. Presence of blebs in this region makes assessment difficult. Lateral and basilar component has resolved. Perhaps minimal volume loss along the minor fissure in the RIGHT chest persists. Bullous disease in the LEFT chest at the LEFT lung apex as well. LEFT chest is clear otherwise. On limited assessment there is no acute skeletal process. IMPRESSION: 1. Post placement of RIGHT-sided chest tube with reexpansion of the RIGHT lung as described above. Perhaps very small pneumothorax at the RIGHT lung apex. 2. Bullous disease in the LEFT lung apex. Electronically Signed   By: Zetta Bills M.D.   On: 04/28/2022 17:41   CT CHEST WO CONTRAST  Result Date: 04/28/2022 CLINICAL DATA:  Pneumothorax EXAM: CT CHEST WITHOUT CONTRAST TECHNIQUE: Multidetector CT imaging of the chest was performed following the standard protocol without IV contrast. RADIATION DOSE REDUCTION: This exam was performed according to the departmental dose-optimization program which includes automated exposure control, adjustment of the mA and/or kV according to patient size and/or use of iterative reconstruction technique. COMPARISON:  Same day chest radiograph. FINDINGS: Cardiovascular: Coronary artery atherosclerosis. Normal heart size. No pericardial effusion. Mediastinum/Nodes: No enlarged mediastinal or axillary lymph nodes. Thyroid gland, trachea, and esophagus demonstrate  no significant findings. Lungs/Pleura: Moderate emphysema with apical blebs. Moderate to large right pneumothorax. Scarring/atelectasis in the right upper lobe. No consolidation. No sizable pleural effusions. Upper Abdomen: No acute findings. Small gallstones, partially imaged. Diverticulosis. Musculoskeletal: No fracture is seen. IMPRESSION: 1. Moderate to large right pneumothorax. 2. Scarring/atelectasis in the right upper lobe. 3. Emphysema (ICD10-J43.9) with apical blebs. Electronically Signed   By:  Margaretha Sheffield M.D.   On: 04/28/2022 12:57   DG Chest 2 View  Result Date: 04/28/2022 CLINICAL DATA:  Shortness of breath EXAM: CHEST - 2 VIEW COMPARISON:  05/24/2021 FINDINGS: Moderate right pneumothorax seen laterally and apically, at least 20%. Chronic interstitial coarsening with apical emphysema and bullae. Normal heart size. Aortic tortuosity. These results were called by telephone at the time of interpretation on 04/28/2022 at 10:00 am to provider [facility] , who verbally acknowledged these results. IMPRESSION: 1. Moderate right pneumothorax, at least 20%. 2. Emphysema with apical blebs. Electronically Signed   By: Jorje Guild M.D.   On: 04/28/2022 10:23       IMPRESSION/PLAN: 1. Stage I, cT1N0M0, adenocarcinoma of the distal esophagus. I reveiwed the pathology findings and course to date. We discussed the standard of care for curative intent would be to proceed with surgical resection. If he does not proceed in this manner, ***    We discussed the risks, benefits, short, and long term effects of radiotherapy, as well as the curative intent, and the patient is interested in proceeding. I discussed the delivery and logistics of radiotherapy and anticipates a course of 5 1/2 weeks of radiotherapy to the distal esophagus. ***   In a visit lasting *** minutes, greater than 50% of the time was spent face to face discussing the patient's condition, in preparation for the discussion, and  coordinating the patient's care.    ________________________________   Jodelle Gross, MD, PhD    **Disclaimer: This note was dictated with voice recognition software. Similar sounding words can inadvertently be transcribed and this note may contain transcription errors which may not have been corrected upon publication of note.**

## 2022-04-29 NOTE — Discharge Instructions (Addendum)
Thoracoscopy, Care After The following information offers guidance on how to care for yourself after your procedure. Your health care provider may also give you more specific instructions. If you have problems or questions, contact your health care provider. What can I expect after the procedure? After the procedure, it is common to have pain and soreness in the surgical area. Follow these instructions at home: Incision care  Follow instructions from your health care provider about how to take care of your incisions. Make sure you: Wash your hands with soap and water for at least 20 seconds before and after you change your bandage (dressing). If soap and water are not available, use hand sanitizer. Change your dressing as told by your health care provider. Leave stitches (sutures), staples, skin glue, or adhesive strips in place. These skin closures may need to stay in place for 2 weeks or longer. If adhesive strip edges start to loosen and curl up, you may trim the loose edges. Do not remove adhesive strips completely unless your health care provider tells you to do that. Check your incision areas every day for signs of infection. Check for: More redness, swelling, or pain. Fluid or blood. Warmth. Pus or a bad smell. Do not take baths, swim, or use a hot tub until your health care provider approves. You may take showers. Medicines Take over-the-counter and prescription medicines only as told by your health care provider. If you were prescribed an antibiotic medicine, take it as told by your health care provider. Do not stop taking the antibiotic even if you start to feel better. Ask your health care provider if the medicine prescribed to you: Requires you to avoid driving or using machinery. Can cause constipation. You may need to take these actions to prevent or treat constipation: Drink enough fluid to keep your urine pale yellow. Take over-the-counter or prescription medicines. Eat  foods that are high in fiber, such as beans, whole grains, and fresh fruits and vegetables. Limit foods that are high in fat and processed sugars, such as fried or sweet foods. Preventing lung infection To prevent pneumonia and to keep your lungs healthy: Try to cough often. If it hurts to cough, hold a pillow against your chest as you cough. Take deep breaths or do breathing exercises as told by your health care provider. If you were given an incentive spirometer, use it as told by your health care provider. General instructions You may have to avoid lifting. Ask your health care provider how much you can safely lift. Do not use any products that contain nicotine or tobacco. These products include cigarettes, chewing tobacco, and vaping devices, such as e-cigarettes. These can delay healing after the procedure. If you need help quitting, ask your health care provider. Avoid driving until your health care provider approves. If you have a chest drainage tube, care for it as told by your health care provider. Do not travel by airplane after the chest drainage tube is removed until your health care provider approves. Keep all follow-up visits. This is important. Contact a health care provider if: You have a fever. Pain medicines do not ease your pain. You have more redness, swelling, or pain around your incision area. You have fluid or blood coming from your incision area. An incision feels warm to the touch. You develop a cough that does not go away, or you are coughing up mucus that is yellow or green. You have pus or a bad smell coming from an incision or  dressing. Get help right away if: You cough up blood. You develop light-headedness, or you feel faint. You have difficulty breathing or an increased shortness of breath. You develop chest pain. Your heartbeat feels irregular or very fast. These symptoms may be an emergency. Get help right away. Call 911. Do not wait to see if the  symptoms will go away. Do not drive yourself to the hospital. Summary Follow instructions from your health care provider about how to take care of your incisions. Ask your health care provider if the medicine prescribed to you requires you to avoid driving or using machinery. Leave stitches (sutures), staples, skin glue, or adhesive strips in place. Check your incision areas every day for signs of infection. This information is not intended to replace advice given to you by your health care provider. Make sure you discuss any questions you have with your health care provider. Document Revised: 01/07/2021 Document Reviewed: 01/07/2021 Elsevier Patient Education  [place].

## 2022-04-30 ENCOUNTER — Inpatient Hospital Stay (HOSPITAL_COMMUNITY): Payer: BC Managed Care – PPO

## 2022-04-30 ENCOUNTER — Encounter (HOSPITAL_COMMUNITY): Payer: Self-pay | Admitting: Thoracic Surgery (Cardiothoracic Vascular Surgery)

## 2022-04-30 DIAGNOSIS — C155 Malignant neoplasm of lower third of esophagus: Secondary | ICD-10-CM | POA: Diagnosis not present

## 2022-04-30 DIAGNOSIS — J9312 Secondary spontaneous pneumothorax: Secondary | ICD-10-CM | POA: Diagnosis not present

## 2022-04-30 DIAGNOSIS — Z862 Personal history of diseases of the blood and blood-forming organs and certain disorders involving the immune mechanism: Secondary | ICD-10-CM | POA: Diagnosis not present

## 2022-04-30 DIAGNOSIS — I10 Essential (primary) hypertension: Secondary | ICD-10-CM | POA: Diagnosis not present

## 2022-04-30 LAB — BASIC METABOLIC PANEL
Anion gap: 10 (ref 5–15)
BUN: 17 mg/dL (ref 6–20)
CO2: 22 mmol/L (ref 22–32)
Calcium: 8.8 mg/dL — ABNORMAL LOW (ref 8.9–10.3)
Chloride: 103 mmol/L (ref 98–111)
Creatinine, Ser: 1.26 mg/dL — ABNORMAL HIGH (ref 0.61–1.24)
GFR, Estimated: >60 mL/min (ref >60)
Glucose, Bld: 143 mg/dL — ABNORMAL HIGH (ref 70–99)
Potassium: 4.4 mmol/L (ref 3.5–5.1)
Sodium: 135 mmol/L (ref 135–145)

## 2022-04-30 LAB — CBC
HCT: 38.7 % — ABNORMAL LOW (ref 39.0–52.0)
Hemoglobin: 12.5 g/dL — ABNORMAL LOW (ref 13.0–17.0)
MCH: 26.3 pg (ref 26.0–34.0)
MCHC: 32.3 g/dL (ref 30.0–36.0)
MCV: 81.3 fL (ref 80.0–100.0)
Platelets: 367 10*3/uL (ref 150–400)
RBC: 4.76 MIL/uL (ref 4.22–5.81)
RDW: 16.3 % — ABNORMAL HIGH (ref 11.5–15.5)
WBC: 16 10*3/uL — ABNORMAL HIGH (ref 4.0–10.5)
nRBC: 0 % (ref 0.0–0.2)

## 2022-04-30 LAB — MAGNESIUM: Magnesium: 2.1 mg/dL (ref 1.7–2.4)

## 2022-04-30 MED ORDER — MORPHINE SULFATE (PF) 2 MG/ML IV SOLN
2.0000 mg | INTRAVENOUS | Status: DC | PRN
  Administered 2022-04-30 – 2022-05-02 (×7): 2 mg via INTRAVENOUS
  Filled 2022-04-30 (×7): qty 1

## 2022-04-30 MED ORDER — ALUM & MAG HYDROXIDE-SIMETH 200-200-20 MG/5ML PO SUSP
30.0000 mL | ORAL | Status: DC | PRN
  Administered 2022-04-30 (×2): 30 mL via ORAL
  Filled 2022-04-30 (×2): qty 30

## 2022-04-30 MED ORDER — BENZONATATE 100 MG PO CAPS: 100.0000 mg | ORAL_CAPSULE | Freq: Three times a day (TID) | ORAL | Status: DC | PRN

## 2022-04-30 MED ORDER — SODIUM CHLORIDE 0.9 % IV SOLN
12.5000 mg | Freq: Four times a day (QID) | INTRAVENOUS | Status: DC | PRN
  Administered 2022-04-30: 12.5 mg via INTRAVENOUS
  Filled 2022-04-30 (×2): qty 0.5

## 2022-04-30 MED ORDER — BACLOFEN 10 MG PO TABS
5.0000 mg | ORAL_TABLET | Freq: Three times a day (TID) | ORAL | Status: DC
  Administered 2022-04-30 – 2022-05-02 (×5): 5 mg via ORAL
  Filled 2022-04-30 (×5): qty 1

## 2022-04-30 NOTE — Op Note (Signed)
NAMECABOT, CROMARTIE MEDICAL RECORD NO: [id number] ACCOUNT NO: [id number] DATE OF BIRTH: 03/19/1971 FACILITY: [facility] LOCATION: [facility] PHYSICIAN: Revonda Standard. Roxan Hockey, MD  Operative Report   DATE OF PROCEDURE: 04/29/2022  PREOPERATIVE DIAGNOSIS:  Spontaneous pneumothorax.  POSTOPERATIVE DIAGNOSIS:  Spontaneous pneumothorax.  PROCEDURE:  Right video-assisted thoracoscopy, stapling of blebs.  SURGEON:  Revonda Standard. Roxan Hockey, MD  ASSISTANT:  Enid Cutter, PA  ANESTHESIA:  General.  FINDINGS:  Large blebs on the middle lobe, large bull and mass of blebs at the apex, lower lobe relatively spared.  CLINICAL NOTE:  Mr. 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  He presented with a spontaneous pneumothorax.  CT showed the pneumothorax and showed multiple very large  blebs. A tube was placed to stabilize the patient in the emergency room.  He was advised to undergo surgical bleb resection because of the high risk for recurrent pneumothorax.  Plan was not to do pleurodesis due to the potential need for  surgical access for esophageal procedure.  The indications, risks, benefits, and alternatives were discussed in detail with the patient.  He understood the risks. He did understand that there was an increase in the risk of recurrence due to no pleurectomy or pleural abrasion.  OPERATIVE NOTE:  Mr. Lasky was brought to the preoperative holding area on 04/29/2022.  Anesthesia established intravenous access and placed an arterial blood pressure monitoring line.  He was taken to the operating room, anesthetized and intubated  with a double lumen endotracheal tube.  Intravenous antibiotics were administered.  Sequential compression devices were placed on the calves for DVT prophylaxis.  He was placed in a left lateral decubitus position.  A Bair Hugger was placed for active warming.  Prior to prepping and draping,  single lung ventilation was initiated and the preexisting pigtail catheter was removed.  The right chest was prepped and draped in the usual sterile fashion.   A timeout was performed.  An incision was made in the eighth interspace in the mid axillary line, and a 5 mm port was inserted.  The thoracoscope was advanced into the chest.  Immediately on placing the thoracoscope, there were large blebs visible  inferiorly.  There was good isolation of the right lung, although it was relatively slow to deflate and suction was applied via the endotracheal tube.  A 4 cm working incision was made in the sixth interspace anterolaterally.  No rib spreading was  performed.  Inspection of the lower lobe revealed it was relatively spared with no obvious blebs. The middle lobe had two large blebs.  These were removed by stapling using a Covidien stapler with both purple and black cartridges.  All cartridges were  reinforced with polyglycolic acid material.  A second larger bleb was seen arising from the middle lobe.  This bleb was so large, it had to be deflated prior to stapling.  Next, the apex was addressed.  There was a large bulla at the apex.  There  were adhesions to the chest wall superiorly.  The adhesions were taken down with cautery.  The apical bulla then was removed with multiple firings of the stapler using both purple and primarily black reinforced cartridges.  After that specimen was removed, there was a smaller bleb adjacent to it that was resected as well.  The chest was copiously irrigated with saline.  A test inflation showed no obvious air leaks, however, a small bleb was noted along the middle  lobe.  This area was stapled with the Covidien stapler as well.   There was good hemostasis at all staple lines.  A 28-French Blake drain was placed through the eighth interspace incision and secured with #1 silk suture.  Dual lung ventilation was resumed.  The working incision was closed in standard fashion.  The   chest tube was placed to a Pleur-Evac on suction.  The patient then was extubated in the operating room and taken to the postanesthetic care unit in good condition.  All sponge, needle and instrument counts were correct at the end of the procedure.  Experienced assistance was necessary for this case.  Enid Cutter assisted with incisions, camera work, retraction of delicate tissues and wound closure.   NIK D: 04/29/2022 3:13:36 pm T: 04/30/2022 12:13:00 am  JOB: [id number]/ [id number]

## 2022-04-30 NOTE — Anesthesia Postprocedure Evaluation (Signed)
Anesthesia Post Note  Patient: Bruce Graham  Procedure(s) Performed: RIGHT VIDEO ASSISTED THORACOSCOPY (VATS)/ STAPELING OF BLEBS (Right: Chest)     Patient location during evaluation: PACU Anesthesia Type: General Level of consciousness: sedated and patient cooperative Pain management: pain level controlled Vital Signs Assessment: post-procedure vital signs reviewed and stable Respiratory status: spontaneous breathing Cardiovascular status: stable Anesthetic complications: no   No notable events documented.  Last Vitals:  Vitals:   04/30/22 0517 04/30/22 0722  BP: 112/68 121/73  Pulse: 63 72  Resp: 17 16  Temp: 36.8 C 36.6 C  SpO2: 93% 96%    Last Pain:  Vitals:   04/30/22 0722  TempSrc: Oral  PainSc:    Pain Goal: Patients Stated Pain Goal: 0 (04/29/22 2125)                 Nolon Nations

## 2022-04-30 NOTE — Progress Notes (Signed)
1 Day Post-Op Procedure(s) (LRB): RIGHT VIDEO ASSISTED THORACOSCOPY (VATS)/ STAPELING OF BLEBS (Right) Subjective: Some incisional pain but primary complaint is hiccups  Objective: Vital signs in last 24 hours: Temp:  [97.5 F (36.4 C)-98.2 F (36.8 C)] 97.8 F (36.6 C) (11/03 0722) Pulse Rate:  [63-92] 72 (11/03 0722) Cardiac Rhythm: Heart block (11/02 1914) Resp:  [16-25] 16 (11/03 0722) BP: (112-158)/(68-106) 121/73 (11/03 0722) SpO2:  [89 %-98 %] 96 % (11/03 0722) Weight:  [105.6 kg] 105.6 kg (11/02 0859)  Hemodynamic parameters for last 24 hours:    Intake/Output from previous day: 11/02 0701 - 11/03 0700 In: 1230 [P.O.:320; I.V.:800; IV Piggyback:100] Out: 2420 [Urine:2250; Blood:25; Chest Tube:145] Intake/Output this shift: No intake/output data recorded.  General appearance: alert, cooperative, and no distress Neurologic: intact Heart: regular rate and rhythm Lungs: clear to auscultation bilaterally + small air leak with cough  Lab Results: Recent Labs    04/29/22 0324 04/30/22 0241  WBC 9.9 16.0*  HGB 13.1 12.5*  HCT 40.5 38.7*  PLT 380 367   BMET:  Recent Labs    04/29/22 0324 04/30/22 0241  NA 137 135  K 3.5 4.4  CL 105 103  CO2 24 22  GLUCOSE 89 143*  BUN 9 17  CREATININE 1.09 1.26*  CALCIUM 8.8* 8.8*    PT/INR: No results for input(s): "LABPROT", "INR" in the last 72 hours. ABG No results found for: "PHART", "HCO3", "TCO2", "ACIDBASEDEF", "O2SAT" CBG (last 3)  No results for input(s): "GLUCAP" in the last 72 hours.  Assessment/Plan: S/P Procedure(s) (LRB): RIGHT VIDEO ASSISTED THORACOSCOPY (VATS)/ STAPELING OF BLEBS (Right) POD # 1 Overall doing well Has a very small air leak with cough- will keep CT to suction today Pain is well controlled Creatinine up slightly- stop Toradol Thorazine PRN for hiccups Ambulate, SCD + enoxaparin for DVT prophylaxis Leukocytosis likely reactive postop   LOS: 1 day    Bruce Graham 04/30/2022

## 2022-04-30 NOTE — Plan of Care (Signed)
  Problem: Education: Goal: Knowledge of General Education information will improve Description Including pain rating scale, medication(s)/side effects and non-pharmacologic comfort measures Outcome: Progressing   Problem: Health Behavior/Discharge Planning: Goal: Ability to manage health-related needs will improve Outcome: Progressing   

## 2022-04-30 NOTE — Progress Notes (Signed)
Mobility Specialist - Progress Note   04/30/22 0900  Mobility  Activity Ambulated with assistance in hallway  Level of Assistance Modified independent, requires aide device or extra time  Assistive Device Other (Comment) (IV Pole)  Distance Ambulated (ft) 250 ft  Activity Response Tolerated well  $Mobility charge 1 Mobility    Pt received in bed agreeable to mobility. No complaints throughout. Left in bed w/ call bell and all needs met.   Paulla Dolly Mobility Specialist

## 2022-04-30 NOTE — Telephone Encounter (Signed)
HFU scheduled for 12/6. Reminder mailed.

## 2022-04-30 NOTE — Progress Notes (Signed)
PROGRESS NOTE    Bruce Graham  NTI:[id number] DOB: January 06, 1971 DOA: 04/28/2022 PCP: Crecencio Mc, MD    Brief Narrative:  Bruce Graham is a 51 y.o. male with medical history significant of probable COPD, not on home O2, tobacco abuse, GERD, hypertension, hyperlipidemia presented to the hospital with cough, shortness of breath and pleuritic chest pain.  History of cigarette smoking.  He had sudden onset of shortness of breath and pleuritic chest pain after his chronic cough.  He did initially go to a local urgent care and was noted to have pneumothorax and was sent to our hospital.  In the ED, patient was afebrile, elevated blood pressure was noted and oxygen saturation was 94 to 98% on room air.  WBC was 11.8.  Chest x-ray showed emphysema with apical blebs with moderate right pneumothorax.  CT chest showed moderate to large right pneumothorax.  Pulmonary was consulted and patient was admitted hospital for further evaluation and treatment.   Assessment and plan.  Principal Problem:   Pneumothorax Active Problems:   Essential hypertension   Tobacco abuse   History of leukocytosis   Esophageal cancer (HCC)   Leukocytosis   COPD with acute exacerbation (HCC)   Moderate oderate to large right-sided pleural pneumothorax #COPD with emphysematous blebs #Tobacco abuse Patient was seen by critical care and underwent chest tube insertion on 04/28/2022.   CT surgery was consulted due to emphysematous bleb and patient underwent video-assisted thoracoscopic stapling of blebs of the right upper and middle lobe blebs on 04/29/2022.  .  Currently 2 L of oxygen by nasal cannula.  MRSA PCR negative.  Complains of cough and hiccups.Further management plan as per CT surgery.  Patient will need to follow-up with pulmonary as outpatient in 4 to 6 weeks.  Hiccups.  Thorazine has been ordered.  Continue symptomatic care.  Essential hypertension Not on medications at home.  We will continue to monitor.   Blood pressure is controlled at this time.  Hyperlipidemia.   Continue Zetia.  Leukocytosis likely reactive-postop status. Latest WBC at 16.0 from 11.8.  We will continue to monitor.  # Esophageal cancer, cT1N0M0, stage MMR normal  # h/o Barrett's esophagus. Follow-up with oncology as outpatient.    DVT prophylaxis: enoxaparin (LOVENOX) injection 40 mg Start: 04/30/22 0600 SCD's Start: 04/29/22 1811 SCDs Start: 04/28/22 1821   Code Status:     Code Status: Full Code  Disposition: Home  Status is: Inpatient  Remains inpatient appropriate because: Status post chest tube placement, status post pulmonary intervention, need for further monitoring,   Family Communication:  Spoke with the patient's sister at bedside on 04/29/2022.  Consultants:  Pulmonary Cardiothoracic surgery  Procedures:  Right-sided chest tube placement 04/28/2022. VATS with staples in the emphysematous blebs on 04/29/2022  Antimicrobials:  None currently  Subjective: Today, patient was seen and examined at bedside.  Patient complains of hiccups and cough.  Was able to ambulate and took a shower today.  Denies any fever, chills or rigor.   Objective: Vitals:   04/29/22 2130 04/29/22 2333 04/30/22 0517 04/30/22 0722  BP: (!) 145/95 127/74 112/68 121/73  Pulse: 78 71 63 72  Resp:  _0 Temp: 98.1 F (36.7 C) 97.8 F (36.6 C) 98.2 F (36.8 C) 97.8 F (36.6 C)  TempSrc: Oral Oral  Oral  SpO2: 98% 94% 93% 96%  Weight:      Height:        Intake/Output Summary (Last 24 hours) at 04/30/2022 0738 Last  data filed at 04/30/2022 0640 Gross per 24 hour  Intake 1230 ml  Output 1820 ml  Net -590 ml    Filed Weights   04/28/22 2100 04/29/22 0859  Weight: 105.6 kg 105.6 kg    Physical Examination: Body mass index is 33.4 kg/m.   General: Obese built, not in obvious distress, intermittently cooperative HENT:   No scleral pallor or icterus noted. Oral mucosa is moist.  Chest: Right-sided  chest tube in place, diminished breath sounds, anterior chest wall scar. CVS: S1 &S2 heard. No murmur.  Regular rate and rhythm. Abdomen: Soft, nontender, nondistended.  Bowel sounds are heard.   Extremities: No cyanosis, clubbing or edema.  Peripheral pulses are palpable. Psych: Alert, awake and oriented, normal mood CNS:  No cranial nerve deficits.  Power equal in all extremities.   Skin: Warm and dry.  No rashes noted.  Data Reviewed:   CBC: Recent Labs  Lab 04/28/22 1155 04/28/22 1818 04/29/22 0324 04/30/22 0241  WBC 11.8* 10.4 9.9 16.0*  NEUTROABS 7.5  --   --   --   HGB 14.7 14.1 13.1 12.5*  HCT 44.1 43.1 40.5 38.7*  MCV 80.8 80.4 81.0 81.3  PLT 438* 405* 380 367     Basic Metabolic Panel: Recent Labs  Lab 04/28/22 1155 04/28/22 1818 04/29/22 0324 04/30/22 0241  NA 139 139 137 135  K 3.8 3.5 3.5 4.4  CL 105 105 105 103  CO2 _0 GLUCOSE 109* 90 89 143*  BUN 5* _1 CREATININE 0.99 0.89 1.09 1.26*  CALCIUM 9.1 9.0 8.8* 8.8*  MG  --   --   --  2.1     Liver Function Tests: Recent Labs  Lab 04/28/22 1818  AST 20  ALT 13  ALKPHOS 76  BILITOT 1.0  PROT 7.8  ALBUMIN 3.3*      Radiology Studies: DG Chest Port 1 View  Result Date: 04/29/2022 CLINICAL DATA:  Status post thoracotomy, history of pneumothorax EXAM: PORTABLE CHEST 1 VIEW COMPARISON:  04/28/2022 FINDINGS: Single frontal view of the chest demonstrates interval postsurgical changes from partial right lung resection. Right chest tube tip at the right apex. There is a trace right apical pneumothorax, volume estimated less than 5%. Volume loss in the right hemithorax consistent with partial right pneumonectomy. No focal consolidation or effusion. IMPRESSION: 1. Postsurgical changes from partial right lung resection. 2. Trace right apical pneumothorax, with right-sided chest tube in place. Volume estimated less than 5%. These results will be called to the ordering clinician or representative  by the Radiologist Assistant, and communication documented in the PACS or [employer]. Electronically Signed   By: Randa Ngo M.D.   On: 04/29/2022 12:49   DG Chest Port 1 View  Result Date: 04/28/2022 CLINICAL DATA:  Chest tube placement, post tube placement for pneumothorax. EXAM: PORTABLE CHEST 1 VIEW COMPARISON:  Chest CT April 28, 2022. FINDINGS: Post placement of RIGHT-sided chest tube. Tube coiled in the RIGHT upper chest Re-expansion of the RIGHT lung. Perhaps very small pneumothorax at the RIGHT lung apex. Presence of blebs in this region makes assessment difficult. Lateral and basilar component has resolved. Perhaps minimal volume loss along the minor fissure in the RIGHT chest persists. Bullous disease in the LEFT chest at the LEFT lung apex as well. LEFT chest is clear otherwise. On limited assessment there is no acute skeletal process. IMPRESSION: 1. Post placement of RIGHT-sided chest tube with reexpansion of the RIGHT lung  as described above. Perhaps very small pneumothorax at the RIGHT lung apex. 2. Bullous disease in the LEFT lung apex. Electronically Signed   By: Zetta Bills M.D.   On: 04/28/2022 17:41   CT CHEST WO CONTRAST  Result Date: 04/28/2022 CLINICAL DATA:  Pneumothorax EXAM: CT CHEST WITHOUT CONTRAST TECHNIQUE: Multidetector CT imaging of the chest was performed following the standard protocol without IV contrast. RADIATION DOSE REDUCTION: This exam was performed according to the departmental dose-optimization program which includes automated exposure control, adjustment of the mA and/or kV according to patient size and/or use of iterative reconstruction technique. COMPARISON:  Same day chest radiograph. FINDINGS: Cardiovascular: Coronary artery atherosclerosis. Normal heart size. No pericardial effusion. Mediastinum/Nodes: No enlarged mediastinal or axillary lymph nodes. Thyroid gland, trachea, and esophagus demonstrate no significant findings. Lungs/Pleura: Moderate  emphysema with apical blebs. Moderate to large right pneumothorax. Scarring/atelectasis in the right upper lobe. No consolidation. No sizable pleural effusions. Upper Abdomen: No acute findings. Small gallstones, partially imaged. Diverticulosis. Musculoskeletal: No fracture is seen. IMPRESSION: 1. Moderate to large right pneumothorax. 2. Scarring/atelectasis in the right upper lobe. 3. Emphysema (ICD10-J43.9) with apical blebs. Electronically Signed   By: Margaretha Sheffield M.D.   On: 04/28/2022 12:57   DG Chest 2 View  Result Date: 04/28/2022 CLINICAL DATA:  Shortness of breath EXAM: CHEST - 2 VIEW COMPARISON:  05/24/2021 FINDINGS: Moderate right pneumothorax seen laterally and apically, at least 20%. Chronic interstitial coarsening with apical emphysema and bullae. Normal heart size. Aortic tortuosity. These results were called by telephone at the time of interpretation on 04/28/2022 at 10:00 am to provider [facility] , who verbally acknowledged these results. IMPRESSION: 1. Moderate right pneumothorax, at least 20%. 2. Emphysema with apical blebs. Electronically Signed   By: Jorje Guild M.D.   On: 04/28/2022 10:23      LOS: 1 day    Flora Lipps, MD [facility] Available via Epic secure chat 7am-7pm After these hours, please refer to coverage provider listed on [email or web address] 04/30/2022, 7:38 AM

## 2022-05-01 ENCOUNTER — Inpatient Hospital Stay (HOSPITAL_COMMUNITY): Payer: BC Managed Care – PPO

## 2022-05-01 DIAGNOSIS — C155 Malignant neoplasm of lower third of esophagus: Secondary | ICD-10-CM | POA: Diagnosis not present

## 2022-05-01 DIAGNOSIS — Z720 Tobacco use: Secondary | ICD-10-CM

## 2022-05-01 DIAGNOSIS — Z862 Personal history of diseases of the blood and blood-forming organs and certain disorders involving the immune mechanism: Secondary | ICD-10-CM

## 2022-05-01 DIAGNOSIS — J441 Chronic obstructive pulmonary disease with (acute) exacerbation: Secondary | ICD-10-CM

## 2022-05-01 DIAGNOSIS — I10 Essential (primary) hypertension: Secondary | ICD-10-CM

## 2022-05-01 DIAGNOSIS — J9312 Secondary spontaneous pneumothorax: Secondary | ICD-10-CM | POA: Diagnosis not present

## 2022-05-01 LAB — COMPREHENSIVE METABOLIC PANEL
ALT: 11 U/L (ref 0–44)
AST: 19 U/L (ref 15–41)
Albumin: 2.7 g/dL — ABNORMAL LOW (ref 3.5–5.0)
Alkaline Phosphatase: 52 U/L (ref 38–126)
Anion gap: 9 (ref 5–15)
BUN: 14 mg/dL (ref 6–20)
CO2: 24 mmol/L (ref 22–32)
Calcium: 8.3 mg/dL — ABNORMAL LOW (ref 8.9–10.3)
Chloride: 103 mmol/L (ref 98–111)
Creatinine, Ser: 0.95 mg/dL (ref 0.61–1.24)
GFR, Estimated: >60 mL/min (ref >60)
Glucose, Bld: 100 mg/dL — ABNORMAL HIGH (ref 70–99)
Potassium: 3.8 mmol/L (ref 3.5–5.1)
Sodium: 136 mmol/L (ref 135–145)
Total Bilirubin: 0.3 mg/dL (ref 0.3–1.2)
Total Protein: 6.6 g/dL (ref 6.5–8.1)

## 2022-05-01 LAB — CBC
HCT: 36.6 % — ABNORMAL LOW (ref 39.0–52.0)
Hemoglobin: 12 g/dL — ABNORMAL LOW (ref 13.0–17.0)
MCH: 26.3 pg (ref 26.0–34.0)
MCHC: 32.8 g/dL (ref 30.0–36.0)
MCV: 80.3 fL (ref 80.0–100.0)
Platelets: 355 10*3/uL (ref 150–400)
RBC: 4.56 MIL/uL (ref 4.22–5.81)
RDW: 16.4 % — ABNORMAL HIGH (ref 11.5–15.5)
WBC: 16.4 10*3/uL — ABNORMAL HIGH (ref 4.0–10.5)
nRBC: 0 % (ref 0.0–0.2)

## 2022-05-01 MED ORDER — NYSTATIN 100000 UNIT/ML MT SUSP
5.0000 mL | Freq: Four times a day (QID) | OROMUCOSAL | Status: DC
  Administered 2022-05-01 – 2022-05-02 (×3): 500000 [IU] via ORAL
  Filled 2022-05-01 (×3): qty 5

## 2022-05-01 MED ORDER — GABAPENTIN 100 MG PO CAPS
100.0000 mg | ORAL_CAPSULE | Freq: Three times a day (TID) | ORAL | Status: DC
  Administered 2022-05-01 – 2022-05-02 (×4): 100 mg via ORAL
  Filled 2022-05-01 (×4): qty 1

## 2022-05-01 MED ORDER — PHENOL 1.4 % MT LIQD: 1.0000 | OROMUCOSAL | Status: DC | PRN

## 2022-05-01 NOTE — Progress Notes (Signed)
PROGRESS NOTE    Bruce Graham  QPY:[id number] DOB: Feb 16, 1971 DOA: 04/28/2022 PCP: Crecencio Mc, MD    Brief Narrative:  Bruce Graham is a 51 y.o. male with medical history significant of probable COPD, not on home O2, tobacco abuse, GERD, hypertension, hyperlipidemia presented to the hospital with cough, shortness of breath and pleuritic chest pain.  History of cigarette smoking.  He had sudden onset of shortness of breath and pleuritic chest pain after his chronic cough.  He did initially go to a local urgent care and was noted to have pneumothorax and was sent to our hospital.  In the ED, patient was afebrile, elevated blood pressure was noted and oxygen saturation was 94 to 98% on room air.  WBC was 11.8.  Chest x-ray showed emphysema with apical blebs with moderate right pneumothorax.  CT chest showed moderate to large right pneumothorax.  Pulmonary was consulted and patient was admitted hospital for further evaluation and treatment.   During hospitalization, patient received chest tube placement followed by CT surgery intervention with stapling of the emphysematous blebs.  CT surgery following at this time.  Assessment and plan.  Principal Problem:   Pneumothorax Active Problems:   Essential hypertension   Tobacco abuse   History of leukocytosis   Esophageal cancer (HCC)   Leukocytosis   COPD with acute exacerbation (HCC)   Moderate oderate to large right-sided pleural pneumothorax #COPD with emphysematous blebs #Tobacco abuse Patient was seen by critical care and underwent chest tube insertion on 04/28/2022.   CT surgery was consulted due to emphysematous bleb and patient underwent video-assisted thoracoscopic stapling of blebs of the right upper and middle lobe blebs on 04/29/2022.   Status post chest tube removal.  Currently 1 L of oxygen by nasal cannula.  MRSA PCR negative. Patient will need to follow-up with pulmonary as outpatient in 4 to 6 weeks.  Patient might need  oxygen on discharge, remains hypoxic on ambulation.  CT surgery recommends chest x-ray on 05/02/2022 prior to plan on disposition.  Hiccups.  On Thorazine and baclofen.  Has improved at this time.    Resume low-dose gabapentin from home  Essential hypertension Not on medications at home.  We will continue to monitor.  Blood pressure is controlled at this time.  Hyperlipidemia.   Continue Zetia.  Leukocytosis likely reactive-postop status. Latest WBC at 16.4 from 11.8.  We will continue to monitor.  CBC in AM.  # Esophageal cancer, cT1N0M0, stage MMR normal  # h/o Barrett's esophagus. Follow-up with oncology as outpatient.    DVT prophylaxis: enoxaparin (LOVENOX) injection 40 mg Start: 04/30/22 0600 SCD's Start: 04/29/22 1811 SCDs Start: 04/28/22 1821   Code Status:     Code Status: Full Code  Disposition: Home likely on 05/02/2022  Status is: Inpatient  Remains inpatient appropriate because: Status post chest tube placement and removal, status post cardiothoracic intervention, need for further monitoring,   Family Communication:  Spoke with the patient's sister at bedside on 04/29/2022.  Consultants:  Pulmonary Cardiothoracic surgery  Procedures:  Right-sided chest tube placement 04/28/2022. VATS with staples in the emphysematous blebs on 04/29/2022  Antimicrobials:  None currently  Subjective: Today, patient was seen and examined at bedside.  Patient states that he feels better.  Was able to ambulate some.  Still hypoxic on ambulation.  Chest tube was removed today.  Hiccups got better.   Objective: Vitals:   04/30/22 1551 04/30/22 2135 05/01/22 0510 05/01/22 0808  BP: 132/80 138/77 (!) 140/83 136/80  Pulse: 67 74 65 82  Resp: _0 Temp: (!) 97.5 F (36.4 C) 98.3 F (36.8 C) 97.7 F (36.5 C) 97.7 F (36.5 C)  TempSrc: Oral Oral Oral Oral  SpO2: 97% 96% 93% 96%  Weight:      Height:        Intake/Output Summary (Last 24 hours) at 05/01/2022  0820 Last data filed at 05/01/2022 0500 Gross per 24 hour  Intake 120 ml  Output 1410 ml  Net -1290 ml    Filed Weights   04/28/22 2100 04/29/22 0859  Weight: 105.6 kg 105.6 kg    Physical Examination: Body mass index is 33.4 kg/m.   General: Obese built, not in obvious distress HENT:   No scleral pallor or icterus noted. Oral mucosa is moist.  Chest: Diminished breath sounds noted bilateral. CVS: S1 &S2 heard. No murmur.  Regular rate and rhythm. Abdomen: Soft, nontender, nondistended.  Bowel sounds are heard.   Extremities: No cyanosis, clubbing or edema.  Peripheral pulses are palpable. Psych: Alert, awake and oriented, normal mood CNS:  No cranial nerve deficits.  Power equal in all extremities.   Skin: Warm and dry.  No rashes noted.  Data Reviewed:   CBC: Recent Labs  Lab 04/28/22 1155 04/28/22 1818 04/29/22 0324 04/30/22 0241 05/01/22 0312  WBC 11.8* 10.4 9.9 16.0* 16.4*  NEUTROABS 7.5  --   --   --   --   HGB 14.7 14.1 13.1 12.5* 12.0*  HCT 44.1 43.1 40.5 38.7* 36.6*  MCV 80.8 80.4 81.0 81.3 80.3  PLT 438* 405* 380 367 355     Basic Metabolic Panel: Recent Labs  Lab 04/28/22 1155 04/28/22 1818 04/29/22 0324 04/30/22 0241 05/01/22 0312  NA 139 139 137 135 136  K 3.8 3.5 3.5 4.4 3.8  CL 105 105 105 103 103  CO2 _1 GLUCOSE 109* 90 89 143* 100*  BUN 5* _2 CREATININE 0.99 0.89 1.09 1.26* 0.95  CALCIUM 9.1 9.0 8.8* 8.8* 8.3*  MG  --   --   --  2.1  --      Liver Function Tests: Recent Labs  Lab 04/28/22 1818 05/01/22 0312  AST 20 19  ALT 13 11  ALKPHOS 76 52  BILITOT 1.0 0.3  PROT 7.8 6.6  ALBUMIN 3.3* 2.7*      Radiology Studies: DG Chest Port 1 View  Result Date: 04/30/2022 CLINICAL DATA:  Shortness of breath, status post right lung resection. EXAM: PORTABLE CHEST 1 VIEW COMPARISON:  April 29, 2022. FINDINGS: Stable cardiomediastinal silhouette. Stable position of right-sided chest tube with probable minimal  right apical pneumothorax. Small amount subcutaneous emphysema is seen over right lateral chest wall. Bony thorax is unremarkable. IMPRESSION: Stable position of right-sided chest tube with probable minimal right apical pneumothorax. Electronically Signed   By: Marijo Conception M.D.   On: 04/30/2022 08:20   DG Chest Port 1 View  Result Date: 04/29/2022 CLINICAL DATA:  Status post thoracotomy, history of pneumothorax EXAM: PORTABLE CHEST 1 VIEW COMPARISON:  04/28/2022 FINDINGS: Single frontal view of the chest demonstrates interval postsurgical changes from partial right lung resection. Right chest tube tip at the right apex. There is a trace right apical pneumothorax, volume estimated less than 5%. Volume loss in the right hemithorax consistent with partial right pneumonectomy. No focal consolidation or effusion. IMPRESSION: 1. Postsurgical changes from partial right lung resection. 2. Trace right apical pneumothorax, with  right-sided chest tube in place. Volume estimated less than 5%. These results will be called to the ordering clinician or representative by the Radiologist Assistant, and communication documented in the PACS or [employer]. Electronically Signed   By: Randa Ngo M.D.   On: 04/29/2022 12:49      LOS: 2 days    Flora Lipps, MD [facility] Available via Epic secure chat 7am-7pm After these hours, please refer to coverage provider listed on [email or web address] 05/01/2022, 8:20 AM

## 2022-05-01 NOTE — Progress Notes (Addendum)
      [address]       [place] [address]             [phone]      2 Days Post-Op Procedure(s) (LRB): RIGHT VIDEO ASSISTED THORACOSCOPY (VATS)/ STAPELING OF BLEBS (Right) Subjective: Says he is breathing okay.  Pain is well controlled.  Hiccups have resolved.  Objective: Vital signs in last 24 hours: Temp:  [97.5 F (36.4 C)-98.3 F (36.8 C)] 97.7 F (36.5 C) (11/04 0808) Pulse Rate:  [65-82] 82 (11/04 0808) Cardiac Rhythm: Normal sinus rhythm (11/03 2128) Resp:  [18-20] 18 (11/04 0808) BP: (132-140)/(77-83) 136/80 (11/04 0808) SpO2:  [93 %-97 %] 96 % (11/04 0808)  Hemodynamic parameters for last 24 hours:    Intake/Output from previous day: 11/03 0701 - 11/04 0700 In: 120 [P.O.:120] Out: 1410 [Urine:1000; Chest Tube:410] Intake/Output this shift: No intake/output data recorded.  General appearance: alert, cooperative, and no distress Neurologic: intact Lungs: Breath sounds clear, diminished.  No dyspnea.  Minimal drainage from the chest tube.  No airleak.  Chest x-ray is stable. Wound: The right chest minithoracotomy incision is well approximated and dry.  There is expected bruising.  Lab Results: Recent Labs    04/30/22 0241 05/01/22 0312  WBC 16.0* 16.4*  HGB 12.5* 12.0*  HCT 38.7* 36.6*  PLT 367 355   BMET:  Recent Labs    04/30/22 0241 05/01/22 0312  NA 135 136  K 4.4 3.8  CL 103 103  CO2 22 24  GLUCOSE 143* 100*  BUN 17 14  CREATININE 1.26* 0.95  CALCIUM 8.8* 8.3*    PT/INR: No results for input(s): "LABPROT", "INR" in the last 72 hours. ABG No results found for: "PHART", "HCO3", "TCO2", "ACIDBASEDEF", "O2SAT" CBG (last 3)  No results for input(s): "GLUCAP" in the last 72 hours.  Assessment/Plan: S/P Procedure(s) (LRB): RIGHT VIDEO ASSISTED THORACOSCOPY (VATS)/ STAPELING OF BLEBS (Right) -Postop day 2 VATS with stapling of multiple blebs after presenting with spontaneous pneumothorax.  Very small air leak yesterday,  no airleak is seen today.  Chest x-ray stable.  Plan chest tube removal and follow-up chest x-ray later today.     LOS: 2 days    Antony Odea, Hershal Coria [phone] 05/01/2022  Patient seen and examined, agree with above CT removed F/u CXR OK Will repeat CXR in AM  Carmyn Hamm C. Roxan Hockey, MD [facility] [phone]

## 2022-05-01 NOTE — Progress Notes (Signed)
Dressing over previous CT site saturated with drainage ( thin , yellowish).  Reinforced with abd pads and we will monitor.

## 2022-05-01 NOTE — Progress Notes (Addendum)
Chest tube removed and occlusive dressing applied. Will repeat the CXR at noon and if stable he may be discharged from CTS standpoint but will defer to the primary team. He remains on O2 to maintain adequate O2 sats.  Has scheduled office follow up on 04/2122 at 12:00.    M. Roddenberry, PA-C  Will check another CXR in AM tomorrow before clearing to go home  [place]. Roxan Hockey, MD [facility] [phone]

## 2022-05-01 NOTE — Progress Notes (Signed)
Complained of pain in his mouth, noticed some few white spots in upper and lower lips. NP Abigail notified. Medication given.

## 2022-05-02 ENCOUNTER — Inpatient Hospital Stay (HOSPITAL_COMMUNITY): Payer: BC Managed Care – PPO

## 2022-05-02 DIAGNOSIS — K22711 Barrett's esophagus with high grade dysplasia: Secondary | ICD-10-CM | POA: Diagnosis not present

## 2022-05-02 DIAGNOSIS — C155 Malignant neoplasm of lower third of esophagus: Secondary | ICD-10-CM | POA: Diagnosis not present

## 2022-05-02 DIAGNOSIS — Z720 Tobacco use: Secondary | ICD-10-CM | POA: Diagnosis not present

## 2022-05-02 DIAGNOSIS — J441 Chronic obstructive pulmonary disease with (acute) exacerbation: Secondary | ICD-10-CM | POA: Diagnosis not present

## 2022-05-02 DIAGNOSIS — J9312 Secondary spontaneous pneumothorax: Secondary | ICD-10-CM | POA: Diagnosis not present

## 2022-05-02 DIAGNOSIS — D72829 Elevated white blood cell count, unspecified: Secondary | ICD-10-CM

## 2022-05-02 LAB — BASIC METABOLIC PANEL
Anion gap: 7 (ref 5–15)
BUN: 8 mg/dL (ref 6–20)
CO2: 24 mmol/L (ref 22–32)
Calcium: 8.2 mg/dL — ABNORMAL LOW (ref 8.9–10.3)
Chloride: 103 mmol/L (ref 98–111)
Creatinine, Ser: 0.87 mg/dL (ref 0.61–1.24)
GFR, Estimated: >60 mL/min (ref >60)
Glucose, Bld: 102 mg/dL — ABNORMAL HIGH (ref 70–99)
Potassium: 3.6 mmol/L (ref 3.5–5.1)
Sodium: 134 mmol/L — ABNORMAL LOW (ref 135–145)

## 2022-05-02 LAB — CBC
HCT: 37.9 % — ABNORMAL LOW (ref 39.0–52.0)
Hemoglobin: 12.5 g/dL — ABNORMAL LOW (ref 13.0–17.0)
MCH: 26.4 pg (ref 26.0–34.0)
MCHC: 33 g/dL (ref 30.0–36.0)
MCV: 80.1 fL (ref 80.0–100.0)
Platelets: 395 10*3/uL (ref 150–400)
RBC: 4.73 MIL/uL (ref 4.22–5.81)
RDW: 16.4 % — ABNORMAL HIGH (ref 11.5–15.5)
WBC: 16.3 10*3/uL — ABNORMAL HIGH (ref 4.0–10.5)
nRBC: 0 % (ref 0.0–0.2)

## 2022-05-02 MED ORDER — PANTOPRAZOLE SODIUM 40 MG PO TBEC: 40.0000 mg | DELAYED_RELEASE_TABLET | Freq: Every day | ORAL | 0 refills | Status: DC

## 2022-05-02 MED ORDER — ALBUTEROL SULFATE HFA 108 (90 BASE) MCG/ACT IN AERS: 2.0000 | INHALATION_SPRAY | Freq: Four times a day (QID) | RESPIRATORY_TRACT | 2 refills | Status: DC | PRN

## 2022-05-02 MED ORDER — NYSTATIN 100000 UNIT/ML MT SUSP: 5.0000 mL | Freq: Four times a day (QID) | OROMUCOSAL | 0 refills | Status: DC

## 2022-05-02 MED ORDER — IPRATROPIUM-ALBUTEROL 0.5-2.5 (3) MG/3ML IN SOLN
3.0000 mL | Freq: Once | RESPIRATORY_TRACT | Status: AC
  Administered 2022-05-02: 3 mL via RESPIRATORY_TRACT
  Filled 2022-05-02: qty 3

## 2022-05-02 MED ORDER — HYDROCODONE-ACETAMINOPHEN 5-325 MG PO TABS: 1.0000 | ORAL_TABLET | Freq: Four times a day (QID) | ORAL | 0 refills | Status: DC | PRN

## 2022-05-02 MED ORDER — GUAIFENESIN-DM 100-10 MG/5ML PO SYRP: 5.0000 mL | ORAL_SOLUTION | ORAL | 0 refills | Status: DC | PRN

## 2022-05-02 NOTE — Progress Notes (Signed)
Nsg Discharge Note  Admit Date:  04/28/2022 Discharge date: 05/02/2022   Allena Katz to be D/C'd Home per MD order.  AVS completed. Patient/caregiver able to verbalize understanding.  Discharge Medication: Allergies as of 05/02/2022   No Known Allergies      Medication List     STOP taking these medications    baclofen 10 MG tablet Commonly known as: LIORESAL   ezetimibe 10 MG tablet Commonly known as: Zetia   gabapentin 300 MG capsule Commonly known as: NEURONTIN   mesalamine 1.2 g EC tablet Commonly known as: LIALDA   omeprazole 40 MG capsule Commonly known as: PRILOSEC   potassium chloride 20 MEQ/15ML (10%) Soln   predniSONE 10 MG tablet Commonly known as: DELTASONE       TAKE these medications    albuterol 108 (90 Base) MCG/ACT inhaler Commonly known as: VENTOLIN HFA Inhale 2 puffs into the lungs every 6 (six) hours as needed for wheezing or shortness of breath.   guaiFENesin-dextromethorphan 100-10 MG/5ML syrup Commonly known as: ROBITUSSIN DM Take 5 mLs by mouth every 4 (four) hours as needed for cough.   HYDROcodone-acetaminophen 5-325 MG tablet Commonly known as: NORCO/VICODIN Take 1 tablet by mouth every 6 (six) hours as needed for moderate pain.   MUCINEX ALLERGY PO Take 1 tablet by mouth every 8 (eight) hours as needed (cough).   nystatin 100000 UNIT/ML suspension Commonly known as: MYCOSTATIN Take 5 mLs (500,000 Units total) by mouth 4 (four) times daily.   pantoprazole 40 MG tablet Commonly known as: PROTONIX Take 1 tablet (40 mg total) by mouth daily. Start taking on: May 03, 2022        Discharge Assessment: Vitals:   05/02/22 0500 05/02/22 0847  BP: (!) 143/77 138/88  Pulse: 84 74  Resp: 18 16  Temp: 97.8 F (36.6 C) 97.9 F (36.6 C)  SpO2: 92% 92%   Skin clean, dry and intact without evidence of skin break down, no evidence of skin tears noted. IV catheter discontinued intact. Site without signs and symptoms of  complications - no redness or edema noted at insertion site, patient denies c/o pain - only slight tenderness at site.  Dressing with slight pressure applied.  D/c Instructions-Education: Discharge instructions given to patient/family with verbalized understanding. D/c education completed with patient/family including follow up instructions, medication list, d/c activities limitations if indicated, with other d/c instructions as indicated by MD - patient able to verbalize understanding, all questions fully answered. Patient instructed to return to ED, call 911, or call MD for any changes in condition.  Patient escorted via [place], and D/C home via private auto.  Atilano Ina, RN 05/02/2022 1:32 PM

## 2022-05-02 NOTE — Discharge Summary (Signed)
Physician Discharge Summary  Bruce Graham OHK:[id number] DOB: 01/22/1971 DOA: 04/28/2022  PCP: Crecencio Mc, MD  Admit date: 04/28/2022 Discharge date: 05/02/2022  Admitted From: Home  Discharge disposition: Home   Recommendations for Outpatient Follow-Up:   Follow up with your primary care provider in one week.  Check CBC, BMP, magnesium in the next visit Follow-up with Dr Erin Fulling pulmonary in 4 to 6 weeks  Follow-up with CT surgery as has been scheduled by the clinic.   Discharge Diagnosis:   Principal Problem:   Pneumothorax Active Problems:   Essential hypertension   Tobacco abuse   History of leukocytosis   Esophageal cancer (HCC)   Leukocytosis   COPD with acute exacerbation ([place])   Discharge Condition: Improved.  Diet recommendation: Low sodium, heart healthy.   Wound care: None.  Code status: Full.   History of Present Illness:   Bruce Graham is a 51 y.o. male with medical history significant of probable COPD, not on home O2, tobacco abuse, GERD, hypertension, hyperlipidemia presented to the hospital with cough, shortness of breath and pleuritic chest pain.  History of cigarette smoking.  He had sudden onset of shortness of breath and pleuritic chest pain after his chronic cough.  He did initially go to a local urgent care and was noted to have pneumothorax and was sent to our hospital.  In the ED, patient was afebrile, elevated blood pressure was noted and oxygen saturation was 94 to 98% on room air.  WBC was 11.8.  Chest x-ray showed emphysema with apical blebs with moderate right pneumothorax.  CT chest showed moderate to large right pneumothorax.  Pulmonary was consulted and patient was admitted hospital for further evaluation and treatment.   Hospital Course:   Following conditions were addressed during hospitalization as listed below,  Moderate oderate to large right-sided pleural pneumothorax #COPD with emphysematous blebs #Tobacco  abuse Patient was seen by critical care and underwent chest tube insertion on 04/28/2022.   CT surgery was consulted due to emphysematous bleb and patient underwent video-assisted thoracoscopic stapling of blebs of the right upper and middle lobe blebs on 04/29/2022.   Status post chest tube removal and patient has remained stable at this time.  CT surgery has seen the patient today and is stable for disposition home with outpatient follow-up on 05/18/2022 for suture removal.  Will empirically put the patient on albuterol inhaler on discharge.  Hiccups.  Improved after baclofen and Thorazine.    Oral thrush.  Mild.  We will continue nystatin on discharge.   Essential hypertension Not on medications at home.  We will continue to monitor.  Blood pressure is controlled at this time.  PCP to follow-up as outpatient.   Hyperlipidemia.   On Zetia as per the med rec but not taking it.  PCP to address as outpatient.   Leukocytosis likely reactive-postop status. Latest WBC at 16.3 from 16.4 <11.8.  Unlikely to be infection related.  Check CBC in the next PCP visit.   # Esophageal cancer, cT1N0M0, stage MMR normal  # h/o Barrett's esophagus. Follow-up with oncology as outpatient.  Continue Protonix on discharge.  Chronic smoking.  Patient was motivated to quit.  On nicotine lozenges.  Encouraged to quit   Disposition.  At this time, patient is stable for disposition home with outpatient PCP and pulmonary/cardiothoracic surgery follow-up.  Medical Consultants:   Pulmonary Cardiothoracic surgery  Procedures:    Right-sided chest tube placement 04/28/2022. VATS with staples in the emphysematous blebs on 04/29/2022  Subjective:   Today, patient was seen and examined at bedside patient feels okay.  Has mild pain but no shortness of breath.  Was able to ambulate well.  Mild cough but no dyspnea fever chills.  Discharge Exam:   Vitals:   05/02/22 0500 05/02/22 0847  BP: (!) 143/77 138/88  Pulse:  84 74  Resp: 18 16  Temp: 97.8 F (36.6 C) 97.9 F (36.6 C)  SpO2: 92% 92%    Body mass index is 33.4 kg/m.   General: Alert awake, not in obvious distress, obese built HENT: pupils equally reacting to light,  No scleral pallor or icterus noted. Oral mucosa is moist.  Chest:    Diminished breath sounds bilaterally. No crackles or wheezes.  Right chest wall dressing in place. CVS: S1 &S2 heard. No murmur.  Regular rate and rhythm. Abdomen: Soft, nontender, nondistended.  Bowel sounds are heard.   Extremities: No cyanosis, clubbing or edema.  Peripheral pulses are palpable. Psych: Alert, awake and oriented, normal mood CNS:  No cranial nerve deficits.  Power equal in all extremities.   Skin: Warm and dry.  No rashes noted.  The results of significant diagnostics from this hospitalization (including imaging, microbiology, ancillary and laboratory) are listed below for reference.     Diagnostic Studies:   DG Chest Port 1 View  Result Date: 04/29/2022 CLINICAL DATA:  Status post thoracotomy, history of pneumothorax EXAM: PORTABLE CHEST 1 VIEW COMPARISON:  04/28/2022 FINDINGS: Single frontal view of the chest demonstrates interval postsurgical changes from partial right lung resection. Right chest tube tip at the right apex. There is a trace right apical pneumothorax, volume estimated less than 5%. Volume loss in the right hemithorax consistent with partial right pneumonectomy. No focal consolidation or effusion. IMPRESSION: 1. Postsurgical changes from partial right lung resection. 2. Trace right apical pneumothorax, with right-sided chest tube in place. Volume estimated less than 5%. These results will be called to the ordering clinician or representative by the Radiologist Assistant, and communication documented in the PACS or [employer]. Electronically Signed   By: Randa Ngo M.D.   On: 04/29/2022 12:49   DG Chest Port 1 View  Result Date: 04/28/2022 CLINICAL DATA:  Chest tube  placement, post tube placement for pneumothorax. EXAM: PORTABLE CHEST 1 VIEW COMPARISON:  Chest CT April 28, 2022. FINDINGS: Post placement of RIGHT-sided chest tube. Tube coiled in the RIGHT upper chest Re-expansion of the RIGHT lung. Perhaps very small pneumothorax at the RIGHT lung apex. Presence of blebs in this region makes assessment difficult. Lateral and basilar component has resolved. Perhaps minimal volume loss along the minor fissure in the RIGHT chest persists. Bullous disease in the LEFT chest at the LEFT lung apex as well. LEFT chest is clear otherwise. On limited assessment there is no acute skeletal process. IMPRESSION: 1. Post placement of RIGHT-sided chest tube with reexpansion of the RIGHT lung as described above. Perhaps very small pneumothorax at the RIGHT lung apex. 2. Bullous disease in the LEFT lung apex. Electronically Signed   By: Zetta Bills M.D.   On: 04/28/2022 17:41   CT CHEST WO CONTRAST  Result Date: 04/28/2022 CLINICAL DATA:  Pneumothorax EXAM: CT CHEST WITHOUT CONTRAST TECHNIQUE: Multidetector CT imaging of the chest was performed following the standard protocol without IV contrast. RADIATION DOSE REDUCTION: This exam was performed according to the departmental dose-optimization program which includes automated exposure control, adjustment of the mA and/or kV according to patient size and/or use of iterative reconstruction technique.  COMPARISON:  Same day chest radiograph. FINDINGS: Cardiovascular: Coronary artery atherosclerosis. Normal heart size. No pericardial effusion. Mediastinum/Nodes: No enlarged mediastinal or axillary lymph nodes. Thyroid gland, trachea, and esophagus demonstrate no significant findings. Lungs/Pleura: Moderate emphysema with apical blebs. Moderate to large right pneumothorax. Scarring/atelectasis in the right upper lobe. No consolidation. No sizable pleural effusions. Upper Abdomen: No acute findings. Small gallstones, partially imaged.  Diverticulosis. Musculoskeletal: No fracture is seen. IMPRESSION: 1. Moderate to large right pneumothorax. 2. Scarring/atelectasis in the right upper lobe. 3. Emphysema (ICD10-J43.9) with apical blebs. Electronically Signed   By: Margaretha Sheffield M.D.   On: 04/28/2022 12:57   DG Chest 2 View  Result Date: 04/28/2022 CLINICAL DATA:  Shortness of breath EXAM: CHEST - 2 VIEW COMPARISON:  05/24/2021 FINDINGS: Moderate right pneumothorax seen laterally and apically, at least 20%. Chronic interstitial coarsening with apical emphysema and bullae. Normal heart size. Aortic tortuosity. These results were called by telephone at the time of interpretation on 04/28/2022 at 10:00 am to provider [facility] , who verbally acknowledged these results. IMPRESSION: 1. Moderate right pneumothorax, at least 20%. 2. Emphysema with apical blebs. Electronically Signed   By: Jorje Guild M.D.   On: 04/28/2022 10:23     Labs:   Basic Metabolic Panel: Recent Labs  Lab 04/28/22 1818 04/29/22 0324 04/30/22 0241 05/01/22 0312 05/02/22 0257  NA 139 137 135 136 134*  K 3.5 3.5 4.4 3.8 3.6  CL 105 105 103 103 103  CO2 _0 GLUCOSE 90 89 143* 100* 102*  BUN _1 CREATININE 0.89 1.09 1.26* 0.95 0.87  CALCIUM 9.0 8.8* 8.8* 8.3* 8.2*  MG  --   --  2.1  --   --    GFR Estimated Creatinine Clearance: 120.8 mL/min (by C-G formula based on SCr of 0.87 mg/dL). Liver Function Tests: Recent Labs  Lab 04/28/22 1818 05/01/22 0312  AST 20 19  ALT 13 11  ALKPHOS 76 52  BILITOT 1.0 0.3  PROT 7.8 6.6  ALBUMIN 3.3* 2.7*   No results for input(s): "LIPASE", "AMYLASE" in the last 168 hours. No results for input(s): "AMMONIA" in the last 168 hours. Coagulation profile No results for input(s): "INR", "PROTIME" in the last 168 hours.  CBC: Recent Labs  Lab 04/28/22 1155 04/28/22 1818 04/29/22 0324 04/30/22 0241 05/01/22 0312 05/02/22 0257  WBC 11.8* 10.4 9.9 16.0* 16.4* 16.3*   NEUTROABS 7.5  --   --   --   --   --   HGB 14.7 14.1 13.1 12.5* 12.0* 12.5*  HCT 44.1 43.1 40.5 38.7* 36.6* 37.9*  MCV 80.8 80.4 81.0 81.3 80.3 80.1  PLT 438* 405* 380 367 355 395   Cardiac Enzymes: No results for input(s): "CKTOTAL", "CKMB", "CKMBINDEX", "TROPONINI" in the last 168 hours. BNP: Invalid input(s): "POCBNP" CBG: No results for input(s): "GLUCAP" in the last 168 hours. D-Dimer No results for input(s): "DDIMER" in the last 72 hours. Hgb A1c No results for input(s): "HGBA1C" in the last 72 hours. Lipid Profile No results for input(s): "CHOL", "HDL", "LDLCALC", "TRIG", "CHOLHDL", "LDLDIRECT" in the last 72 hours. Thyroid function studies No results for input(s): "TSH", "T4TOTAL", "T3FREE", "THYROIDAB" in the last 72 hours.  Invalid input(s): "FREET3" Anemia work up No results for input(s): "VITAMINB12", "FOLATE", "FERRITIN", "TIBC", "IRON", "RETICCTPCT" in the last 72 hours. Microbiology Recent Results (from the past 240 hour(s))  Surgical pcr screen     Status: None   Collection Time:  04/28/22  6:48 PM   Specimen: Nasal Mucosa; Nasal Swab  Result Value Ref Range Status   MRSA, PCR NEGATIVE NEGATIVE Final   Staphylococcus aureus NEGATIVE NEGATIVE Final    Comment: (NOTE) The Xpert SA Assay (FDA approved for NASAL specimens in patients 37 years of age and older), is one component of a comprehensive surveillance program. It is not intended to diagnose infection nor to guide or monitor treatment. Performed at [facility], [place] [address], [place], [place] [address]   SARS Coronavirus 2 by RT PCR (hospital order, performed in [facility] hospital lab) *cepheid single result test* Anterior Nasal Swab     Status: None   Collection Time: 04/28/22  8:04 PM   Specimen: Anterior Nasal Swab  Result Value Ref Range Status   SARS Coronavirus 2 by RT PCR NEGATIVE NEGATIVE Final    Comment: (NOTE) SARS-CoV-2 target nucleic acids are NOT DETECTED.  The SARS-CoV-2  RNA is generally detectable in upper and lower respiratory specimens during the acute phase of infection. The lowest concentration of SARS-CoV-2 viral copies this assay can detect is 250 copies / mL. A negative result does not preclude SARS-CoV-2 infection and should not be used as the sole basis for treatment or other patient management decisions.  A negative result may occur with improper specimen collection / handling, submission of specimen other than nasopharyngeal swab, presence of viral mutation(s) within the areas targeted by this assay, and inadequate number of viral copies (<250 copies / mL). A negative result must be combined with clinical observations, patient history, and epidemiological information.  Fact Sheet for Patients:   https://www.patel.info/  Fact Sheet for Healthcare Providers: https://hall.com/  This test is not yet approved or  cleared by the [place] FDA and has been authorized for detection and/or diagnosis of SARS-CoV-2 by FDA under an Emergency Use Authorization (EUA).  This EUA will remain in effect (meaning this test can be used) for the duration of the COVID-19 declaration under Section 564(b)(1) of the Act, 21 U.S.C. section 360bbb-3(b)(1), unless the authorization is terminated or revoked sooner.  Performed at [facility], [place] [address], [place], [place] [address]      Discharge Instructions:   Discharge Instructions     Call MD for:  difficulty breathing, headache or visual disturbances   Complete by: As directed    Call MD for:  severe uncontrolled pain   Complete by: As directed    Call MD for:  temperature >100.4   Complete by: As directed    Diet general   Complete by: As directed    Discharge instructions   Complete by: As directed    Follow-up with your primary care physician in 1 week.  Follow-up with Dr. Koleen Nimrod cardiothoracic surgery on 05/18/2022 for suture removal and  follow-up.  Follow-up with pulmonary Dr Erin Fulling as outpatient in 4 to 6 weeks for COPD follow-up.  Seek medical attention for worsening symptoms   Increase activity slowly   Complete by: As directed    No wound care   Complete by: As directed       Allergies as of 05/02/2022   No Known Allergies      Medication List     STOP taking these medications    baclofen 10 MG tablet Commonly known as: LIORESAL   ezetimibe 10 MG tablet Commonly known as: Zetia   gabapentin 300 MG capsule Commonly known as: NEURONTIN   mesalamine 1.2 g EC tablet Commonly known  as: LIALDA   omeprazole 40 MG capsule Commonly known as: PRILOSEC   potassium chloride 20 MEQ/15ML (10%) Soln   predniSONE 10 MG tablet Commonly known as: DELTASONE       TAKE these medications    albuterol 108 (90 Base) MCG/ACT inhaler Commonly known as: VENTOLIN HFA Inhale 2 puffs into the lungs every 6 (six) hours as needed for wheezing or shortness of breath.   guaiFENesin-dextromethorphan 100-10 MG/5ML syrup Commonly known as: ROBITUSSIN DM Take 5 mLs by mouth every 4 (four) hours as needed for cough.   HYDROcodone-acetaminophen 5-325 MG tablet Commonly known as: NORCO/VICODIN Take 1 tablet by mouth every 6 (six) hours as needed for moderate pain.   MUCINEX ALLERGY PO Take 1 tablet by mouth every 8 (eight) hours as needed (cough).   nystatin 100000 UNIT/ML suspension Commonly known as: MYCOSTATIN Take 5 mLs (500,000 Units total) by mouth 4 (four) times daily.   pantoprazole 40 MG tablet Commonly known as: PROTONIX Take 1 tablet (40 mg total) by mouth daily. Start taking on: May 03, 2022        Follow-up Information     [name], MD. Go on 05/18/2022.   Specialty: Cardiothoracic Surgery Why: Appointment time is at 12 noon. Please obtain a chest x-ray at [facility] located at [address] 1 hour prior to the apointment. Contact information: [address] [place] [address] [phone]         [facility] Follow up.   Why: PA/LAT CXR to be taken prior to appointment with Dr. Roxan Hockey. Please arrive by 11am. Contact information: [place]        Crecencio Mc, MD Follow up in 1 week(s).   Specialty: Internal Medicine Contact information: [address] [place] [address] [phone]                  Time coordinating discharge: 39 minutes  Signed:  Muhammad Vacca  [facility] 05/02/2022, 10:10 AM

## 2022-05-02 NOTE — Progress Notes (Signed)
      [address]       [place] [address]             [phone]      3 Days Post-Op Procedure(s) (LRB): RIGHT VIDEO ASSISTED THORACOSCOPY (VATS)/ STAPELING OF BLEBS (Right) Subjective: Says he is breathing better today.  On room air all night with adequate O2 saturations.  Minimal pain since chest tube removal.  He did have some drainage from the chest tube site last evening that he said saturated in the sheets.  Chest tube site dressing was dry this morning.  Objective: Vital signs in last 24 hours: Temp:  [97.8 F (36.6 C)-98.3 F (36.8 C)] 97.9 F (36.6 C) (11/05 0847) Pulse Rate:  [72-84] 74 (11/05 0847) Cardiac Rhythm: Heart block;Bundle branch block (11/04 1023) Resp:  [16-18] 16 (11/05 0847) BP: (120-148)/(60-88) 138/88 (11/05 0847) SpO2:  [92 %-94 %] 92 % (11/05 0847)    Intake/Output from previous day: 11/04 0701 - 11/05 0700 In: 780 [P.O.:780] Out: 2650 [Urine:2650] Intake/Output this shift: No intake/output data recorded.  General appearance: alert, cooperative, and no distress Neurologic: intact Lungs: Breath sounds clear, diminished.  No dyspnea.  Reported drainage from the chest tube insertion site last night but the current dressing (placed last night) is dry.  Chest x-ray is stable with expected right apical volume loss and pleural space. Wound: The right chest minithoracotomy incision is well approximated and dry.  There is expected bruising.  Lab Results: Recent Labs    05/01/22 0312 05/02/22 0257  WBC 16.4* 16.3*  HGB 12.0* 12.5*  HCT 36.6* 37.9*  PLT 355 395    BMET:  Recent Labs    05/01/22 0312 05/02/22 0257  NA 136 134*  K 3.8 3.6  CL 103 103  CO2 24 24  GLUCOSE 100* 102*  BUN 14 8  CREATININE 0.95 0.87  CALCIUM 8.3* 8.2*     PT/INR: No results for input(s): "LABPROT", "INR" in the last 72 hours. ABG No results found for: "PHART", "HCO3", "TCO2", "ACIDBASEDEF", "O2SAT" CBG (last 3)  No results for  input(s): "GLUCAP" in the last 72 hours.  Assessment/Plan: S/P Procedure(s) (LRB): RIGHT VIDEO ASSISTED THORACOSCOPY (VATS)/ STAPELING OF BLEBS (Right) -Postop day 3 VATS with stapling of multiple blebs after presenting with spontaneous pneumothorax.   Chest x-ray stable 24 hours post chest tube removal.    We will plan for suture removal in 1 week in office follow-up with Dr. Roxan Hockey on 05/18/2022.   LOS: 3 days    Antony Odea, PA-C [phone] 05/02/2022

## 2022-05-03 ENCOUNTER — Telehealth: Payer: Self-pay

## 2022-05-03 NOTE — Telephone Encounter (Signed)
Transition Care Management Follow-up Telephone Call Date of discharge and from where:[facility] 05-02-22 Dx: pneumothorax  How have you been since you were released from the hospital? Feeling better  Any questions or concerns? No  Items Reviewed: Did the pt receive and understand the discharge instructions provided? Yes  Medications obtained and verified? Yes  Other? No  Any new allergies since your discharge? No  Dietary orders reviewed? Yes Do you have support at home? Yes   Home Care and Equipment/Supplies: Were home health services ordered? no If so, what is the name of the agency? na  Has the agency set up a time to come to the patient's home? not applicable Were any new equipment or medical supplies ordered?  No What is the name of the medical supply agency? na Were you able to get the supplies/equipment? not applicable Do you have any questions related to the use of the equipment or supplies? No  Functional Questionnaire: (I = Independent and D = Dependent) ADLs: I  Bathing/Dressing- I  Meal Prep- I  Eating- I  Maintaining continence- I  Transferring/Ambulation- I  Managing Meds- I  Follow up appointments reviewed:  PCP Hospital f/u appt confirmed? Yes  Scheduled to see Dr Derrel Nip on 05-13-22 @ [facility] f/u appt confirmed? Yes  Scheduled to see Dr Roxan Hockey on 05-18-22 @ noon and Dr Erin Fulling on 06-02-22 at 130pm. Are transportation arrangements needed? No  If their condition worsens, is the pt aware to call PCP or go to the Emergency Dept.? Yes Was the patient provided with contact information for the PCP's office or ED? Yes Was to pt encouraged to call back with questions or concerns? Yes   Juanda Crumble LPN [facility] [phone]

## 2022-05-04 LAB — ALPHA-1-ANTITRYPSIN PHENOTYP: A-1 Antitrypsin, Ser: 200 mg/dL — ABNORMAL HIGH (ref 101–187)

## 2022-05-07 ENCOUNTER — Other Ambulatory Visit: Payer: Self-pay | Admitting: Internal Medicine

## 2022-05-07 ENCOUNTER — Other Ambulatory Visit: Payer: Self-pay | Admitting: Physician Assistant

## 2022-05-07 ENCOUNTER — Telehealth: Payer: Self-pay | Admitting: *Deleted

## 2022-05-07 MED ORDER — HYDROCODONE-ACETAMINOPHEN 5-325 MG PO TABS: 1.0000 | ORAL_TABLET | Freq: Four times a day (QID) | ORAL | 0 refills | Status: DC | PRN

## 2022-05-07 NOTE — Telephone Encounter (Signed)
Mr. Werts, s/p VATs for resection of blebs 11/2 by Dr. Roxan Hockey, contacted the office requesting a refill of vicodin. Patient was discharged home 11/5. Patient states he has been taking the vicodin every 6-8 hours for his pain. Patient states he ran out of medication last night and today's pain level is a 7. Pain is located at his surgical incision. Patient denies any signs or symptoms of infection at this site. Refill sent in to patient's preferred pharmacy by E. Barrett, PA. Patient made aware.

## 2022-05-10 NOTE — Progress Notes (Signed)
GI Location of Tumor / Histology: Esophagus Cancer  Bruce Graham has a history of Barrett's Esophagus and underwent screening EGD on 12/16/2021 that showed reflux esophagitis.  CT Chest 04/28/2022: Moderate to large right pneumothorax. Scarring/atelectasis in the right upper lobe.  PET 01/28/2022: No evidence of FDG avid disease.    EUS 01/04/2022:   CT Chest 12/25/2021: Moderate wall thickening involving the distal esophagus near the GE junction but no para-esophageal or upper abdominal lymphadenopathy.  No findings for pulmonary metastatic disease.  Pathology Report:  Esophagus/Endo-mucosal resection 03/15/2022    Past/Anticipated interventions by surgeon, if any:  Dr. Elenor Quinones -Recommend Esophagectomy   Past/Anticipated interventions by medical oncology, if any:  Dr. Burr Medico 04/26/2022 -He was referred to Dr. Elenor Quinones and underwent endoscopic resection on 03/15/22.  - Esophagectomy was recommended by Dr. Elenor Quinones, however pt is very reluctant to have surgery.  -I discussed that surgical resection/esophagectomy is the standard therapy after endoscopy resection with positive margin for T1b disease and I strongly encourage him to consider it.  He is young and fit, would be a good candidate for surgery.  -I discussed the option of concurrent chemoradiation for 5.5  weeks if he still declined surgery.  I discussed the treatment course with him in detail.  He will think about it and agreed to see radiation oncologist Dr. Lisbeth Renshaw.  -Patient is most interested in immunotherapy.  I will request PD-L1 and HER2 on his tumor sample at [facility], which can predict his response to immunotherapy.  I discussed that immunotherapy is not approved for stage I esophageal cancer, but it does not have a role in adjuvant setting for early stage, and it is one of the therapy options for metastatic disease. -Due to his recently diagnosed ulcerative colitis, I am concerned potential worsening colitis when he on mono-therapy.I do  not think it's a good option for him, but will consider if he refuses all other therapy options.      Weight changes, if any: None  Bowel/Bladder complaints, if any: No  Nausea / Vomiting, if any: No  Pain issues, if any: He is recovering from VATS  Appetite: Good.  Swallowing:  Denies pain or difficulty with swallowing.  Has not made any dietary changes.     SAFETY ISSUES: Prior radiation? No Pacemaker/ICD? No Possible current pregnancy? N/a Is the patient on methotrexate? No  Current Complaints/Details:

## 2022-05-10 NOTE — Progress Notes (Signed)
Radiation Oncology         [phone] ________________________________  Name: Bruce Graham        MRN: [id number]  Date of Service: 05/11/2022 DOB: 1970-12-20  QV:[id number], Aris Everts, MD  Truitt Merle, MD     REFERRING PHYSICIAN: Truitt Merle, MD   DIAGNOSIS: The encounter diagnosis was Malignant neoplasm of lower third of esophagus ([place]).   HISTORY OF PRESENT ILLNESS: [name] is a 51 y.o. male seen at the request of Dr. Burr Medico for diagnosis of adenocarcinoma of the distal esophagus .  The patient has a history of Barrett's esophagus and routine endoscopy on 12/16/2021 showed visible esophagitis but no evidence of ulcerative findings.  Biopsies taken throughout the region of the esophagitis showed some low-grade dysplastic findings as well as adenocarcinoma at the 36 cm biopsy.  None of the other specimens were malignant.  He underwent staging CT of the the chest on 12/25/2021 which was negative without evidence of metastatic disease.  Endoscopy on 01/04/2022 was unable to measure a mass, and PET scan on 01/28/2022 did not show any evidence of PET avid disease.  He was referred to Dr. Elenor Quinones and underwent endoscopic resection on 03/15/2022 and pathology confirmed well-differentiated adenocarcinoma invading the submucosa with positive margins.  He was encouraged to proceed with esophagectomy for his stage I disease but has been unsure about proceeding surgically.  He is seen to discuss possibilities of chemoradiation but is aware that the standard of care would be to proceed with esophagectomy.    PREVIOUS RADIATION THERAPY: {EXAM; YES/NO:19492::"No"}   PAST MEDICAL HISTORY:  Past Medical History:  Diagnosis Date   Abscess of right thigh 06/16/2015   Barrett esophagus    Bronchitis    Complication of anesthesia    hard to wake up after hernia surgery   Ear infection 12/13/2017   taking amoxicillin   Family history of adverse reaction to anesthesia    mom hard to wake up    GERD  (gastroesophageal reflux disease)    HLD (hyperlipidemia)    Hydradenitis    Left leg   Hypertension    Intervertebral disc disorder    Myalgia    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/07/2015   Suppurative hidradenitis    Umbilical hernia without obstruction and without gangrene 11/22/2017       PAST SURGICAL HISTORY: Past Surgical History:  Procedure Laterality Date   COLONOSCOPY WITH PROPOFOL N/A 03/06/2018   Procedure: COLONOSCOPY WITH PROPOFOL;  Surgeon: Lin Landsman, MD;  Location: [facility] ENDOSCOPY;  Service: Gastroenterology;  Laterality: N/A;   COLONOSCOPY WITH PROPOFOL N/A 12/16/2021   Procedure: COLONOSCOPY WITH PROPOFOL;  Surgeon: Robert Bellow, MD;  Location: [facility] ENDOSCOPY;  Service: Endoscopy;  Laterality: N/A;   ESOPHAGOGASTRODUODENOSCOPY (EGD) WITH PROPOFOL N/A 03/06/2018   Procedure: ESOPHAGOGASTRODUODENOSCOPY (EGD) WITH PROPOFOL;  Surgeon: Lin Landsman, MD;  Location: [facility] ENDOSCOPY;  Service: Gastroenterology;  Laterality: N/A;   ESOPHAGOGASTRODUODENOSCOPY (EGD) WITH PROPOFOL N/A 12/16/2021   Procedure: ESOPHAGOGASTRODUODENOSCOPY (EGD) WITH PROPOFOL;  Surgeon: Robert Bellow, MD;  Location: [facility] ENDOSCOPY;  Service: Endoscopy;  Laterality: N/A;   HERNIA REPAIR     INCISION AND DRAINAGE ABSCESS N/A 05/02/2020   Procedure: INCISION AND DRAINAGE ABSCESS;  Surgeon: Robert Bellow, MD;  Location: [facility] ORS;  Service: General;  Laterality: N/A;   INCISION AND DRAINAGE PERIRECTAL ABSCESS Right 06/16/2015   Procedure: IRRIGATION AND DEBRIDEMENT right inner thigh ABSCESS;  Surgeon: Florene Glen, MD;  Location: [facility] ORS;  Service: General;  Laterality: Right;  INSERTION OF MESH N/A 12/20/2017   Procedure: INSERTION OF MESH;  Surgeon: [name], MD;  Location: [facility] ORS;  Service: General;  Laterality: N/A;   KNEE ARTHROSCOPY WITH ANTERIOR CRUCIATE LIGAMENT (ACL) REPAIR Left 2002   KNEE SURGERY Left    PILONIDAL CYST EXCISION N/A 05/02/2020    Procedure: CYST EXCISION PILONIDAL EXTENSIVE;  Surgeon: Robert Bellow, MD;  Location: [facility] ORS;  Service: General;  Laterality: N/A;   UMBILICAL HERNIA REPAIR N/A 12/20/2017   Procedure: HERNIA REPAIR UMBILICAL ADULT;  Surgeon: [name], MD;  Location: [facility] ORS;  Service: General;  Laterality: N/A;   VIDEO ASSISTED THORACOSCOPY (VATS)/WEDGE RESECTION Right 04/29/2022   Procedure: RIGHT VIDEO ASSISTED THORACOSCOPY (VATS)/ STAPELING OF BLEBS;  Surgeon: [name], MD;  [facility];  Service: Thoracic;  Laterality: Right;     FAMILY HISTORY:  Family History  Problem Relation Age of Onset   Heart disease Father 15   Cancer Father 62       Lung Cancer   Cancer Paternal Grandfather 24       in the bones     SOCIAL HISTORY:  reports that he has been smoking cigarettes. He has a 60.00 pack-year smoking history. He has never used smokeless tobacco. He reports that he does not currently use alcohol. He reports that he does not use drugs.  Patient is single and lives in [place].   ALLERGIES: Patient has no known allergies.   MEDICATIONS:  Current Outpatient Medications  Medication Sig Dispense Refill   albuterol (VENTOLIN HFA) 108 (90 Base) MCG/ACT inhaler Inhale 2 puffs into the lungs every 6 (six) hours as needed for wheezing or shortness of breath. 8 g 2   ezetimibe (ZETIA) 10 MG tablet TAKE 1 TABLET BY MOUTH ONCE DAILY 90 tablet 3   Fexofenadine HCl (MUCINEX ALLERGY PO) Take 1 tablet by mouth every 8 (eight) hours as needed (cough).     guaiFENesin-dextromethorphan (ROBITUSSIN DM) 100-10 MG/5ML syrup Take 5 mLs by mouth every 4 (four) hours as needed for cough. 118 mL 0   HYDROcodone-acetaminophen (NORCO/VICODIN) 5-325 MG tablet Take 1 tablet by mouth every 6 (six) hours as needed for moderate pain. 15 tablet 0   nystatin (MYCOSTATIN) 100000 UNIT/ML suspension Take 5 mLs (500,000 Units total) by mouth 4 (four) times daily. 60 mL 0   pantoprazole  (PROTONIX) 40 MG tablet Take 1 tablet (40 mg total) by mouth daily. 30 tablet 0   No current facility-administered medications for this visit.     REVIEW OF SYSTEMS: On review of systems, the patient reports that he is doing ***     PHYSICAL EXAM:  Wt Readings from Last 3 Encounters:  04/29/22 232 lb 12.9 oz (105.6 kg)  04/26/22 233 lb 1.6 oz (105.7 kg)  12/16/21 225 lb 7.4 oz (102.3 kg)   Temp Readings from Last 3 Encounters:  05/02/22 97.9 F (36.6 C) (Oral)  04/28/22 97.7 F (36.5 C) (Oral)  04/26/22 98.2 F (36.8 C) (Oral)   BP Readings from Last 3 Encounters:  05/02/22 138/88  04/28/22 (!) 162/105  04/26/22 (!) 150/92   Pulse Readings from Last 3 Encounters:  05/02/22 74  04/28/22 74  04/26/22 79    /10  In general this is a well appearing Caucasian male in no acute distress.  He's alert and oriented x4 and appropriate throughout the examination. Cardiopulmonary assessment is negative for acute distress and he exhibits normal effort.     ECOG = ***  0 - Asymptomatic (Fully active, able to carry on all predisease activities without restriction)  1 - Symptomatic but completely ambulatory (Restricted in physically strenuous activity but ambulatory and able to carry out work of a light or sedentary nature. For example, light housework, office work)  2 - Symptomatic, <50% in bed during the day (Ambulatory and capable of all self care but unable to carry out any work activities. Up and about more than 50% of waking hours)  3 - Symptomatic, >50% in bed, but not bedbound (Capable of only limited self-care, confined to bed or chair 50% or more of waking hours)  4 - Bedbound (Completely disabled. Cannot carry on any self-care. Totally confined to bed or chair)  5 - Death   Eustace Pen MM, Creech RH, Tormey DC, et al. [phone]). "Toxicity and response criteria of the [facility]". [place] Oncol. 5 (6): 649-55    LABORATORY DATA:  Lab Results   Component Value Date   WBC 16.3 (H) 05/02/2022   HGB 12.5 (L) 05/02/2022   HCT 37.9 (L) 05/02/2022   MCV 80.1 05/02/2022   PLT 395 05/02/2022   Lab Results  Component Value Date   NA 134 (L) 05/02/2022   K 3.6 05/02/2022   CL 103 05/02/2022   CO2 24 05/02/2022   Lab Results  Component Value Date   ALT 11 05/01/2022   AST 19 05/01/2022   ALKPHOS 52 05/01/2022   BILITOT 0.3 05/01/2022      RADIOGRAPHY: DG Chest 2 View  Result Date: 05/02/2022 CLINICAL DATA:  Right-sided pneumothorax EXAM: CHEST - 2 VIEW COMPARISON:  05/01/2022 FINDINGS: Cardiac shadow is stable. Persistent interstitial changes are noted bilaterally worse on the right than the left. Tiny right pneumothorax is again identified and stable. The overall inspiratory effort is poor. No bony abnormality is noted. IMPRESSION: Tiny right apical pneumothorax stable from the previous day. No other acute abnormality is noted. Electronically Signed   By: Inez Catalina M.D.   On: 05/02/2022 09:52   DG Chest 1 View  Result Date: 05/01/2022 CLINICAL DATA:  Follow-up pneumothorax. EXAM: CHEST  1 VIEW COMPARISON:  05/01/2022 and prior radiographs FINDINGS: RIGHT thoracostomy tube has been removed. Probable small RIGHT apical pneumothorax, less than 10%, does not appear significantly changed but more difficult to visualize on prior study. The cardiomediastinal silhouette is unchanged. LOWER RIGHT chest subcutaneous emphysema is again noted. The LEFT lung is clear. Mild LEFT basilar atelectasis again noted. IMPRESSION: RIGHT thoracostomy tube removal with unchanged probable small RIGHT apical pneumothorax, less than 10%. Electronically Signed   By: Margarette Canada M.D.   On: 05/01/2022 12:06   DG Chest Port 1 View  Result Date: 05/01/2022 CLINICAL DATA:  Follow-up right pneumothorax. EXAM: PORTABLE CHEST 1 VIEW COMPARISON:  04/30/2022 FINDINGS: Right-sided chest tube remains in place. No residual pneumothorax visualized on today's exam. Mild  atelectasis or contusion is again seen in the right midlung along the course of surgical staples. Left lung is clear. Heart size is normal. IMPRESSION: Right-sided chest tube in place. No residual pneumothorax visualized. Mild atelectasis or contusion in right midlung. Electronically Signed   By: Marlaine Hind M.D.   On: 05/01/2022 08:51   DG Chest Port 1 View  Result Date: 04/30/2022 CLINICAL DATA:  Shortness of breath, status post right lung resection. EXAM: PORTABLE CHEST 1 VIEW COMPARISON:  April 29, 2022. FINDINGS: Stable cardiomediastinal silhouette. Stable position of right-sided chest tube with probable minimal right apical pneumothorax. Small amount  subcutaneous emphysema is seen over right lateral chest wall. Bony thorax is unremarkable. IMPRESSION: Stable position of right-sided chest tube with probable minimal right apical pneumothorax. Electronically Signed   By: Marijo Conception M.D.   On: 04/30/2022 08:20   DG Chest Port 1 View  Result Date: 04/29/2022 CLINICAL DATA:  Status post thoracotomy, history of pneumothorax EXAM: PORTABLE CHEST 1 VIEW COMPARISON:  04/28/2022 FINDINGS: Single frontal view of the chest demonstrates interval postsurgical changes from partial right lung resection. Right chest tube tip at the right apex. There is a trace right apical pneumothorax, volume estimated less than 5%. Volume loss in the right hemithorax consistent with partial right pneumonectomy. No focal consolidation or effusion. IMPRESSION: 1. Postsurgical changes from partial right lung resection. 2. Trace right apical pneumothorax, with right-sided chest tube in place. Volume estimated less than 5%. These results will be called to the ordering clinician or representative by the Radiologist Assistant, and communication documented in the PACS or [employer]. Electronically Signed   By: Randa Ngo M.D.   On: 04/29/2022 12:49   DG Chest Port 1 View  Result Date: 04/28/2022 CLINICAL DATA:  Chest  tube placement, post tube placement for pneumothorax. EXAM: PORTABLE CHEST 1 VIEW COMPARISON:  Chest CT April 28, 2022. FINDINGS: Post placement of RIGHT-sided chest tube. Tube coiled in the RIGHT upper chest Re-expansion of the RIGHT lung. Perhaps very small pneumothorax at the RIGHT lung apex. Presence of blebs in this region makes assessment difficult. Lateral and basilar component has resolved. Perhaps minimal volume loss along the minor fissure in the RIGHT chest persists. Bullous disease in the LEFT chest at the LEFT lung apex as well. LEFT chest is clear otherwise. On limited assessment there is no acute skeletal process. IMPRESSION: 1. Post placement of RIGHT-sided chest tube with reexpansion of the RIGHT lung as described above. Perhaps very small pneumothorax at the RIGHT lung apex. 2. Bullous disease in the LEFT lung apex. Electronically Signed   By: Zetta Bills M.D.   On: 04/28/2022 17:41   CT CHEST WO CONTRAST  Result Date: 04/28/2022 CLINICAL DATA:  Pneumothorax EXAM: CT CHEST WITHOUT CONTRAST TECHNIQUE: Multidetector CT imaging of the chest was performed following the standard protocol without IV contrast. RADIATION DOSE REDUCTION: This exam was performed according to the departmental dose-optimization program which includes automated exposure control, adjustment of the mA and/or kV according to patient size and/or use of iterative reconstruction technique. COMPARISON:  Same day chest radiograph. FINDINGS: Cardiovascular: Coronary artery atherosclerosis. Normal heart size. No pericardial effusion. Mediastinum/Nodes: No enlarged mediastinal or axillary lymph nodes. Thyroid gland, trachea, and esophagus demonstrate no significant findings. Lungs/Pleura: Moderate emphysema with apical blebs. Moderate to large right pneumothorax. Scarring/atelectasis in the right upper lobe. No consolidation. No sizable pleural effusions. Upper Abdomen: No acute findings. Small gallstones, partially imaged.  Diverticulosis. Musculoskeletal: No fracture is seen. IMPRESSION: 1. Moderate to large right pneumothorax. 2. Scarring/atelectasis in the right upper lobe. 3. Emphysema (ICD10-J43.9) with apical blebs. Electronically Signed   By: Margaretha Sheffield M.D.   On: 04/28/2022 12:57   DG Chest 2 View  Result Date: 04/28/2022 CLINICAL DATA:  Shortness of breath EXAM: CHEST - 2 VIEW COMPARISON:  05/24/2021 FINDINGS: Moderate right pneumothorax seen laterally and apically, at least 20%. Chronic interstitial coarsening with apical emphysema and bullae. Normal heart size. Aortic tortuosity. These results were called by telephone at the time of interpretation on 04/28/2022 at 10:00 am to provider [employer] , who verbally acknowledged these results.  IMPRESSION: 1. Moderate right pneumothorax, at least 20%. 2. Emphysema with apical blebs. Electronically Signed   By: Jorje Guild M.D.   On: 04/28/2022 10:23       IMPRESSION/PLAN: 1. Stage I, cT1N0M0, adenocarcinoma of the distal esophagus. I reveiwed the pathology findings and course to date. We discussed the standard of care for curative intent would be to proceed with surgical resection. If he does not proceed in this manner, ***     **Disclaimer: This note was dictated with voice recognition software. Similar sounding words can inadvertently be transcribed and this note may contain transcription errors which may not have been corrected upon publication of note.**

## 2022-05-11 ENCOUNTER — Other Ambulatory Visit: Payer: Self-pay

## 2022-05-11 ENCOUNTER — Ambulatory Visit: Payer: Self-pay

## 2022-05-11 ENCOUNTER — Encounter: Payer: Self-pay | Admitting: Radiation Oncology

## 2022-05-11 ENCOUNTER — Ambulatory Visit
Admission: RE | Admit: 2022-05-11 | Discharge: 2022-05-11 | Disposition: A | Discharge status: Home / Self Care | Payer: BC Managed Care – PPO | Source: Ambulatory Visit | Attending: Radiation Oncology | Admitting: Radiation Oncology

## 2022-05-11 VITALS — BP 162/93 | HR 43 | Temp 97.9°F | Resp 18 | Ht 70.0 in | Wt 227.4 lb

## 2022-05-11 DIAGNOSIS — Z79899 Other long term (current) drug therapy: Secondary | ICD-10-CM | POA: Diagnosis not present

## 2022-05-11 DIAGNOSIS — C155 Malignant neoplasm of lower third of esophagus: Secondary | ICD-10-CM | POA: Insufficient documentation

## 2022-05-11 DIAGNOSIS — K2100 Gastro-esophageal reflux disease with esophagitis, without bleeding: Secondary | ICD-10-CM | POA: Diagnosis not present

## 2022-05-11 DIAGNOSIS — K8020 Calculus of gallbladder without cholecystitis without obstruction: Secondary | ICD-10-CM | POA: Diagnosis not present

## 2022-05-11 DIAGNOSIS — J439 Emphysema, unspecified: Secondary | ICD-10-CM | POA: Diagnosis not present

## 2022-05-11 DIAGNOSIS — Z87891 Personal history of nicotine dependence: Secondary | ICD-10-CM | POA: Diagnosis not present

## 2022-05-11 DIAGNOSIS — Z801 Family history of malignant neoplasm of trachea, bronchus and lung: Secondary | ICD-10-CM | POA: Insufficient documentation

## 2022-05-11 DIAGNOSIS — K2270 Barrett's esophagus without dysplasia: Secondary | ICD-10-CM | POA: Insufficient documentation

## 2022-05-11 DIAGNOSIS — E785 Hyperlipidemia, unspecified: Secondary | ICD-10-CM | POA: Insufficient documentation

## 2022-05-11 DIAGNOSIS — Z4802 Encounter for removal of sutures: Secondary | ICD-10-CM

## 2022-05-11 DIAGNOSIS — I10 Essential (primary) hypertension: Secondary | ICD-10-CM | POA: Diagnosis not present

## 2022-05-11 NOTE — Progress Notes (Signed)
Patient arrived for nurse visit to remove one suture post- procedure VATS/ bleb stapling with Dr. Roxan Hockey 04/29/22 .  Suture removed with no signs/ symptoms of infection noted.  Advised patient to keep incision open to air. Surgical incision and chest tube incision site well approximated. Patient tolerated procedure well.  Patient/ family instructed to keep the incision sites clean and dry.  Patient/ family acknowledged instructions given.

## 2022-05-12 ENCOUNTER — Other Ambulatory Visit: Payer: Self-pay

## 2022-05-12 DIAGNOSIS — C155 Malignant neoplasm of lower third of esophagus: Secondary | ICD-10-CM | POA: Diagnosis not present

## 2022-05-12 NOTE — Progress Notes (Signed)
The proposed treatment discussed in conference is for discussion purpose only and is not a binding recommendation.  The patients have not been physically examined, or presented with their treatment options.  Therefore, final treatment plans cannot be decided.  

## 2022-05-13 ENCOUNTER — Encounter: Payer: Self-pay | Admitting: Internal Medicine

## 2022-05-13 ENCOUNTER — Ambulatory Visit: Payer: BC Managed Care – PPO | Admitting: Internal Medicine

## 2022-05-13 VITALS — BP 144/104 | HR 73 | Temp 97.8°F | Ht 70.0 in | Wt 227.6 lb

## 2022-05-13 DIAGNOSIS — Z720 Tobacco use: Secondary | ICD-10-CM

## 2022-05-13 DIAGNOSIS — J9311 Primary spontaneous pneumothorax: Secondary | ICD-10-CM | POA: Diagnosis not present

## 2022-05-13 DIAGNOSIS — I10 Essential (primary) hypertension: Secondary | ICD-10-CM | POA: Diagnosis not present

## 2022-05-13 DIAGNOSIS — W57XXXA Bitten or stung by nonvenomous insect and other nonvenomous arthropods, initial encounter: Secondary | ICD-10-CM | POA: Diagnosis not present

## 2022-05-13 DIAGNOSIS — J449 Chronic obstructive pulmonary disease, unspecified: Secondary | ICD-10-CM

## 2022-05-13 DIAGNOSIS — Z09 Encounter for follow-up examination after completed treatment for conditions other than malignant neoplasm: Secondary | ICD-10-CM

## 2022-05-13 DIAGNOSIS — F17210 Nicotine dependence, cigarettes, uncomplicated: Secondary | ICD-10-CM

## 2022-05-13 DIAGNOSIS — C155 Malignant neoplasm of lower third of esophagus: Secondary | ICD-10-CM

## 2022-05-13 DIAGNOSIS — M791 Myalgia, unspecified site: Secondary | ICD-10-CM

## 2022-05-13 DIAGNOSIS — T466X5A Adverse effect of antihyperlipidemic and antiarteriosclerotic drugs, initial encounter: Secondary | ICD-10-CM

## 2022-05-13 DIAGNOSIS — S60562A Insect bite (nonvenomous) of left hand, initial encounter: Secondary | ICD-10-CM | POA: Diagnosis not present

## 2022-05-13 DIAGNOSIS — J9312 Secondary spontaneous pneumothorax: Secondary | ICD-10-CM

## 2022-05-13 DIAGNOSIS — G720 Drug-induced myopathy: Secondary | ICD-10-CM

## 2022-05-13 DIAGNOSIS — C159 Malignant neoplasm of esophagus, unspecified: Secondary | ICD-10-CM | POA: Diagnosis not present

## 2022-05-13 DIAGNOSIS — J439 Emphysema, unspecified: Secondary | ICD-10-CM

## 2022-05-13 LAB — CBC WITH DIFFERENTIAL/PLATELET
Basophils Absolute: 0 10*3/uL (ref 0.0–0.1)
Basophils Relative: 0.4 % (ref 0.0–3.0)
Eosinophils Absolute: 0.2 10*3/uL (ref 0.0–0.7)
Eosinophils Relative: 1.7 % (ref 0.0–5.0)
HCT: 38.5 % — ABNORMAL LOW (ref 39.0–52.0)
Hemoglobin: 12.8 g/dL — ABNORMAL LOW (ref 13.0–17.0)
Lymphocytes Relative: 26.7 % (ref 12.0–46.0)
Lymphs Abs: 2.8 10*3/uL (ref 0.7–4.0)
MCHC: 33.3 g/dL (ref 30.0–36.0)
MCV: 79.6 fl (ref 78.0–100.0)
Monocytes Absolute: 0.7 10*3/uL (ref 0.1–1.0)
Monocytes Relative: 7 % (ref 3.0–12.0)
Neutro Abs: 6.7 10*3/uL (ref 1.4–7.7)
Neutrophils Relative %: 64.2 % (ref 43.0–77.0)
Platelets: 720 10*3/uL — ABNORMAL HIGH (ref 150.0–400.0)
RBC: 4.84 Mil/uL (ref 4.22–5.81)
RDW: 15.9 % — ABNORMAL HIGH (ref 11.5–15.5)
WBC: 10.4 10*3/uL (ref 4.0–10.5)

## 2022-05-13 LAB — BASIC METABOLIC PANEL
BUN: 8 mg/dL (ref 6–23)
CO2: 24 mEq/L (ref 19–32)
Calcium: 8.9 mg/dL (ref 8.4–10.5)
Chloride: 103 mEq/L (ref 96–112)
Creatinine, Ser: 0.97 mg/dL (ref 0.40–1.50)
GFR: 90.03 mL/min (ref >60.00)
Glucose, Bld: 103 mg/dL — ABNORMAL HIGH (ref 70–99)
Potassium: 4.2 mEq/L (ref 3.5–5.1)
Sodium: 136 mEq/L (ref 135–145)

## 2022-05-13 MED ORDER — AMLODIPINE BESYLATE 2.5 MG PO TABS: 2.5000 mg | ORAL_TABLET | Freq: Every day | ORAL | 1 refills | Status: DC

## 2022-05-13 MED ORDER — PREDNISONE 10 MG PO TABS: ORAL_TABLET | ORAL | 0 refills | Status: DC

## 2022-05-13 MED ORDER — METHYLPREDNISOLONE ACETATE 40 MG/ML IJ SUSP
40.0000 mg | Freq: Once | INTRAMUSCULAR | Status: AC
  Administered 2022-05-13: 40 mg via INTRAMUSCULAR

## 2022-05-13 MED ORDER — DOXYCYCLINE HYCLATE 100 MG PO TABS: 100.0000 mg | ORAL_TABLET | Freq: Two times a day (BID) | ORAL | 0 refills | Status: DC

## 2022-05-13 NOTE — Progress Notes (Signed)
Subjective:  Patient ID: Bruce Graham, male    DOB: Nov 16, 1970  Age: 51 y.o. MRN: [id number]  CC: The primary encounter diagnosis was Secondary spontaneous pneumothorax. Diagnoses of Insect bite of left hand, initial encounter, Essential hypertension, Tobacco abuse, Myalgia due to statin, Pulmonary emphysema, unspecified emphysema type [facility]), Hospital discharge follow-up, and Malignant neoplasm of lower third of esophagus ([place]) were also pertinent to this visit.   HPI Bruce Graham presents for follow up on recent hospitalization  Chief Complaint  Patient presents with   Hospitalization [facility] follow up - pneumothorax   Admitted Nov 1 with spontaneous PTX secondary to emphysematous bleb.  Underwent chest tube insert and stapling of bleb . Discharged on Nov 5. Stitch removed .  Lung  Staples to be removed Nov 21. By Roxan Hockey in [place].    Started with coughing and dyspnea on halloween, .  Has not smoked since oct 31   2) Recent diagnosis of Esophageal CA : diagnosed by EGD IN August.  To start chemo and XRT at [place] [facility]ology following as well   3)  acute onset of itching and swelling of left hand which started yesterday .  States that Yesterday had contact with a  50 bag of corn that he pulled into the field;  afterward he sat in a deer stand for 2 hours.    hand had been itching prior to getting into the deer stand,   and was swollen at the end of the 2 hours in the deerstand .  Took 2 benadryl last night,  no change  very itchy  .  Denies fevers .  Tetanus vaccination is up to date   4) HLD: ot taking zetia,  didn't like it    5) Stopped amlodipine as well.  Home readings were recently elevated    .   Outpatient Medications Prior to Visit  Medication Sig Dispense Refill   albuterol (VENTOLIN HFA) 108 (90 Base) MCG/ACT inhaler Inhale 2 puffs into the lungs every 6 (six) hours as needed for wheezing or shortness of breath. 8 g 2   pantoprazole (PROTONIX) 40 MG  tablet Take 1 tablet (40 mg total) by mouth daily. 30 tablet 0   ezetimibe (ZETIA) 10 MG tablet TAKE 1 TABLET BY MOUTH ONCE DAILY (Patient not taking: Reported on 05/13/2022) 90 tablet 3   Fexofenadine HCl (MUCINEX ALLERGY PO) Take 1 tablet by mouth every 8 (eight) hours as needed (cough). (Patient not taking: Reported on 05/13/2022)     guaiFENesin-dextromethorphan (ROBITUSSIN DM) 100-10 MG/5ML syrup Take 5 mLs by mouth every 4 (four) hours as needed for cough. (Patient not taking: Reported on 05/13/2022) 118 mL 0   HYDROcodone-acetaminophen (NORCO/VICODIN) 5-325 MG tablet Take 1 tablet by mouth every 6 (six) hours as needed for moderate pain. (Patient not taking: Reported on 05/13/2022) 15 tablet 0   nystatin (MYCOSTATIN) 100000 UNIT/ML suspension Take 5 mLs (500,000 Units total) by mouth 4 (four) times daily. (Patient not taking: Reported on 05/13/2022) 60 mL 0   No facility-administered medications prior to visit.    Review of Systems;  Patient denies headache, fevers, malaise, unintentional weight loss, skin rash, eye pain, sinus congestion and sinus pain, sore throat, dysphagia,  hemoptysis , cough, dyspnea, wheezing, chest pain, palpitations, orthopnea, edema, abdominal pain, nausea, melena, diarrhea, constipation, flank pain, dysuria, hematuria, urinary  Frequency, nocturia, numbness, tingling, seizures,  Focal weakness, Loss of consciousness,  Tremor, insomnia, depression, anxiety, and suicidal ideation.  Objective:  BP (!) 144/104 (BP Location: Left Arm, Patient Position: Sitting, Cuff Size: Normal)   Pulse 73   Temp 97.8 F (36.6 C) (Oral)   Ht 5' 10"  (1.778 m)   Wt 227 lb 9.6 oz (103.2 kg)   SpO2 96%   BMI 32.66 kg/m   BP Readings from Last 3 Encounters:  05/13/22 (!) 144/104  05/11/22 (!) 162/93  05/02/22 138/88    Wt Readings from Last 3 Encounters:  05/13/22 227 lb 9.6 oz (103.2 kg)  05/11/22 227 lb 6 oz (103.1 kg)  04/29/22 232 lb 12.9 oz (105.6 kg)     General appearance: alert, cooperative and appears stated age Ears: normal TM's and external ear canals both ears Throat: lips, mucosa, and tongue normal; teeth and gums normal Neck: no adenopathy, no carotid bruit, supple, symmetrical, trachea midline and thyroid not enlarged, symmetric, no tenderness/mass/nodules Back: symmetric, no curvature. ROM normal. No CVA tenderness. Lungs: clear to auscultation bilaterally Heart: regular rate and rhythm, S1, S2 normal, no murmur, click, rub or gallop Abdomen: soft, non-tender; bowel sounds normal; no masses,  no organomegaly Pulses: 2+ and symmetric Skin: Skin color, texture, turgor normal. No rashes or lesions Lymph nodes: Cervical, supraclavicular, and axillary nodes normal. Neuro:  awake and interactive with normal mood and affect. Higher cortical functions are normal. Speech is clear without word-finding difficulty or dysarthria. Extraocular movements are intact. Visual fields of both eyes are grossly intact. Sensation to light touch is grossly intact bilaterally of upper and lower extremities. Motor examination shows 4+/5 symmetric hand grip and upper extremity and 5/5 lower extremity strength. There is no pronation or drift. Gait is non-ataxic   Lab Results  Component Value Date   HGBA1C 5.3 05/01/2020    Lab Results  Component Value Date   CREATININE 0.97 05/13/2022   CREATININE 0.87 05/02/2022   CREATININE 0.95 05/01/2022    Lab Results  Component Value Date   WBC 10.4 05/13/2022   HGB 12.8 (L) 05/13/2022   HCT 38.5 (L) 05/13/2022   PLT 720.0 (H) 05/13/2022   GLUCOSE 103 (H) 05/13/2022   CHOL 158 11/13/2021   TRIG 192.0 (H) 11/13/2021   HDL 46.20 11/13/2021   LDLCALC 73 11/13/2021   ALT 11 05/01/2022   AST 19 05/01/2022   NA 136 05/13/2022   K 4.2 05/13/2022   CL 103 05/13/2022   CREATININE 0.97 05/13/2022   BUN 8 05/13/2022   CO2 24 05/13/2022   TSH 0.67 02/12/2021   HGBA1C 5.3 05/01/2020   MICROALBUR <0.7  11/13/2021    No results found.  Assessment & Plan:   Problem List Items Addressed This Visit     COPD (chronic obstructive pulmonary disease) ([place])    Secondary to tobacco use.  Complicated by spontaneous PTX Apr 27 2022.  He has quit smoking and denies dyspnea       Relevant Medications   predniSONE (DELTASONE) 10 MG tablet   Esophageal cancer ([place])    He is preparing to start chemotherapy and XRT       Relevant Medications   predniSONE (DELTASONE) 10 MG tablet   doxycycline (VIBRA-TABS) 100 MG tablet   Essential hypertension    He stopped medication on his own several months ago and continued to monitor BP at home.  Readings were 136/82.  Ecent elevations noted ; advised to resume amlodipine 2.5 mg       Relevant Medications   amLODipine (NORVASC) 2.5 MG tablet   Other Relevant Orders  Basic metabolic panel (Completed)   Lipid Panel w/reflex Direct LDL   Hospital discharge follow-up    Patient is stable post discharge and has no new issues or questions about discharge plans at the visit today for hospital follow up.  I have reviewed the records from the hospital admission in detail with patient today., including all procedures , images,  and labs       Insect bite of left hand    Presumed,  based on acute reaction including swelling and itching of hand after having contact with 50 lb bag of corn and time in deerstand.   Forearm is now also involved.  Steroids and doxycycline ordered . Antihistamines for itching.  Cbc ordered.  If redness worsens, advised to go to ER       Relevant Orders   CBC with Differential/Platelet (Completed)   Myalgia due to statin    He has also declined zetia continuation due to intolerance.        Pneumothorax - Primary    Spontaneous secondary to bleb.  S/p chest tube and stapling via right thoracotomy.  Sutures were removed yesterday.  Lung sounds are normal in all fields.  For staple removal by CVTS on Nov 21       Tobacco abuse     His last cigarette was Apr 27 2022       I spent a total of   minutes with this patient in a face to face visit on the date of this encounter reviewing the last office visit with me in       ,  most recent visit with cardiology ,    ,  patient's diet and exercise habits, home blood pressure /blod sugar readings, recent ER visit including labs and imaging studies ,   and post visit ordering of testing and therapeutics.    Follow-up: No follow-ups on file.   Crecencio Mc, MD

## 2022-05-13 NOTE — Assessment & Plan Note (Addendum)
He stopped medication on his own several months ago and continued to monitor BP at home.  Readings were 136/82.  Ecent elevations noted ; advised to resume amlodipine 2.5 mg

## 2022-05-13 NOTE — Progress Notes (Signed)
[facility] OFFICE PROGRESS NOTE  Bruce Mc, MD [address] [place] [address]  DIAGNOSIS: Esophageal Cancer   Oncology History  Esophageal cancer [facility])  12/16/2021 Procedure   Upper GI endoscopy, Dr. Bary Castilla  Impression: - LA Grade C reflux esophagitis with no bleeding. Rule out Barrett's esophagus. Biopsied. - Small hiatal hernia. - Normal examined duodenum.   12/16/2021 Initial Biopsy   DIAGNOSIS:  A. ESOPHAGUS, 40 CM; COLD BIOPSY:  - SQUAMOCOLUMNAR MUCOSA WITH INTESTINAL METAPLASIA, COMPATIBLE WITH HISTORY OF BARRETT'S ESOPHAGUS, WITH FOCAL LOW GRADE DYSPLASIA.  - NEGATIVE FOR HIGH-GRADE DYSPLASIA AND MALIGNANCY.   B. ESOPHAGUS, 38 CM; BIOPSY:  - SQUAMOCOLUMNAR MUCOSA WITH INTESTINAL METAPLASIA, COMPATIBLE WITH HISTORY OF BARRETT'S ESOPHAGUS, WITH FOCAL LOW GRADE DYSPLASIA.  - NEGATIVE FOR HIGH-GRADE DYSPLASIA AND MALIGNANCY.   C. ESOPHAGUS, 36 CM; COLD BIOPSY:  - INVASIVE MODERATELY DIFFERENTIATED ADENOCARCINOMA, ARISING IN A BACKGROUND OF BARRETT'S ESOPHAGUS WITH HIGH-GRADE DYSPLASIA.   D. COLON, CECUM; COLD BIOPSY:  - CHRONIC COLITIS WITH MODERATE ACTIVITY (CRYPTITIS AND CRYPT ABSCESSES).  - NEGATIVE FOR GRANULOMA, DYSPLASIA, AND MALIGNANCY.   E. COLON, DESCENDING; COLD BIOPSY:  - CHRONIC COLITIS WITH MODERATE ACTIVITY (CRYPTITIS AND CRYPT ABSCESSES). - NEGATIVE FOR GRANULOMA, DYSPLASIA, AND MALIGNANCY.   F. RECTUM; COLD BIOPSY:  - CHRONIC PROCTITIS WITH MODERATE ACTIVITY (CRYPTITIS AND CRYPT ABSCESSES).  - NEGATIVE FOR GRANULOMA, DYSPLASIA, AND MALIGNANCY.   Comment:  Biopsy sections taken from the cecum, descending colon, and rectum display crypt architectural abnormalities (crypt branching and bifid crypts), basal lymphoplasmacytosis, and active intramucosal inflammation ranging from cryptitis to crypt abscesses.  The features are highly suggestive of an evolving inflammatory bowel disease.  Clinical and endoscopic correlation  is recommended.    12/16/2021 Cancer Staging   Staging form: Esophagus - Adenocarcinoma, AJCC 8th Edition - Clinical stage from 12/16/2021: Stage I (cT1b, cN0, cM0, G2) - Signed by Truitt Merle, MD on 04/27/2022 Stage prefix: Initial diagnosis Total positive nodes: 0 Histologic grading system: 3 grade system Type of surgical resection with curative intent: Endoscopic mucosal resection   12/17/2021 Initial Diagnosis   Esophageal cancer ([place])   12/25/2021 Imaging   EXAM: CT CHEST WITH CONTRAST  IMPRESSION: 1. Moderate wall thickening involving the distal esophagus near the GE junction but no paraesophageal or upper abdominal lymphadenopathy. 2. No findings for pulmonary metastatic disease. 3. Age advanced atherosclerotic calcifications involving the coronary arteries. 4. Advanced emphysematous changes with paraseptal bulla.   Aortic Atherosclerosis (ICD10-I70.0) and Emphysema (ICD10-J43.9).   01/04/2022 Procedure   Upper EUS, Dr. Mont Dutton  EGD Impressions: - Esophageal mucosal changes consistent with long-segment Barrett's esophagus. - Cratered esophageal ulcer at 40 cm (up to 8 mm in largest dimension). Biopsied. Suspect this is the site of previous biopsy proven adenocarcinoma. - Normal stomach. - Normal examined duodenum.   EUS Impressions: - There was no sign of significant pathology in the esophagus. No obvious esophageal mass was seen. Unable to T stage due to no mass lesion seen. - No periesophageal or perigastric lymph nodes visualized. - Normal visualized portions of the liver. - Normal celiac region.   01/04/2022 Pathology Results   DIAGNOSIS    A. Esophagus, 40 cm, esophageal ulcer, endoscopic biopsy:   Barrett esophagus with high-grade dysplasia. (See comment)   Cytomegalovirus esophagitis.    Comment: A p53 immunostain demonstrates loss of p53 expression within the atypical gastric mucosa, consistent with TP53 null mutant, supporting a diagnosis of high-grade  dysplasia. CMV immunohistochemistry highlights rare positive cells, corroborated by  viral cytopathic effect on H&E. PASD stain is negative for fungal organisms. Multiple step sections are examined.        01/28/2022 PET scan   FDG PET SBMT W NONDIAGNOSTIC CONCURRENT CT INITIAL   IMPRESSION:  1.  No evidence of FDG avid disease.   2.  Gluteal cleft findings favored to infectious/inflammatory, correlate with physical exam.    03/15/2022 Pathology Results   DIAGNOSIS    A. Esophagus, 36 cm, endomucosal resection:   Invasive, moderately differentiated adenocarcinoma. Tumor is seen throughout a thickened muscularis mucosal layer. Only a small amount of submucosa is present, best seen in block A2, and here tumor is present within the submucosa. Cauterized tumor is present at the deep margin (orange ink) and a peripheral margin (blue ink). Negative for lymphovascular and perineural invasion. See summary in synoptic report.   B. Esophagus, 36 cm #2, endomucosal resection:   Invasive, moderately-differentiated adenocarcinoma. Tumor is seen throughout a thickened muscularis mucosal layer. A good representation of submucosal tissue is also present, with small areas of tumor seen in submucosal tissue and focally at the deep (orange ink) margin. Tumor is also present at a peripheral margin (blue ink). Negative for lymphovascular and perineural invasion. See summary in synoptic report.   C. Esophagus, 36 cm #3, endomucosal resection:   Invasive moderately differentiated adenocarcinoma. Tumor is focally seen in a thickened muscularis mucosal layer. A mucous gland is present but no submucosal tissue is present in this small specimen. Cauterized tumor is present at orange ink (the margin of this small specimen is entirely inked orange). Negative for lymphovascular and perineural invasion. See summary in synoptic report.   D. Esophagus, 39 cm, biopsy:   Gastric type mucosa. No squamous mucosa  is seen. Negative for intestinal metaplasia.  Negative for dysplasia.   E. Esophagus, 37 cm, biopsy:    Focal area concerning for high grade dysplasia is seen in a background of squamocolumnar junctional mucosa with goblet cell intestinal metaplasia (Barrett's esophagus).  The focal area is best seen on level 1, deeper levels 3-5 do not demonstrate the concerning area. Negative for malignancy.   Comment:  See the synoptic report for summary.  While I very much suspect the T-stage is pT1b (submucosa), this is technically considered indefinite in the presence of positive deep margins. MSI testing has been requested on block B2 of this case; results will be issued separately.       CURRENT THERAPY: Concurrent chemoradiation with carboplatin and paclitaxel weekly.  First dose expected on 05/24/2022  INTERVAL HISTORY: Bruce Graham 51 y.o. male returns to the clinic today for a follow-up visit.  The patient was last seen by Dr. Burr Medico on 04/26/2022 for consultation.  The patient was previously being seen by [facility]. The patient had endoscopic resection on 03/15/2022. This showed moderately differentiated adenocarcinoma with mucosal invasion and positive margins.  Dr. Keturah Barre' Amico recommended esophagectomy but the patient was reluctant to consider surgery and came to the [facility] for a second opinion.  At his last appointment, Dr. Burr Medico discussed that esophagectomy standard therapy after endoscopic evaluations with positive margin for T1b disease.  Dr. Burr Medico strongly encouraged him to consider it.  Ultimately, the patient declined surgery and opted to proceed with concurrent chemoradiation for 5.5 weeks.  He met with Dr. Lisbeth Renshaw and is expected to start his CT simulation earlier this morning.  Since last being seen, the patient had PD-L1 and HER2 testing performed which showed PDL1 expression of 2%. Her-2 amplified.  On Halloween, patient had a coughing spell which caused a apical bleb to was a  right pneumothorax.  He is being followed outpatient by cardiothoracic surgery.  Since being discharged from the hospital on 11/5, the patient is feeling a lot better at this time.  Patient quit smoking in Halloween.  Overall, the patient is feeling fine today.  Denies any fever, chills, night sweats, or unexplained weight loss.  He denies any appetite change, odynophagia, or dysphagia.  Denies any abdominal pain.  Denies any shortness of breath. Denies any chest pain, dyspnea, wheezing, or cough  Denies any heartburn.  Denies any nausea, vomiting, diarrhea, or constipation.  Denies any back pain.  He is here today for evaluation and to discuss his treatment in more detail.    Past Medical History:  Diagnosis Date   Abscess of right thigh 06/16/2015   Barrett esophagus    Bronchitis    Complication of anesthesia    hard to wake up after hernia surgery   Ear infection 12/13/2017   taking amoxicillin   Family history of adverse reaction to anesthesia    mom hard to wake up    GERD (gastroesophageal reflux disease)    HLD (hyperlipidemia)    Hydradenitis    Left leg   Hypertension    Intervertebral disc disorder    Myalgia    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/07/2015   Suppurative hidradenitis    Umbilical hernia without obstruction and without gangrene 11/22/2017    ALLERGIES:  has No Known Allergies.  MEDICATIONS:  Current Outpatient Medications  Medication Sig Dispense Refill   ondansetron (ZOFRAN) 8 MG tablet Take 1 tablet (8 mg total) by mouth every 8 (eight) hours as needed for nausea or vomiting. Starting from day 3 30 tablet 2   prochlorperazine (COMPAZINE) 10 MG tablet Take 1 tablet (10 mg total) by mouth every 6 (six) hours as needed. 30 tablet 2   albuterol (VENTOLIN HFA) 108 (90 Base) MCG/ACT inhaler Inhale 2 puffs into the lungs every 6 (six) hours as needed for wheezing or shortness of breath. 8 g 2   amLODipine (NORVASC) 2.5 MG tablet Take 1 tablet (2.5 mg total) by mouth  daily. 90 tablet 1   doxycycline (VIBRA-TABS) 100 MG tablet Take 1 tablet (100 mg total) by mouth 2 (two) times daily. 14 tablet 0   ezetimibe (ZETIA) 10 MG tablet TAKE 1 TABLET BY MOUTH ONCE DAILY (Patient not taking: Reported on 05/13/2022) 90 tablet 3   pantoprazole (PROTONIX) 40 MG tablet Take 1 tablet (40 mg total) by mouth daily. 30 tablet 0   predniSONE (DELTASONE) 10 MG tablet 6 tablets on Day 1 , then reduce by 1 tablet daily until gone 21 tablet 0   No current facility-administered medications for this visit.    SURGICAL HISTORY:  Past Surgical History:  Procedure Laterality Date   COLONOSCOPY WITH PROPOFOL N/A 03/06/2018   Procedure: COLONOSCOPY WITH PROPOFOL;  Surgeon: Lin Landsman, MD;  Location: [facility] ENDOSCOPY;  Service: Gastroenterology;  Laterality: N/A;   COLONOSCOPY WITH PROPOFOL N/A 12/16/2021   Procedure: COLONOSCOPY WITH PROPOFOL;  Surgeon: Robert Bellow, MD;  Location: [facility] ENDOSCOPY;  Service: Endoscopy;  Laterality: N/A;   ESOPHAGOGASTRODUODENOSCOPY (EGD) WITH PROPOFOL N/A 03/06/2018   Procedure: ESOPHAGOGASTRODUODENOSCOPY (EGD) WITH PROPOFOL;  Surgeon: Lin Landsman, MD;  Location: [facility] ENDOSCOPY;  Service: Gastroenterology;  Laterality: N/A;   ESOPHAGOGASTRODUODENOSCOPY (EGD) WITH PROPOFOL N/A 12/16/2021   Procedure: ESOPHAGOGASTRODUODENOSCOPY (EGD) WITH PROPOFOL;  Surgeon: Robert Bellow, MD;  Location: [facility] ENDOSCOPY;  Service: Endoscopy;  Laterality: N/A;   HERNIA REPAIR     INCISION AND DRAINAGE ABSCESS N/A 05/02/2020   Procedure: INCISION AND DRAINAGE ABSCESS;  Surgeon: Robert Bellow, MD;  Location: [facility] ORS;  Service: General;  Laterality: N/A;   INCISION AND DRAINAGE PERIRECTAL ABSCESS Right 06/16/2015   Procedure: IRRIGATION AND DEBRIDEMENT right inner thigh ABSCESS;  Surgeon: Florene Glen, MD;  Location: [facility] ORS;  Service: General;  Laterality: Right;   INSERTION OF MESH N/A 12/20/2017   Procedure: INSERTION OF MESH;  Surgeon:  [name], MD;  Location: [facility] ORS;  Service: General;  Laterality: N/A;   KNEE ARTHROSCOPY WITH ANTERIOR CRUCIATE LIGAMENT (ACL) REPAIR Left 2002   KNEE SURGERY Left    PILONIDAL CYST EXCISION N/A 05/02/2020   Procedure: CYST EXCISION PILONIDAL EXTENSIVE;  Surgeon: Robert Bellow, MD;  Location: [facility] ORS;  Service: General;  Laterality: N/A;   UMBILICAL HERNIA REPAIR N/A 12/20/2017   Procedure: HERNIA REPAIR UMBILICAL ADULT;  Surgeon: [name], MD;  Location: [facility] ORS;  Service: General;  Laterality: N/A;   VIDEO ASSISTED THORACOSCOPY (VATS)/WEDGE RESECTION Right 04/29/2022   Procedure: RIGHT VIDEO ASSISTED THORACOSCOPY (VATS)/ STAPELING OF BLEBS;  Surgeon: [name], MD;  Location: [place];  Service: Thoracic;  Laterality: Right;    REVIEW OF SYSTEMS:   Review of Systems  Constitutional: Negative for appetite change, chills, fatigue, fever and unexpected weight change.  HENT: Negative for mouth sores, nosebleeds, sore throat and trouble swallowing.   Eyes: Negative for eye problems and icterus.  Respiratory: Negative for cough, hemoptysis, shortness of breath and wheezing.   Cardiovascular: Negative for chest pain and leg swelling.  Gastrointestinal: Negative for abdominal pain, constipation, diarrhea, nausea and vomiting.  Genitourinary: Negative for bladder incontinence, difficulty urinating, dysuria, frequency and hematuria.   Musculoskeletal: Negative for back pain, gait problem, neck pain and neck stiffness.  Skin: Negative for itching and rash.  Neurological: Negative for dizziness, extremity weakness, gait problem, headaches, light-headedness and seizures.  Hematological: Negative for adenopathy. Does not bruise/bleed easily.  Psychiatric/Behavioral: Negative for confusion, depression and sleep disturbance. The patient is not nervous/anxious.     PHYSICAL EXAMINATION:  Blood pressure 128/80, pulse 86, temperature (!) 97.5 F (36.4 C), temperature  source Temporal, resp. rate 14, weight 226 lb 4.8 oz (102.6 kg), SpO2 98 %.  ECOG PERFORMANCE STATUS: 1  Physical Exam  Constitutional: Oriented to person, place, and time and well-developed, well-nourished, and in no distress.  HENT:  Head: Normocephalic and atraumatic.  Mouth/Throat: Oropharynx is clear and moist. No oropharyngeal exudate.  Eyes: Conjunctivae are normal. Right eye exhibits no discharge. Left eye exhibits no discharge. No scleral icterus.  Neck: Normal range of motion. Neck supple.  Cardiovascular: Normal rate, regular rhythm, normal heart sounds and intact distal pulses.   Pulmonary/Chest: Effort normal and breath sounds normal. No respiratory distress. No wheezes. No rales.  Abdominal: Soft. Bowel sounds are normal. Exhibits no distension and no mass. There is no tenderness.  Musculoskeletal: Normal range of motion. Exhibits no edema.  Lymphadenopathy:    No cervical adenopathy.  Neurological: Alert and oriented to person, place, and time. Exhibits normal muscle tone. Gait normal. Coordination normal.  Skin: Skin is warm and dry. No rash noted. Not diaphoretic. No erythema. No pallor.  Psychiatric: Mood, memory and judgment normal.  Vitals reviewed.  LABORATORY DATA: Lab Results  Component Value Date   WBC 10.4 05/13/2022   HGB 12.8 (L) 05/13/2022  HCT 38.5 (L) 05/13/2022   MCV 79.6 05/13/2022   PLT 720.0 (H) 05/13/2022      Chemistry      Component Value Date/Time   NA 136 05/13/2022 1148   NA CANCELED 12/04/2021 1545   K 4.2 05/13/2022 1148   CL 103 05/13/2022 1148   CO2 24 05/13/2022 1148   BUN 8 05/13/2022 1148   BUN CANCELED 12/04/2021 1545   CREATININE 0.97 05/13/2022 1148      Component Value Date/Time   CALCIUM 8.9 05/13/2022 1148   ALKPHOS 52 05/01/2022 0312   AST 19 05/01/2022 0312   ALT 11 05/01/2022 0312   BILITOT 0.3 05/01/2022 0312       RADIOGRAPHIC STUDIES:  DG Chest 2 View  Result Date: 05/02/2022 CLINICAL DATA:   Right-sided pneumothorax EXAM: CHEST - 2 VIEW COMPARISON:  05/01/2022 FINDINGS: Cardiac shadow is stable. Persistent interstitial changes are noted bilaterally worse on the right than the left. Tiny right pneumothorax is again identified and stable. The overall inspiratory effort is poor. No bony abnormality is noted. IMPRESSION: Tiny right apical pneumothorax stable from the previous day. No other acute abnormality is noted. Electronically Signed   By: Inez Catalina M.D.   On: 05/02/2022 09:52   DG Chest 1 View  Result Date: 05/01/2022 CLINICAL DATA:  Follow-up pneumothorax. EXAM: CHEST  1 VIEW COMPARISON:  05/01/2022 and prior radiographs FINDINGS: RIGHT thoracostomy tube has been removed. Probable small RIGHT apical pneumothorax, less than 10%, does not appear significantly changed but more difficult to visualize on prior study. The cardiomediastinal silhouette is unchanged. LOWER RIGHT chest subcutaneous emphysema is again noted. The LEFT lung is clear. Mild LEFT basilar atelectasis again noted. IMPRESSION: RIGHT thoracostomy tube removal with unchanged probable small RIGHT apical pneumothorax, less than 10%. Electronically Signed   By: Margarette Canada M.D.   On: 05/01/2022 12:06   DG Chest Port 1 View  Result Date: 05/01/2022 CLINICAL DATA:  Follow-up right pneumothorax. EXAM: PORTABLE CHEST 1 VIEW COMPARISON:  04/30/2022 FINDINGS: Right-sided chest tube remains in place. No residual pneumothorax visualized on today's exam. Mild atelectasis or contusion is again seen in the right midlung along the course of surgical staples. Left lung is clear. Heart size is normal. IMPRESSION: Right-sided chest tube in place. No residual pneumothorax visualized. Mild atelectasis or contusion in right midlung. Electronically Signed   By: Marlaine Hind M.D.   On: 05/01/2022 08:51   DG Chest Port 1 View  Result Date: 04/30/2022 CLINICAL DATA:  Shortness of breath, status post right lung resection. EXAM: PORTABLE CHEST 1  VIEW COMPARISON:  April 29, 2022. FINDINGS: Stable cardiomediastinal silhouette. Stable position of right-sided chest tube with probable minimal right apical pneumothorax. Small amount subcutaneous emphysema is seen over right lateral chest wall. Bony thorax is unremarkable. IMPRESSION: Stable position of right-sided chest tube with probable minimal right apical pneumothorax. Electronically Signed   By: Marijo Conception M.D.   On: 04/30/2022 08:20   DG Chest Port 1 View  Result Date: 04/29/2022 CLINICAL DATA:  Status post thoracotomy, history of pneumothorax EXAM: PORTABLE CHEST 1 VIEW COMPARISON:  04/28/2022 FINDINGS: Single frontal view of the chest demonstrates interval postsurgical changes from partial right lung resection. Right chest tube tip at the right apex. There is a trace right apical pneumothorax, volume estimated less than 5%. Volume loss in the right hemithorax consistent with partial right pneumonectomy. No focal consolidation or effusion. IMPRESSION: 1. Postsurgical changes from partial right lung resection. 2. Trace right apical  pneumothorax, with right-sided chest tube in place. Volume estimated less than 5%. These results will be called to the ordering clinician or representative by the Radiologist Assistant, and communication documented in the PACS or [employer]. Electronically Signed   By: Randa Ngo M.D.   On: 04/29/2022 12:49   DG Chest Port 1 View  Result Date: 04/28/2022 CLINICAL DATA:  Chest tube placement, post tube placement for pneumothorax. EXAM: PORTABLE CHEST 1 VIEW COMPARISON:  Chest CT April 28, 2022. FINDINGS: Post placement of RIGHT-sided chest tube. Tube coiled in the RIGHT upper chest Re-expansion of the RIGHT lung. Perhaps very small pneumothorax at the RIGHT lung apex. Presence of blebs in this region makes assessment difficult. Lateral and basilar component has resolved. Perhaps minimal volume loss along the minor fissure in the RIGHT chest persists.  Bullous disease in the LEFT chest at the LEFT lung apex as well. LEFT chest is clear otherwise. On limited assessment there is no acute skeletal process. IMPRESSION: 1. Post placement of RIGHT-sided chest tube with reexpansion of the RIGHT lung as described above. Perhaps very small pneumothorax at the RIGHT lung apex. 2. Bullous disease in the LEFT lung apex. Electronically Signed   By: Zetta Bills M.D.   On: 04/28/2022 17:41   CT CHEST WO CONTRAST  Result Date: 04/28/2022 CLINICAL DATA:  Pneumothorax EXAM: CT CHEST WITHOUT CONTRAST TECHNIQUE: Multidetector CT imaging of the chest was performed following the standard protocol without IV contrast. RADIATION DOSE REDUCTION: This exam was performed according to the departmental dose-optimization program which includes automated exposure control, adjustment of the mA and/or kV according to patient size and/or use of iterative reconstruction technique. COMPARISON:  Same day chest radiograph. FINDINGS: Cardiovascular: Coronary artery atherosclerosis. Normal heart size. No pericardial effusion. Mediastinum/Nodes: No enlarged mediastinal or axillary lymph nodes. Thyroid gland, trachea, and esophagus demonstrate no significant findings. Lungs/Pleura: Moderate emphysema with apical blebs. Moderate to large right pneumothorax. Scarring/atelectasis in the right upper lobe. No consolidation. No sizable pleural effusions. Upper Abdomen: No acute findings. Small gallstones, partially imaged. Diverticulosis. Musculoskeletal: No fracture is seen. IMPRESSION: 1. Moderate to large right pneumothorax. 2. Scarring/atelectasis in the right upper lobe. 3. Emphysema (ICD10-J43.9) with apical blebs. Electronically Signed   By: Margaretha Sheffield M.D.   On: 04/28/2022 12:57   DG Chest 2 View  Result Date: 04/28/2022 CLINICAL DATA:  Shortness of breath EXAM: CHEST - 2 VIEW COMPARISON:  05/24/2021 FINDINGS: Moderate right pneumothorax seen laterally and apically, at least 20%.  Chronic interstitial coarsening with apical emphysema and bullae. Normal heart size. Aortic tortuosity. These results were called by telephone at the time of interpretation on 04/28/2022 at 10:00 am to provider [facility] , who verbally acknowledged these results. IMPRESSION: 1. Moderate right pneumothorax, at least 20%. 2. Emphysema with apical blebs. Electronically Signed   By: Jorje Guild M.D.   On: 04/28/2022 10:23     ASSESSMENT/PLAN:  Olander Friedl is a 51 y.o. male with    1. Esophageal cancer, cT1N0M0, stage MMR normal  -h/o Barrett's esophagus. Screening EGD on 12/16/21 showed only reflux esophagitis, no bleeding. Pathology revealed invasive moderately differentiated adenocarcinoma at 36 cm. No other sites of malignancy. -staging chest CT 12/25/21 was negative outside of distal esophageal wall thickening. -EUS 01/04/22, unable to stage due to no obvious mass. -staging PET scan 01/28/22 showed no FDG-avid disease. -He was referred to Dr. Elenor Quinones and underwent endoscopic resection on 03/15/22.  Pathology confirmed invasive moderately differentiated adenocarcinoma to esophagus at 36 cm, invading  submucosal, with positive margins.  Esophagectomy was recommended by Dr. Elenor Quinones, however pt is very reluctant to have surgery.  Dr. Burr Medico also strongly encouraged the patient to consider esophagectomy as it is standard therapy after endoscopy resection with positive margin for T1b disease. He is young and fit, would be a good candidate for surgery. -He opted not to proceed with surgery if there are non-surgical options available.  Therefore, he is here today to discuss the option of concurrent chemoradiation for 5.5  weeks. He met with Dr. Lisbeth Renshaw this morning for CT simulation. The plan is to start treatment on 05/24/22 -Dr. Burr Medico has requested PD-L1 and HER2 testing.  PD-L1 expression 2%.  HER2 was found to be amplified. Regarding immunotherapy, of note the patient has a diagnosis of ulcerative  colitis.  Dr. Burr Medico discussed that immunotherapy is not an option for him in the nonmetastatic setting. -Dr. Burr Medico discussed the chemotherapy in great detail.  She recommended carboplatin for AUC of 2 and paclitaxel 45 mg per metered square weekly starting on 05/24/2022.  She discussed adverse side effects of treatment including to fatigue, risk of infection, myelosuppression, nausea, vomiting, peripheral neuropathy, kidney, liver dysfunction.  Dr. Burr Medico mention the adverse side effects of radiation including esophagitis and strictures. -Burr Medico encouraged the patient to get up-to-date on his vaccines prior to starting chemo education.  The patient will get his flu shot.  He is not interested in getting a COVID 19 vaccine or HSV vaccine at this time. -After Burr Medico discussed that the patient's chemotherapy is once a week on Mondays for approximately 6 weeks while he is undergoing radiation. -We will arrange for chemo education class prior to his first appointment. -I sent antiemetics to the patient's pharmacy.  -Dr. Burr Medico discussed surveillance after chemo RT which includes endoscopy 2 to 3 months after treatment to ensure no residual disease.  She discussed that he will often need frequent endoscopies to assess for disease recurrence. -We will see him back on 11/27 before starting cycle #1.  Social -The patient has a 59 year old daughter that lives in [place]. He has an 76 year old son.  -Patient is a Freight forwarder.  He is planning on taking 1 days off of work. -RN navigator discussed the services provided here at the cancer center.  The patient is aware of the services but declined referral to nutritionist or social work at this time.     PLAN:  -Start ChemoCT on 11/27 -Arrange chemo education class prior to first appointment -Antiemetics sent to pharmacy -See the patient back for follow-up visit before cycle #1 -The patient was seen with Dr. Burr Medico today   No orders of the defined types were placed in this  encounter.     Bruce Graham L Daylene Vandenbosch, PA-C 05/14/22  Addendum I have seen the patient, examined him. I agree with the assessment and and plan and have edited the notes.   Pt has decided to pursue radiation to avoid surgery. I recommend concurrent chemo with weekly carbo and taxol as a radiation sensitizer, which is an option for nonsurgical candidate based on NCCN guideline. I reviewed benefit and AEs of chemo and consent was obtained today. 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 MMR normal and low PD-L1 expression, and his history of colitis, so I would not recommend immunotherapy alone. We discussed cancer surveillance after chemoRT and role of surgery if he  has residual or recurrent disease.  All questions were answered, and he agrees with the plan. Plan to start chemoRT on 05/24/22.   Truitt Merle  05/14/2022

## 2022-05-13 NOTE — Assessment & Plan Note (Addendum)
Secondary to tobacco use.  Complicated by spontaneous PTX Apr 27 2022.  He has quit smoking and denies dyspnea

## 2022-05-13 NOTE — Assessment & Plan Note (Signed)
Presumed,  based on acute reaction including swelling and itching of hand after having contact with 50 lb bag of corn and time in [place].   Forearm is now also involved.  Steroids and doxycycline ordered . Antihistamines for itching.  Cbc ordered.  If redness worsens, advised to go to ER

## 2022-05-13 NOTE — Assessment & Plan Note (Signed)
He is preparing to start chemotherapy and XRT

## 2022-05-13 NOTE — Assessment & Plan Note (Signed)
Spontaneous secondary to bleb.  S/p chest tube and stapling via right thoracotomy.  Sutures were removed yesterday.  Lung sounds are normal in all fields.  For staple removal by CVTS on Nov 21

## 2022-05-13 NOTE — Assessment & Plan Note (Signed)
His last cigarette was Apr 27 2022

## 2022-05-13 NOTE — Assessment & Plan Note (Signed)
He has also declined zetia continuation due to intolerance.

## 2022-05-13 NOTE — Patient Instructions (Signed)
Start the doxycycline TODAY.  tAKE WITH FOOD OR YOU WILL GET SICK ON STOMACH.  THIS IS YOUR ANTIBIOTIC.  COVERS INSECT BITES    Start the prednisone tomorrow    For the itching , You can use Benadryl but you should also consider adding one of these newer second generation antihistamines that are longer acting, non sedating and  available OTC:  Generic  Zyrtec, which is cetirizine.    generic Allegra , available generically as fexofenadine ; comes in 60 mg and 180 mg once daily strengths.    Generic Claritin :  also available as loratidine .

## 2022-05-13 NOTE — Assessment & Plan Note (Signed)
Patient is stable post discharge and has no new issues or questions about discharge plans at the visit today for hospital follow up.  I have reviewed the records from the hospital admission in detail with patient today., including all procedures , images,  and labs

## 2022-05-14 ENCOUNTER — Ambulatory Visit
Admission: RE | Admit: 2022-05-14 | Discharge: 2022-05-14 | Disposition: A | Discharge status: Home / Self Care | Payer: BC Managed Care – PPO | Source: Ambulatory Visit | Attending: Radiation Oncology | Admitting: Radiation Oncology

## 2022-05-14 ENCOUNTER — Inpatient Hospital Stay: Payer: BC Managed Care – PPO | Attending: Physician Assistant | Admitting: Physician Assistant

## 2022-05-14 VITALS — BP 128/80 | HR 86 | Temp 97.5°F | Resp 14 | Wt 226.3 lb

## 2022-05-14 DIAGNOSIS — Z5111 Encounter for antineoplastic chemotherapy: Secondary | ICD-10-CM | POA: Insufficient documentation

## 2022-05-14 DIAGNOSIS — Z510 Encounter for antineoplastic radiation therapy: Secondary | ICD-10-CM | POA: Insufficient documentation

## 2022-05-14 DIAGNOSIS — Z87891 Personal history of nicotine dependence: Secondary | ICD-10-CM | POA: Insufficient documentation

## 2022-05-14 DIAGNOSIS — C155 Malignant neoplasm of lower third of esophagus: Secondary | ICD-10-CM | POA: Insufficient documentation

## 2022-05-14 DIAGNOSIS — I10 Essential (primary) hypertension: Secondary | ICD-10-CM | POA: Diagnosis not present

## 2022-05-14 DIAGNOSIS — J439 Emphysema, unspecified: Secondary | ICD-10-CM | POA: Insufficient documentation

## 2022-05-14 DIAGNOSIS — C159 Malignant neoplasm of esophagus, unspecified: Secondary | ICD-10-CM | POA: Insufficient documentation

## 2022-05-14 LAB — LIPID PANEL W/REFLEX DIRECT LDL
Cholesterol: 144 mg/dL (ref <200)
HDL: 35 mg/dL — ABNORMAL LOW (ref >=40)
LDL Cholesterol (Calc): 80 mg/dL (calc)
Non-HDL Cholesterol (Calc): 109 mg/dL (calc) (ref <130)
Total CHOL/HDL Ratio: 4.1 (calc) (ref <5.0)
Triglycerides: 192 mg/dL — ABNORMAL HIGH (ref <150)

## 2022-05-14 MED ORDER — ONDANSETRON HCL 8 MG PO TABS: 8.0000 mg | ORAL_TABLET | Freq: Three times a day (TID) | ORAL | 1 refills | Status: DC | PRN

## 2022-05-14 MED ORDER — ONDANSETRON HCL 8 MG PO TABS: 8.0000 mg | ORAL_TABLET | Freq: Three times a day (TID) | ORAL | 2 refills | Status: DC | PRN

## 2022-05-14 MED ORDER — PROCHLORPERAZINE MALEATE 10 MG PO TABS: 10.0000 mg | ORAL_TABLET | Freq: Four times a day (QID) | ORAL | 1 refills | Status: DC | PRN

## 2022-05-14 MED ORDER — PROCHLORPERAZINE MALEATE 10 MG PO TABS: 10.0000 mg | ORAL_TABLET | Freq: Four times a day (QID) | ORAL | 2 refills | Status: DC | PRN

## 2022-05-14 NOTE — Progress Notes (Signed)
START ON PATHWAY REGIMEN - Gastroesophageal     Administer weekly during RT:     Paclitaxel      Carboplatin   **Always confirm dose/schedule in your pharmacy ordering system**  Patient Characteristics: Esophageal & GE Junction, Adenocarcinoma, Preoperative or Nonsurgical Candidate, M0 (Clinical Staging), cT1, cN0, Unresectable/Nonsurgical Candidate Therapeutic Status: Preoperative or Nonsurgical Candidate, M0 (Clinical Staging) Histology: Adenocarcinoma Disease Classification: Esophageal AJCC Grade: G2 AJCC 8 Stage Grouping: I AJCC T Category: cT1 AJCC N Category: cN0 AJCC M Category: cM0 Patient Characteristics: Unresectable/Nonsurgical Candidate Intent of Therapy: Curative Intent, Discussed with Patient

## 2022-05-14 NOTE — Patient Instructions (Signed)
-  The sample (biopsy) that they took of your tumor was consistent with Adenocarcinoma -We covered a lot of important information at your appointment today regarding what the treatment plan is moving forward. Here are the the main points that were discussed at your office visit with [place] today:  -The treatment that you will receive consists of two chemotherapy drugs called Carboplatin and Paclitaxel (also called Taxol) -We are planning on starting your treatment on 05/24/22 but before your start your treatment, I would like you to attend a Chemotherapy Education Class. This involves having you sit down with one of our nurse educators. She will discuss with you one-on-one more details about your treatment as well as general information about resources here at the [facility] treatment will be given every week for about 6 weeks or so (as long as you are also receiving radiation). We will check your labs (blood work) once a week .   Medications:  -Compazine was sent to your pharmacy. This medication is for nausea. You may take this every 6 hours as needed if you feel nausous.  -Zofran once every 8 hours as needed starting on day 3 for nausea  Referrals  Follow Up:  -We will see you back for a follow up visit on the first day of treatment.

## 2022-05-14 NOTE — Progress Notes (Signed)
I met with Bruce Graham after  his follow up appointment with Dr Burr Medico.  I explained my role as a nurse navigator and provided my contact information.  I explained the services provided at [facility] and provided written information.  I explained the alight grant and let  him know one of the financial advisors will reach out to  him at the time of his chemo education class. I told him that he will be scheduled for chemotherapy education class prior to receiving chemotherapy.  I told him our schedulers will call him with those appts.  All questions were answered. He verbalized understanding.

## 2022-05-15 ENCOUNTER — Other Ambulatory Visit: Payer: Self-pay

## 2022-05-17 ENCOUNTER — Telehealth: Payer: Self-pay | Admitting: Hematology

## 2022-05-17 ENCOUNTER — Other Ambulatory Visit: Payer: Self-pay | Admitting: Thoracic Surgery (Cardiothoracic Vascular Surgery)

## 2022-05-17 DIAGNOSIS — C159 Malignant neoplasm of esophagus, unspecified: Secondary | ICD-10-CM

## 2022-05-17 NOTE — Telephone Encounter (Signed)
Scheduled per 11/17, patient has been called and notified of all upcoming appointments.

## 2022-05-18 ENCOUNTER — Ambulatory Visit (INDEPENDENT_AMBULATORY_CARE_PROVIDER_SITE_OTHER): Payer: BC Managed Care – PPO | Admitting: Thoracic Surgery (Cardiothoracic Vascular Surgery)

## 2022-05-18 ENCOUNTER — Encounter: Payer: Self-pay | Admitting: Thoracic Surgery (Cardiothoracic Vascular Surgery)

## 2022-05-18 ENCOUNTER — Ambulatory Visit
Admission: RE | Admit: 2022-05-18 | Discharge: 2022-05-18 | Disposition: A | Discharge status: Home / Self Care | Payer: BC Managed Care – PPO | Source: Ambulatory Visit | Attending: Thoracic Surgery (Cardiothoracic Vascular Surgery) | Admitting: Thoracic Surgery (Cardiothoracic Vascular Surgery)

## 2022-05-18 VITALS — BP 147/78 | HR 77 | Resp 20 | Ht 70.0 in | Wt 231.0 lb

## 2022-05-18 DIAGNOSIS — C159 Malignant neoplasm of esophagus, unspecified: Secondary | ICD-10-CM

## 2022-05-18 DIAGNOSIS — Z8709 Personal history of other diseases of the respiratory system: Secondary | ICD-10-CM | POA: Diagnosis not present

## 2022-05-18 DIAGNOSIS — Z9889 Other specified postprocedural states: Secondary | ICD-10-CM | POA: Diagnosis not present

## 2022-05-18 NOTE — Progress Notes (Signed)
[address]       [place] [address]             [phone]     HPI: Bruce Graham returns after recent resection of blebs and bulla from right lung.  Bruce Graham is a 51 year old man with a history of tobacco abuse, COPD, reflux, Barrett's, stage Ib esophageal adenocarcinoma, hypertension, hyperlipidemia, and hidradenitis.  He presented with right-sided chest pain and shortness of breath.  Found to have a right pneumothorax.  CT showed severe emphysema with multiple large blebs and bullae.  He had a pigtail catheter placed.  Given his high risk of recurrence, it was recommended that he undergo bleb and bulla resection.  He was in the process of being evaluated for esophagectomy so we opted not to do any pleurodesis procedure.  I did a VATS with resection of multiple blebs a large apical bulla on 04/29/2022.  His postoperative course was uncomplicated and he went home on day 3.  He feels well.  He is back at work.  He has an occasional sharp pain with certain movements, but otherwise is not having any pain.  The pain really resolves before he can take anything for it.  Not using any narcotics.  No respiratory problems.  Stop smoking Halloween.  Is using nicotine replacement.  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 8 g 2   amLODipine (NORVASC) 2.5 MG tablet Take 1 tablet (2.5 mg total) by mouth daily. 90 tablet 1   doxycycline (VIBRA-TABS) 100 MG tablet Take 1 tablet (100 mg total) by mouth 2 (two) times daily. 14 tablet 0   ezetimibe (ZETIA) 10 MG tablet TAKE 1 TABLET BY MOUTH ONCE DAILY 90 tablet 3   ondansetron (ZOFRAN) 8 MG tablet Take 1 tablet (8 mg total) by mouth every 8 (eight) hours as needed for nausea or vomiting. Starting from day 3 30 tablet 2   ondansetron (ZOFRAN) 8 MG tablet Take 1 tablet (8 mg total) by mouth every 8 (eight) hours as needed for nausea or vomiting. Start on the third day after chemotherapy. 30 tablet 1   pantoprazole (PROTONIX) 40 MG tablet Take 1 tablet (40 mg total) by mouth daily. 30 tablet 0   predniSONE (DELTASONE) 10 MG tablet 6 tablets on Day 1 , then reduce by 1 tablet daily until gone 21 tablet 0   prochlorperazine (COMPAZINE) 10 MG tablet Take 1 tablet (10 mg total) by mouth every 6 (six) hours as needed. 30 tablet 2   prochlorperazine (COMPAZINE) 10 MG tablet Take 1 tablet (10 mg total) by  mouth every 6 (six) hours as needed for nausea or vomiting. 30 tablet 1   No current facility-administered medications for this visit.    Physical Exam BP (!) 147/78 (BP Location: Left Arm, Patient Position: Sitting, Cuff Size: Large)   Pulse 77   Resp 20   Ht 5' 10"  (1.778 m)   Wt 231 lb (104.8 kg)   SpO2 96% Comment: RA  BMI 33.15 kg/m  Well-appearing 51 year old man in no acute distress Alert and oriented x3 with no focal deficits Lungs equal breath sounds bilaterally Incisions well-healed Cardiac regular rate and rhythm  Diagnostic Tests: CHEST - 2 VIEW   COMPARISON:  Chest 05/02/2022   FINDINGS: Surgical staples in the right apex. No pneumothorax. The right hemidiaphragm remains elevated. Mild right lower lobe atelectasis  or infiltrate slightly more prominent. No effusion   Left lung is clear.  Left apical blebs.  Negative for heart failure   IMPRESSION: 1. Postop changes right apex. No pneumothorax. 2. Mild right lower lobe atelectasis or infiltrate.     Electronically Signed   By: Franchot Gallo M.D.   On: 05/18/2022 11:37 I personally reviewed the chest x-ray images.  No pneumothorax.  Some atelectasis in the right middle lobe  Impression: Bruce Graham is a 51 year old man with a history of tobacco abuse, COPD, bullous emphysema, reflux, Barrett's, stage Ib esophageal adenocarcinoma, hypertension, hyperlipidemia, hidradenitis, and spontaneous right pneumothorax.    Spontaneous pneumothorax-status post resection of multiple blebs and a large apical bulla.  Now about 3 weeks out from surgery.  No recurrent pneumothorax.  He does understand there is a risk of recurrence.  We did not do a pleurodesis because he was considering esophagectomy.  Tobacco abuse-quit smoking Halloween and has not smoked since discharge from the hospital.  I congratulated him for that.  He understands the importance of continued abstinence from multiple viewpoints.  Esophageal cancer-stage Ib adenocarcinoma.  Opted for chemoradiation over surgical resection.  Currently under the care of Dr. Burr Medico and Dr. Lisbeth Renshaw  Plan: I will be happy to see Bruce Graham back anytime in the future if I can be of any further assistance with his care  [name], MD [facility] [phone]

## 2022-05-19 ENCOUNTER — Other Ambulatory Visit: Payer: Self-pay

## 2022-05-19 ENCOUNTER — Inpatient Hospital Stay (HOSPITAL_BASED_OUTPATIENT_CLINIC_OR_DEPARTMENT_OTHER): Payer: BC Managed Care – PPO

## 2022-05-21 MED FILL — Dexamethasone Sodium Phosphate Inj 100 MG/10ML: INTRAMUSCULAR | Qty: 1 | Status: AC

## 2022-05-23 DIAGNOSIS — I10 Essential (primary) hypertension: Secondary | ICD-10-CM | POA: Diagnosis not present

## 2022-05-23 DIAGNOSIS — C155 Malignant neoplasm of lower third of esophagus: Secondary | ICD-10-CM | POA: Diagnosis not present

## 2022-05-23 DIAGNOSIS — Z87891 Personal history of nicotine dependence: Secondary | ICD-10-CM | POA: Diagnosis not present

## 2022-05-23 DIAGNOSIS — Z5111 Encounter for antineoplastic chemotherapy: Secondary | ICD-10-CM | POA: Diagnosis not present

## 2022-05-23 DIAGNOSIS — Z510 Encounter for antineoplastic radiation therapy: Secondary | ICD-10-CM | POA: Diagnosis not present

## 2022-05-23 DIAGNOSIS — J439 Emphysema, unspecified: Secondary | ICD-10-CM | POA: Diagnosis not present

## 2022-05-24 ENCOUNTER — Encounter: Payer: Self-pay | Admitting: Hematology

## 2022-05-24 ENCOUNTER — Ambulatory Visit
Admission: RE | Admit: 2022-05-24 | Discharge: 2022-05-24 | Disposition: A | Discharge status: Home / Self Care | Payer: BC Managed Care – PPO | Source: Ambulatory Visit | Attending: Radiation Oncology | Admitting: Radiation Oncology

## 2022-05-24 ENCOUNTER — Other Ambulatory Visit: Payer: BC Managed Care – PPO

## 2022-05-24 ENCOUNTER — Other Ambulatory Visit: Payer: Self-pay

## 2022-05-24 ENCOUNTER — Inpatient Hospital Stay: Payer: BC Managed Care – PPO

## 2022-05-24 ENCOUNTER — Inpatient Hospital Stay: Payer: BC Managed Care – PPO | Admitting: Hematology

## 2022-05-24 VITALS — BP 143/97 | HR 76 | Temp 97.7°F | Resp 18 | Wt 228.2 lb

## 2022-05-24 DIAGNOSIS — Z510 Encounter for antineoplastic radiation therapy: Secondary | ICD-10-CM | POA: Diagnosis not present

## 2022-05-24 DIAGNOSIS — C155 Malignant neoplasm of lower third of esophagus: Secondary | ICD-10-CM

## 2022-05-24 DIAGNOSIS — I10 Essential (primary) hypertension: Secondary | ICD-10-CM | POA: Diagnosis not present

## 2022-05-24 DIAGNOSIS — Z87891 Personal history of nicotine dependence: Secondary | ICD-10-CM | POA: Diagnosis not present

## 2022-05-24 DIAGNOSIS — J439 Emphysema, unspecified: Secondary | ICD-10-CM | POA: Diagnosis not present

## 2022-05-24 DIAGNOSIS — Z5111 Encounter for antineoplastic chemotherapy: Secondary | ICD-10-CM | POA: Diagnosis not present

## 2022-05-24 LAB — RAD ONC ARIA SESSION SUMMARY
Course Elapsed Days: 0
Plan Fractions Treated to Date: 1
Plan Prescribed Dose Per Fraction: 1.8 Gy
Plan Total Fractions Prescribed: 25
Plan Total Prescribed Dose: 45 Gy
Reference Point Dosage Given to Date: 1.8 Gy
Reference Point Session Dosage Given: 1.8 Gy
Session Number: 1

## 2022-05-24 LAB — CMP (CANCER CENTER ONLY)
ALT: 11 U/L (ref 0–44)
AST: 14 U/L — ABNORMAL LOW (ref 15–41)
Albumin: 4.1 g/dL (ref 3.5–5.0)
Alkaline Phosphatase: 77 U/L (ref 38–126)
Anion gap: 7 (ref 5–15)
BUN: 11 mg/dL (ref 6–20)
CO2: 26 mmol/L (ref 22–32)
Calcium: 9.5 mg/dL (ref 8.9–10.3)
Chloride: 104 mmol/L (ref 98–111)
Creatinine: 0.95 mg/dL (ref 0.61–1.24)
GFR, Estimated: >60 mL/min (ref >60)
Glucose, Bld: 108 mg/dL — ABNORMAL HIGH (ref 70–99)
Potassium: 3.7 mmol/L (ref 3.5–5.1)
Sodium: 137 mmol/L (ref 135–145)
Total Bilirubin: 0.3 mg/dL (ref 0.3–1.2)
Total Protein: 8.1 g/dL (ref 6.5–8.1)

## 2022-05-24 LAB — CBC WITH DIFFERENTIAL (CANCER CENTER ONLY)
Abs Immature Granulocytes: 0.06 10*3/uL (ref 0.00–0.07)
Basophils Absolute: 0 10*3/uL (ref 0.0–0.1)
Basophils Relative: 0 %
Eosinophils Absolute: 0.2 10*3/uL (ref 0.0–0.5)
Eosinophils Relative: 2 %
HCT: 42.5 % (ref 39.0–52.0)
Hemoglobin: 13.6 g/dL (ref 13.0–17.0)
Immature Granulocytes: 1 %
Lymphocytes Relative: 23 %
Lymphs Abs: 2.7 10*3/uL (ref 0.7–4.0)
MCH: 26.2 pg (ref 26.0–34.0)
MCHC: 32 g/dL (ref 30.0–36.0)
MCV: 81.7 fL (ref 80.0–100.0)
Monocytes Absolute: 0.9 10*3/uL (ref 0.1–1.0)
Monocytes Relative: 8 %
Neutro Abs: 7.5 10*3/uL (ref 1.7–7.7)
Neutrophils Relative %: 66 %
Platelet Count: 427 10*3/uL — ABNORMAL HIGH (ref 150–400)
RBC: 5.2 MIL/uL (ref 4.22–5.81)
RDW: 16.5 % — ABNORMAL HIGH (ref 11.5–15.5)
WBC Count: 11.4 10*3/uL — ABNORMAL HIGH (ref 4.0–10.5)
nRBC: 0 % (ref 0.0–0.2)

## 2022-05-24 LAB — SURGICAL PATHOLOGY

## 2022-05-24 MED ORDER — SODIUM CHLORIDE 0.9 % IV SOLN
10.0000 mg | Freq: Once | INTRAVENOUS | Status: AC
  Administered 2022-05-24: 10 mg via INTRAVENOUS
  Filled 2022-05-24: qty 10

## 2022-05-24 MED ORDER — FAMOTIDINE IN NACL 20-0.9 MG/50ML-% IV SOLN
20.0000 mg | Freq: Once | INTRAVENOUS | Status: AC
  Administered 2022-05-24: 20 mg via INTRAVENOUS
  Filled 2022-05-24: qty 50

## 2022-05-24 MED ORDER — DIPHENHYDRAMINE HCL 50 MG/ML IJ SOLN
50.0000 mg | Freq: Once | INTRAMUSCULAR | Status: AC
  Administered 2022-05-24: 50 mg via INTRAVENOUS
  Filled 2022-05-24: qty 1

## 2022-05-24 MED ORDER — SODIUM CHLORIDE 0.9 % IV SOLN: Freq: Once | INTRAVENOUS | Status: AC

## 2022-05-24 MED ORDER — SODIUM CHLORIDE 0.9 % IV SOLN
300.0000 mg | Freq: Once | INTRAVENOUS | Status: AC
  Administered 2022-05-24: 300 mg via INTRAVENOUS
  Filled 2022-05-24: qty 30

## 2022-05-24 MED ORDER — PALONOSETRON HCL INJECTION 0.25 MG/5ML
0.2500 mg | Freq: Once | INTRAVENOUS | Status: AC
  Administered 2022-05-24: 0.25 mg via INTRAVENOUS
  Filled 2022-05-24: qty 5

## 2022-05-24 MED ORDER — SODIUM CHLORIDE 0.9 % IV SOLN
50.0000 mg/m2 | Freq: Once | INTRAVENOUS | Status: AC
  Administered 2022-05-24: 114 mg via INTRAVENOUS
  Filled 2022-05-24: qty 19

## 2022-05-24 NOTE — Patient Instructions (Signed)
[facility] ONCOLOGY  Discharge Instructions: Thank you for choosing [facility] to provide your oncology and hematology care.   If you have a lab appointment with the [facility], please go directly to the [facility] and check in at the registration area.   Wear comfortable clothing and clothing appropriate for easy access to any Portacath or PICC line.   We strive to give you quality time with your provider. You may need to reschedule your appointment if you arrive late (15 or more minutes).  Arriving late affects you and other patients whose appointments are after yours.  Also, if you miss three or more appointments without notifying the office, you may be dismissed from the clinic at the provider's discretion.      For prescription refill requests, have your pharmacy contact our office and allow 72 hours for refills to be completed.    Today you received the following chemotherapy and/or immunotherapy agents: Paclitaxel (Taxol) & Carboplatin     To help prevent nausea and vomiting after your treatment, we encourage you to take your nausea medication as directed.  BELOW ARE SYMPTOMS THAT SHOULD BE REPORTED IMMEDIATELY: *FEVER GREATER THAN 100.4 F (38 C) OR HIGHER *CHILLS OR SWEATING *NAUSEA AND VOMITING THAT IS NOT CONTROLLED WITH YOUR NAUSEA MEDICATION *UNUSUAL SHORTNESS OF BREATH *UNUSUAL BRUISING OR BLEEDING *URINARY PROBLEMS (pain or burning when urinating, or frequent urination) *BOWEL PROBLEMS (unusual diarrhea, constipation, pain near the anus) TENDERNESS IN MOUTH AND THROAT WITH OR WITHOUT PRESENCE OF ULCERS (sore throat, sores in mouth, or a toothache) UNUSUAL RASH, SWELLING OR PAIN  UNUSUAL VAGINAL DISCHARGE OR ITCHING   Items with * indicate a potential emergency and should be followed up as soon as possible or go to the Emergency Department if any problems should occur.  Please show the CHEMOTHERAPY ALERT CARD or IMMUNOTHERAPY  ALERT CARD at check-in to the Emergency Department and triage nurse.  Should you have questions after your visit or need to cancel or reschedule your appointment, please contact [facility]  Dept: [phone]  and follow the prompts.  Office hours are 8:00 a.m. to 4:30 p.m. Monday - Friday. Please note that voicemails left after 4:00 p.m. may not be returned until the following business day.  We are closed weekends and major holidays. You have access to a nurse at all times for urgent questions. Please call the main number to the clinic Dept: [phone] and follow the prompts.   For any non-urgent questions, you may also contact your provider using MyChart. We now offer e-Visits for anyone 59 and older to request care online for non-urgent symptoms. For details visit [email or web address].   Also download the MyChart app! Go to the app store, search "MyChart", open the app, select [facility], and log in with your MyChart username and password.  Masks are optional in the cancer centers. If you would like for your care team to wear a mask while they are taking care of you, please let them know. You may have one support person who is at least 52 years old accompany you for your appointments.

## 2022-05-24 NOTE — Progress Notes (Signed)
[place]   [phone] [phone]   Clinic Follow up Note   Patient Care Team: Crecencio Mc, MD as PCP - General (Internal Medicine) Bary Castilla [name], MD as Consulting Physician (General Surgery) Lesly Rubenstein, MD as Consulting Physician (Gastroenterology) Lerry Paterson, MD as Consulting Physician (Thoracic Surgery) Truitt Merle, MD as Consulting Physician (Hematology)  Date of Service:  05/24/2022  CHIEF COMPLAINT: f/u of Esophageal Cancer    CURRENT THERAPY:  Concurrent chemoradiation with carboplatin and paclitaxel weekly.  First dose expected on 05/24/2022      ASSESSMENT:  Bruce Graham is a 51 y.o. male with   1. Esophageal cancer, cT1N0M0, stage MMR normal, HER2(+), PD-L1 2% -h/o Barrett's esophagus. Screening EGD on 12/16/21 showed only reflux esophagitis, no bleeding. Pathology revealed invasive moderately differentiated adenocarcinoma at 36 cm. No other sites of malignancy. -staging chest CT 12/25/21 was negative outside of distal esophageal wall thickening. -EUS 01/04/22, unable to stage due to no obvious mass. -staging PET scan 01/28/22 showed no FDG-avid disease. -Pt declined surgery, and wants to try chemoRT -For treatment, he is scheduled to start chemotherapy and radiation today.    PLAN:  -Start ChemoCT today with weekly carbo/taxol  -lab reviewed, adequate for treatment  -f/u next week       SUMMARY OF ONCOLOGIC HISTORY: Oncology History  Esophageal cancer ([place])  12/16/2021 Procedure   Upper GI endoscopy, Dr. Bary Castilla  Impression: - LA Grade C reflux esophagitis with no bleeding. Rule out Barrett's esophagus. Biopsied. - Small hiatal hernia. - Normal examined duodenum.   12/16/2021 Initial Biopsy   DIAGNOSIS:  A. ESOPHAGUS, 40 CM; COLD BIOPSY:  - SQUAMOCOLUMNAR MUCOSA WITH INTESTINAL METAPLASIA, COMPATIBLE WITH HISTORY OF BARRETT'S ESOPHAGUS, WITH FOCAL LOW GRADE DYSPLASIA.  - NEGATIVE FOR HIGH-GRADE  DYSPLASIA AND MALIGNANCY.   B. ESOPHAGUS, 38 CM; BIOPSY:  - SQUAMOCOLUMNAR MUCOSA WITH INTESTINAL METAPLASIA, COMPATIBLE WITH HISTORY OF BARRETT'S ESOPHAGUS, WITH FOCAL LOW GRADE DYSPLASIA.  - NEGATIVE FOR HIGH-GRADE DYSPLASIA AND MALIGNANCY.   C. ESOPHAGUS, 36 CM; COLD BIOPSY:  - INVASIVE MODERATELY DIFFERENTIATED ADENOCARCINOMA, ARISING IN A BACKGROUND OF BARRETT'S ESOPHAGUS WITH HIGH-GRADE DYSPLASIA.   D. COLON, CECUM; COLD BIOPSY:  - CHRONIC COLITIS WITH MODERATE ACTIVITY (CRYPTITIS AND CRYPT ABSCESSES).  - NEGATIVE FOR GRANULOMA, DYSPLASIA, AND MALIGNANCY.   E. COLON, DESCENDING; COLD BIOPSY:  - CHRONIC COLITIS WITH MODERATE ACTIVITY (CRYPTITIS AND CRYPT ABSCESSES). - NEGATIVE FOR GRANULOMA, DYSPLASIA, AND MALIGNANCY.   F. RECTUM; COLD BIOPSY:  - CHRONIC PROCTITIS WITH MODERATE ACTIVITY (CRYPTITIS AND CRYPT ABSCESSES).  - NEGATIVE FOR GRANULOMA, DYSPLASIA, AND MALIGNANCY.   Comment:  Biopsy sections taken from the cecum, descending colon, and rectum display crypt architectural abnormalities (crypt branching and bifid crypts), basal lymphoplasmacytosis, and active intramucosal inflammation ranging from cryptitis to crypt abscesses.  The features are highly suggestive of an evolving inflammatory bowel disease.  Clinical and endoscopic correlation is recommended.    12/16/2021 Cancer Staging   Staging form: Esophagus - Adenocarcinoma, AJCC 8th Edition - Clinical stage from 12/16/2021: Stage I (cT1b, cN0, cM0, G2) - Signed by Truitt Merle, MD on 04/27/2022 Stage prefix: Initial diagnosis Total positive nodes: 0 Histologic grading system: 3 grade system Type of surgical resection with curative intent: Endoscopic mucosal resection   12/17/2021 Initial Diagnosis   Esophageal cancer ([place])   12/25/2021 Imaging   EXAM: CT CHEST WITH CONTRAST  IMPRESSION: 1. Moderate wall thickening involving the distal esophagus near the GE junction but no paraesophageal or upper  abdominal lymphadenopathy.  2. No findings for pulmonary metastatic disease. 3. Age advanced atherosclerotic calcifications involving the coronary arteries. 4. Advanced emphysematous changes with paraseptal bulla.   Aortic Atherosclerosis (ICD10-I70.0) and Emphysema (ICD10-J43.9).   01/04/2022 Procedure   Upper EUS, Dr. Mont Dutton  EGD Impressions: - Esophageal mucosal changes consistent with long-segment Barrett's esophagus. - Cratered esophageal ulcer at 40 cm (up to 8 mm in largest dimension). Biopsied. Suspect this is the site of previous biopsy proven adenocarcinoma. - Normal stomach. - Normal examined duodenum.   EUS Impressions: - There was no sign of significant pathology in the esophagus. No obvious esophageal mass was seen. Unable to T stage due to no mass lesion seen. - No periesophageal or perigastric lymph nodes visualized. - Normal visualized portions of the liver. - Normal celiac region.   01/04/2022 Pathology Results   DIAGNOSIS    A. Esophagus, 40 cm, esophageal ulcer, endoscopic biopsy:   Barrett esophagus with high-grade dysplasia. (See comment)   Cytomegalovirus esophagitis.    Comment: A p53 immunostain demonstrates loss of p53 expression within the atypical gastric mucosa, consistent with TP53 null mutant, supporting a diagnosis of high-grade dysplasia. CMV immunohistochemistry highlights rare positive cells, corroborated by viral cytopathic effect on H&E. PASD stain is negative for fungal organisms. Multiple step sections are examined.        01/28/2022 PET scan   FDG PET SBMT W NONDIAGNOSTIC CONCURRENT CT INITIAL   IMPRESSION:  1.  No evidence of FDG avid disease.   2.  Gluteal cleft findings favored to infectious/inflammatory, correlate with physical exam.    03/15/2022 Pathology Results   DIAGNOSIS    A. Esophagus, 36 cm, endomucosal resection:   Invasive, moderately differentiated adenocarcinoma. Tumor is seen throughout a thickened muscularis  mucosal layer. Only a small amount of submucosa is present, best seen in block A2, and here tumor is present within the submucosa. Cauterized tumor is present at the deep margin (orange ink) and a peripheral margin (blue ink). Negative for lymphovascular and perineural invasion. See summary in synoptic report.   B. Esophagus, 36 cm #2, endomucosal resection:   Invasive, moderately-differentiated adenocarcinoma. Tumor is seen throughout a thickened muscularis mucosal layer. A good representation of submucosal tissue is also present, with small areas of tumor seen in submucosal tissue and focally at the deep (orange ink) margin. Tumor is also present at a peripheral margin (blue ink). Negative for lymphovascular and perineural invasion. See summary in synoptic report.   C. Esophagus, 36 cm #3, endomucosal resection:   Invasive moderately differentiated adenocarcinoma. Tumor is focally seen in a thickened muscularis mucosal layer. A mucous gland is present but no submucosal tissue is present in this small specimen. Cauterized tumor is present at orange ink (the margin of this small specimen is entirely inked orange). Negative for lymphovascular and perineural invasion. See summary in synoptic report.   D. Esophagus, 39 cm, biopsy:   Gastric type mucosa. No squamous mucosa is seen. Negative for intestinal metaplasia.  Negative for dysplasia.   E. Esophagus, 37 cm, biopsy:    Focal area concerning for high grade dysplasia is seen in a background of squamocolumnar junctional mucosa with goblet cell intestinal metaplasia (Barrett's esophagus).  The focal area is best seen on level 1, deeper levels 3-5 do not demonstrate the concerning area. Negative for malignancy.   Comment:  See the synoptic report for summary.  While I very much suspect the T-stage is pT1b (submucosa), this is technically considered indefinite in the presence of positive deep margins. MSI  testing has been requested on  block B2 of this case; results will be issued separately.     05/24/2022 -  Chemotherapy   Patient is on Treatment Plan : ESOPHAGUS Carboplatin + Paclitaxel Weekly X 6 Weeks with XRT        INTERVAL HISTORY:  Bruce Graham is here for a follow up of  Esophageal Cancer  He was last seen by me w/PA Cassie on 05/14/2022 He presents to the clinic alone.Pt had a question about Tumor marker.Pt states breathing is ok.  All other systems were reviewed with the patient and are negative.  MEDICAL HISTORY:  Past Medical History:  Diagnosis Date   Abscess of right thigh 06/16/2015   Barrett esophagus    Bronchitis    Complication of anesthesia    hard to wake up after hernia surgery   Ear infection 12/13/2017   taking amoxicillin   Family history of adverse reaction to anesthesia    mom hard to wake up    GERD (gastroesophageal reflux disease)    HLD (hyperlipidemia)    Hydradenitis    Left leg   Hypertension    Intervertebral disc disorder    Myalgia    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/07/2015   Suppurative hidradenitis    Umbilical hernia without obstruction and without gangrene 11/22/2017    SURGICAL HISTORY: Past Surgical History:  Procedure Laterality Date   COLONOSCOPY WITH PROPOFOL N/A 03/06/2018   Procedure: COLONOSCOPY WITH PROPOFOL;  Surgeon: Lin Landsman, MD;  Location: [facility] ENDOSCOPY;  Service: Gastroenterology;  Laterality: N/A;   COLONOSCOPY WITH PROPOFOL N/A 12/16/2021   Procedure: COLONOSCOPY WITH PROPOFOL;  Surgeon: Robert Bellow, MD;  Location: [facility] ENDOSCOPY;  Service: Endoscopy;  Laterality: N/A;   ESOPHAGOGASTRODUODENOSCOPY (EGD) WITH PROPOFOL N/A 03/06/2018   Procedure: ESOPHAGOGASTRODUODENOSCOPY (EGD) WITH PROPOFOL;  Surgeon: Lin Landsman, MD;  Location[facility] ENDOSCOPY;  Service: Gastroenterology;  Laterality: N/A;   ESOPHAGOGASTRODUODENOSCOPY (EGD) WITH PROPOFOL N/A 12/16/2021   Procedure: ESOPHAGOGASTRODUODENOSCOPY (EGD) WITH PROPOFOL;  Surgeon:  Robert Bellow, MD;  Location: [facility] ENDOSCOPY;  Service: Endoscopy;  Laterality: N/A;   HERNIA REPAIR     INCISION AND DRAINAGE ABSCESS N/A 05/02/2020   Procedure: INCISION AND DRAINAGE ABSCESS;  Surgeon: Robert Bellow, MD;  Location: [facility] ORS;  Service: General;  Laterality: N/A;   INCISION AND DRAINAGE PERIRECTAL ABSCESS Right 06/16/2015   Procedure: IRRIGATION AND DEBRIDEMENT right inner thigh ABSCESS;  Surgeon: Florene Glen, MD;  Location: [facility] ORS;  Service: General;  Laterality: Right;   INSERTION OF MESH N/A 12/20/2017   Procedure: INSERTION OF MESH;  Surgeon: [name], MD;  Location: [facility] ORS;  Service: General;  Laterality: N/A;   KNEE ARTHROSCOPY WITH ANTERIOR CRUCIATE LIGAMENT (ACL) REPAIR Left 2002   KNEE SURGERY Left    PILONIDAL CYST EXCISION N/A 05/02/2020   Procedure: CYST EXCISION PILONIDAL EXTENSIVE;  Surgeon: Robert Bellow, MD;  Location: [facility] ORS;  Service: General;  Laterality: N/A;   UMBILICAL HERNIA REPAIR N/A 12/20/2017   Procedure: HERNIA REPAIR UMBILICAL ADULT;  Surgeon: [name], MD;  Location: [facility] ORS;  Service: General;  Laterality: N/A;   VIDEO ASSISTED THORACOSCOPY (VATS)/WEDGE RESECTION Right 04/29/2022   Procedure: RIGHT VIDEO ASSISTED THORACOSCOPY (VATS)/ STAPELING OF BLEBS;  Surgeon: [name], MD;  Location: [facility];  Service: Thoracic;  Laterality: Right;    I have reviewed the social history and family history with the patient and they are unchanged from previous note.  ALLERGIES:  has No  Known Allergies.  MEDICATIONS:  Current Outpatient Medications  Medication Sig Dispense Refill   albuterol (VENTOLIN HFA) 108 (90 Base) MCG/ACT inhaler Inhale 2 puffs into the lungs every 6 (six) hours as needed for wheezing or shortness of breath. 8 g 2   amLODipine (NORVASC) 2.5 MG tablet Take 1 tablet (2.5 mg total) by mouth daily. 90 tablet 1   doxycycline (VIBRA-TABS) 100 MG tablet Take 1 tablet (100 mg total) by  mouth 2 (two) times daily. (Patient not taking: Reported on 05/24/2022) 14 tablet 0   ezetimibe (ZETIA) 10 MG tablet TAKE 1 TABLET BY MOUTH ONCE DAILY 90 tablet 3   ondansetron (ZOFRAN) 8 MG tablet Take 1 tablet (8 mg total) by mouth every 8 (eight) hours as needed for nausea or vomiting. Starting from day 3 30 tablet 2   ondansetron (ZOFRAN) 8 MG tablet Take 1 tablet (8 mg total) by mouth every 8 (eight) hours as needed for nausea or vomiting. Start on the third day after chemotherapy. 30 tablet 1   pantoprazole (PROTONIX) 40 MG tablet Take 1 tablet (40 mg total) by mouth daily. 30 tablet 0   predniSONE (DELTASONE) 10 MG tablet 6 tablets on Day 1 , then reduce by 1 tablet daily until gone (Patient not taking: Reported on 05/24/2022) 21 tablet 0   prochlorperazine (COMPAZINE) 10 MG tablet Take 1 tablet (10 mg total) by mouth every 6 (six) hours as needed. 30 tablet 2   prochlorperazine (COMPAZINE) 10 MG tablet Take 1 tablet (10 mg total) by mouth every 6 (six) hours as needed for nausea or vomiting. 30 tablet 1   No current facility-administered medications for this visit.    PHYSICAL EXAMINATION: ECOG PERFORMANCE STATUS: 0 - Asymptomatic  There were no vitals filed for this visit. Wt Readings from Last 3 Encounters:  05/24/22 228 lb 4 oz (103.5 kg)  05/18/22 231 lb (104.8 kg)  05/14/22 226 lb 4.8 oz (102.6 kg)     GENERAL:alert, no distress and comfortable SKIN: skin color normal, no rashes or significant lesions EYES: normal, Conjunctiva are pink and non-injected, sclera clear  NEURO: alert & oriented x 3 with fluent speech  LABORATORY DATA:  I have reviewed the data as listed    Latest Ref Rng & Units 05/24/2022    9:13 AM 05/13/2022   11:48 AM 05/02/2022    2:57 AM  CBC  WBC 4.0 - 10.5 K/uL 11.4  10.4  16.3   Hemoglobin 13.0 - 17.0 g/dL 13.6  12.8  12.5   Hematocrit 39.0 - 52.0 % 42.5  38.5  37.9   Platelets 150 - 400 K/uL 427  720.0  395         Latest Ref Rng & Units  05/24/2022    9:13 AM 05/13/2022   11:48 AM 05/02/2022    2:57 AM  CMP  Glucose 70 - 99 mg/dL 108  103  102   BUN 6 - 20 mg/dL _0 Creatinine 0.61 - 1.24 mg/dL 0.95  0.97  0.87   Sodium 135 - 145 mmol/L 137  136  134   Potassium 3.5 - 5.1 mmol/L 3.7  4.2  3.6   Chloride 98 - 111 mmol/L 104  103  103   CO2 22 - 32 mmol/L _1 Calcium 8.9 - 10.3 mg/dL 9.5  8.9  8.2   Total Protein 6.5 - 8.1 g/dL 8.1     Total Bilirubin 0.3 -  1.2 mg/dL 0.3     Alkaline Phos 38 - 126 U/L 77     AST 15 - 41 U/L 14     ALT 0 - 44 U/L 11         RADIOGRAPHIC STUDIES: I have personally reviewed the radiological images as listed and agreed with the findings in the report. No results found.    No orders of the defined types were placed in this encounter.  All questions were answered. The patient knows to call the clinic with any problems, questions or concerns. No barriers to learning was detected. The total time spent in the appointment was 25 minutes.     Truitt Merle, MD 05/24/2022   Felicity Coyer, CMA, am acting as scribe for Truitt Merle, MD.   I have reviewed the above documentation for accuracy and completeness, and I agree with the above.

## 2022-05-25 ENCOUNTER — Ambulatory Visit: Payer: BC Managed Care – PPO | Admitting: Hematology

## 2022-05-25 ENCOUNTER — Ambulatory Visit: Payer: BC Managed Care – PPO

## 2022-05-25 ENCOUNTER — Ambulatory Visit
Admission: RE | Admit: 2022-05-25 | Discharge: 2022-05-25 | Disposition: A | Discharge status: Home / Self Care | Payer: BC Managed Care – PPO | Source: Ambulatory Visit | Attending: Radiation Oncology | Admitting: Radiation Oncology

## 2022-05-25 ENCOUNTER — Telehealth: Payer: Self-pay

## 2022-05-25 ENCOUNTER — Telehealth: Payer: Self-pay | Admitting: Hematology

## 2022-05-25 ENCOUNTER — Other Ambulatory Visit: Payer: Self-pay

## 2022-05-25 ENCOUNTER — Other Ambulatory Visit: Payer: BC Managed Care – PPO

## 2022-05-25 DIAGNOSIS — Z5111 Encounter for antineoplastic chemotherapy: Secondary | ICD-10-CM | POA: Diagnosis not present

## 2022-05-25 DIAGNOSIS — J439 Emphysema, unspecified: Secondary | ICD-10-CM | POA: Diagnosis not present

## 2022-05-25 DIAGNOSIS — C155 Malignant neoplasm of lower third of esophagus: Secondary | ICD-10-CM | POA: Diagnosis not present

## 2022-05-25 DIAGNOSIS — I10 Essential (primary) hypertension: Secondary | ICD-10-CM | POA: Diagnosis not present

## 2022-05-25 DIAGNOSIS — Z510 Encounter for antineoplastic radiation therapy: Secondary | ICD-10-CM | POA: Diagnosis not present

## 2022-05-25 DIAGNOSIS — Z87891 Personal history of nicotine dependence: Secondary | ICD-10-CM | POA: Diagnosis not present

## 2022-05-25 LAB — RAD ONC ARIA SESSION SUMMARY
Course Elapsed Days: 1
Plan Fractions Treated to Date: 2
Plan Prescribed Dose Per Fraction: 1.8 Gy
Plan Total Fractions Prescribed: 25
Plan Total Prescribed Dose: 45 Gy
Reference Point Dosage Given to Date: 3.6 Gy
Reference Point Session Dosage Given: 1.8 Gy
Session Number: 2

## 2022-05-25 NOTE — Telephone Encounter (Signed)
Bruce Graham states that he is doing fine. He is eating, drinking, and urinating well. He knows to call the office at [phone] if he has any questions or concerns. Reviewed using compression socks of 20/30 compression( Can purchase on line or at e.g. [employer]) and using exam gloves that are available in the infusion room.  He needs wear a glove at least a size smaller then he would wear comfortably to get compression. Pt verbalized understanding.

## 2022-05-25 NOTE — Telephone Encounter (Signed)
-----   Message from Willis Modena, RN sent at 05/24/2022  2:05 PM EST ----- Regarding: Dr. Burr Medico 1st time Taxol/Carbo f/u tol well Dr. Burr Medico 1st time Taxol/Carbo. Pt tolerated tx well without incident. Pt did not tolerate the cryotherapy today, but is interested in compression therapy during future Taxol infusions (could you talk with him about this, thanks!). Pt call back due :)

## 2022-05-25 NOTE — Telephone Encounter (Signed)
Called patient to inform of upcoming appointments. Patient notified.

## 2022-05-26 ENCOUNTER — Other Ambulatory Visit: Payer: Self-pay

## 2022-05-26 ENCOUNTER — Ambulatory Visit
Admission: RE | Admit: 2022-05-26 | Discharge: 2022-05-26 | Disposition: A | Discharge status: Home / Self Care | Payer: BC Managed Care – PPO | Source: Ambulatory Visit | Attending: Radiation Oncology | Admitting: Radiation Oncology

## 2022-05-26 DIAGNOSIS — Z87891 Personal history of nicotine dependence: Secondary | ICD-10-CM | POA: Diagnosis not present

## 2022-05-26 DIAGNOSIS — C155 Malignant neoplasm of lower third of esophagus: Secondary | ICD-10-CM | POA: Diagnosis not present

## 2022-05-26 DIAGNOSIS — I10 Essential (primary) hypertension: Secondary | ICD-10-CM | POA: Diagnosis not present

## 2022-05-26 DIAGNOSIS — Z510 Encounter for antineoplastic radiation therapy: Secondary | ICD-10-CM | POA: Diagnosis not present

## 2022-05-26 DIAGNOSIS — J439 Emphysema, unspecified: Secondary | ICD-10-CM | POA: Diagnosis not present

## 2022-05-26 DIAGNOSIS — Z5111 Encounter for antineoplastic chemotherapy: Secondary | ICD-10-CM | POA: Diagnosis not present

## 2022-05-26 LAB — RAD ONC ARIA SESSION SUMMARY
Course Elapsed Days: 2
Plan Fractions Treated to Date: 3
Plan Prescribed Dose Per Fraction: 1.8 Gy
Plan Total Fractions Prescribed: 25
Plan Total Prescribed Dose: 45 Gy
Reference Point Dosage Given to Date: 5.4 Gy
Reference Point Session Dosage Given: 1.8 Gy
Session Number: 3

## 2022-05-27 ENCOUNTER — Ambulatory Visit
Admission: RE | Admit: 2022-05-27 | Discharge: 2022-05-27 | Disposition: A | Discharge status: Home / Self Care | Payer: BC Managed Care – PPO | Source: Ambulatory Visit | Attending: Radiation Oncology | Admitting: Radiation Oncology

## 2022-05-27 ENCOUNTER — Encounter: Payer: Self-pay | Admitting: Nutrition

## 2022-05-27 ENCOUNTER — Other Ambulatory Visit: Payer: Self-pay

## 2022-05-27 ENCOUNTER — Inpatient Hospital Stay: Payer: BC Managed Care – PPO | Admitting: Nutrition

## 2022-05-27 DIAGNOSIS — Z87891 Personal history of nicotine dependence: Secondary | ICD-10-CM | POA: Diagnosis not present

## 2022-05-27 DIAGNOSIS — J439 Emphysema, unspecified: Secondary | ICD-10-CM | POA: Diagnosis not present

## 2022-05-27 DIAGNOSIS — Z510 Encounter for antineoplastic radiation therapy: Secondary | ICD-10-CM | POA: Diagnosis not present

## 2022-05-27 DIAGNOSIS — C155 Malignant neoplasm of lower third of esophagus: Secondary | ICD-10-CM | POA: Diagnosis not present

## 2022-05-27 DIAGNOSIS — I10 Essential (primary) hypertension: Secondary | ICD-10-CM | POA: Diagnosis not present

## 2022-05-27 DIAGNOSIS — Z5111 Encounter for antineoplastic chemotherapy: Secondary | ICD-10-CM | POA: Diagnosis not present

## 2022-05-27 LAB — RAD ONC ARIA SESSION SUMMARY
Course Elapsed Days: 3
Plan Fractions Treated to Date: 4
Plan Prescribed Dose Per Fraction: 1.8 Gy
Plan Total Fractions Prescribed: 25
Plan Total Prescribed Dose: 45 Gy
Reference Point Dosage Given to Date: 7.2 Gy
Reference Point Session Dosage Given: 1.8 Gy
Session Number: 4

## 2022-05-27 NOTE — Progress Notes (Signed)
Patient did not show up for nutrition appointment. I have rescheduled to Monday, Dec 4 during infusion.

## 2022-05-28 ENCOUNTER — Ambulatory Visit
Admission: RE | Admit: 2022-05-28 | Discharge: 2022-05-28 | Disposition: A | Discharge status: Home / Self Care | Payer: BC Managed Care – PPO | Source: Ambulatory Visit | Attending: Radiation Oncology | Admitting: Radiation Oncology

## 2022-05-28 ENCOUNTER — Other Ambulatory Visit: Payer: Self-pay

## 2022-05-28 DIAGNOSIS — Z510 Encounter for antineoplastic radiation therapy: Secondary | ICD-10-CM | POA: Diagnosis not present

## 2022-05-28 DIAGNOSIS — C155 Malignant neoplasm of lower third of esophagus: Secondary | ICD-10-CM | POA: Diagnosis not present

## 2022-05-28 LAB — RAD ONC ARIA SESSION SUMMARY
Course Elapsed Days: 4
Plan Fractions Treated to Date: 5
Plan Prescribed Dose Per Fraction: 1.8 Gy
Plan Total Fractions Prescribed: 25
Plan Total Prescribed Dose: 45 Gy
Reference Point Dosage Given to Date: 9 Gy
Reference Point Session Dosage Given: 1.8 Gy
Session Number: 5

## 2022-05-28 MED ORDER — SONAFINE EX EMUL
1.0000 | Freq: Once | CUTANEOUS | Status: AC
  Administered 2022-05-28: 1 via TOPICAL

## 2022-05-28 MED FILL — Dexamethasone Sodium Phosphate Inj 100 MG/10ML: INTRAMUSCULAR | Qty: 1 | Status: AC

## 2022-05-28 NOTE — Progress Notes (Unsigned)
[facility]   Telephone:([phone]) [phone] Fax:([phone]) [phone]   Clinic Follow up Note   Patient Care Team: Crecencio Mc, MD as PCP - General (Internal Medicine) Bary Castilla [name], MD as Consulting Physician (General Surgery) Lesly Rubenstein, MD as Consulting Physician (Gastroenterology) Lerry Paterson, MD as Consulting Physician (Thoracic Surgery) Truitt Merle, MD as Consulting Physician (Hematology)  Date of Service:  05/31/2022  CHIEF COMPLAINT: f/u of Esophageal Cancer      CURRENT THERAPY:  Concurrent chemoradiation with carboplatin and paclitaxel weekly.  First dose expected on 05/24/2022   ASSESSMENT:  Bruce Graham is a 51 y.o. 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 Pathology revealed invasive moderately differentiated adenocarcinoma at 36 cm  --staging PET scan 01/28/22 showed no FDG-avid disease. -Pt declined surgery, and wants to try chemoRT -Pt started concurrent chemotherapy and radiation 05/24/2022.      PLAN:  Lab reviewed , adequate for treatment, Bruce proceed week 2 chemo today  -continue RT  -Call in Xanax 2 bid PRN for anxiety, side effects reviewed  -f/u and treatment in a week, he Bruce see NP Cassie next time   SUMMARY OF ONCOLOGIC HISTORY: Oncology History  Esophageal cancer ([place])  12/16/2021 Procedure   Upper GI endoscopy, Dr. Bary Castilla  Impression: - LA Grade C reflux esophagitis with no bleeding. Rule out Barrett's esophagus. Biopsied. - Small hiatal hernia. - Normal examined duodenum.   12/16/2021 Initial Biopsy   DIAGNOSIS:  A. ESOPHAGUS, 40 CM; COLD BIOPSY:  - SQUAMOCOLUMNAR MUCOSA WITH INTESTINAL METAPLASIA, COMPATIBLE WITH HISTORY OF BARRETT'S ESOPHAGUS, WITH FOCAL LOW GRADE DYSPLASIA.  - NEGATIVE FOR HIGH-GRADE DYSPLASIA AND MALIGNANCY.   B. ESOPHAGUS, 38 CM; BIOPSY:  -  SQUAMOCOLUMNAR MUCOSA WITH INTESTINAL METAPLASIA, COMPATIBLE WITH HISTORY OF BARRETT'S ESOPHAGUS, WITH FOCAL LOW GRADE DYSPLASIA.  - NEGATIVE FOR HIGH-GRADE DYSPLASIA AND MALIGNANCY.   C. ESOPHAGUS, 36 CM; COLD BIOPSY:  - INVASIVE MODERATELY DIFFERENTIATED ADENOCARCINOMA, ARISING IN A BACKGROUND OF BARRETT'S ESOPHAGUS WITH HIGH-GRADE DYSPLASIA.   D. COLON, CECUM; COLD BIOPSY:  - CHRONIC COLITIS WITH MODERATE ACTIVITY (CRYPTITIS AND CRYPT ABSCESSES).  - NEGATIVE FOR GRANULOMA, DYSPLASIA, AND MALIGNANCY.   E. COLON, DESCENDING; COLD BIOPSY:  - CHRONIC COLITIS WITH MODERATE ACTIVITY (CRYPTITIS AND CRYPT ABSCESSES). - NEGATIVE FOR GRANULOMA, DYSPLASIA, AND MALIGNANCY.   F. RECTUM; COLD BIOPSY:  - CHRONIC PROCTITIS WITH MODERATE ACTIVITY (CRYPTITIS AND CRYPT ABSCESSES).  - NEGATIVE FOR GRANULOMA, DYSPLASIA, AND MALIGNANCY.   Comment:  Biopsy sections taken from the cecum, descending colon, and rectum display crypt architectural abnormalities (crypt branching and bifid crypts), basal lymphoplasmacytosis, and active intramucosal inflammation ranging from cryptitis to crypt abscesses.  The features are highly suggestive of an evolving inflammatory bowel disease.  Clinical and endoscopic correlation is recommended.    12/16/2021 Cancer Staging   Staging form: Esophagus - Adenocarcinoma, AJCC 8th Edition - Clinical stage from 12/16/2021: Stage I (cT1b, cN0, cM0, G2) - Signed by Truitt Merle, MD on 04/27/2022 Stage prefix: Initial diagnosis Total positive nodes: 0 Histologic grading system: 3 grade system Type of surgical resection with curative intent: Endoscopic mucosal resection   12/17/2021 Initial Diagnosis   Esophageal cancer ([place])   12/25/2021 Imaging   EXAM: CT CHEST WITH CONTRAST  IMPRESSION: 1. Moderate wall thickening involving the distal esophagus near the GE junction but no paraesophageal or upper abdominal lymphadenopathy. 2. No findings for pulmonary metastatic disease.  3. Age  advanced atherosclerotic calcifications involving the coronary arteries. 4. Advanced emphysematous changes with paraseptal bulla.   Aortic Atherosclerosis (ICD10-I70.0) and Emphysema (ICD10-J43.9).   01/04/2022 Procedure   Upper EUS, Dr. Mont Dutton  EGD Impressions: - Esophageal mucosal changes consistent with long-segment Barrett's esophagus. - Cratered esophageal ulcer at 40 cm (up to 8 mm in largest dimension). Biopsied. Suspect this is the site of previous biopsy proven adenocarcinoma. - Normal stomach. - Normal examined duodenum.   EUS Impressions: - There was no sign of significant pathology in the esophagus. No obvious esophageal mass was seen. Unable to T stage due to no mass lesion seen. - No periesophageal or perigastric lymph nodes visualized. - Normal visualized portions of the liver. - Normal celiac region.   01/04/2022 Pathology Results   DIAGNOSIS    A. Esophagus, 40 cm, esophageal ulcer, endoscopic biopsy:   Barrett esophagus with high-grade dysplasia. (See comment)   Cytomegalovirus esophagitis.    Comment: A p53 immunostain demonstrates loss of p53 expression within the atypical gastric mucosa, consistent with TP53 null mutant, supporting a diagnosis of high-grade dysplasia. CMV immunohistochemistry highlights rare positive cells, corroborated by viral cytopathic effect on H&E. PASD stain is negative for fungal organisms. Multiple step sections are examined.        01/28/2022 PET scan   FDG PET SBMT W NONDIAGNOSTIC CONCURRENT CT INITIAL   IMPRESSION:  1.  No evidence of FDG avid disease.   2.  Gluteal cleft findings favored to infectious/inflammatory, correlate with physical exam.    03/15/2022 Pathology Results   DIAGNOSIS    A. Esophagus, 36 cm, endomucosal resection:   Invasive, moderately differentiated adenocarcinoma. Tumor is seen throughout a thickened muscularis mucosal layer. Only a small amount of submucosa is present, best seen in block A2, and  here tumor is present within the submucosa. Cauterized tumor is present at the deep margin (orange ink) and a peripheral margin (blue ink). Negative for lymphovascular and perineural invasion. See summary in synoptic report.   B. Esophagus, 36 cm #2, endomucosal resection:   Invasive, moderately-differentiated adenocarcinoma. Tumor is seen throughout a thickened muscularis mucosal layer. A good representation of submucosal tissue is also present, with small areas of tumor seen in submucosal tissue and focally at the deep (orange ink) margin. Tumor is also present at a peripheral margin (blue ink). Negative for lymphovascular and perineural invasion. See summary in synoptic report.   C. Esophagus, 36 cm #3, endomucosal resection:   Invasive moderately differentiated adenocarcinoma. Tumor is focally seen in a thickened muscularis mucosal layer. A mucous gland is present but no submucosal tissue is present in this small specimen. Cauterized tumor is present at orange ink (the margin of this small specimen is entirely inked orange). Negative for lymphovascular and perineural invasion. See summary in synoptic report.   D. Esophagus, 39 cm, biopsy:   Gastric type mucosa. No squamous mucosa is seen. Negative for intestinal metaplasia.  Negative for dysplasia.   E. Esophagus, 37 cm, biopsy:    Focal area concerning for high grade dysplasia is seen in a background of squamocolumnar junctional mucosa with goblet cell intestinal metaplasia (Barrett's esophagus).  The focal area is best seen on level 1, deeper levels 3-5 do not demonstrate the concerning area. Negative for malignancy.   Comment:  See the synoptic report for summary.  While I very much suspect the T-stage is pT1b (submucosa), this is technically considered indefinite in the presence of positive deep margins. MSI testing has been requested on block  B2 of this case; results Bruce be issued separately.     05/24/2022 -   Chemotherapy   Patient is on Treatment Plan : ESOPHAGUS Carboplatin + Paclitaxel Weekly X 6 Weeks with XRT        INTERVAL HISTORY:  Aries Townley is here for a follow up of  Esophageal Cancer  He was last seen by me on 05/24/2022 He presents to the clinic alone, Pt is doing well states he has gain 3 to 4 lbs.Pt was asking for a prescription to calm nerves.Pt asked if he can move his radiation appointment time up.     All other systems were reviewed with the patient and are negative.  MEDICAL HISTORY:  Past Medical History:  Diagnosis Date   Abscess of right thigh 06/16/2015   Barrett esophagus    Bronchitis    Complication of anesthesia    hard to wake up after hernia surgery   Ear infection 12/13/2017   taking amoxicillin   Family history of adverse reaction to anesthesia    mom hard to wake up    GERD (gastroesophageal reflux disease)    HLD (hyperlipidemia)    Hydradenitis    Left leg   Hypertension    Intervertebral disc disorder    Myalgia    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/07/2015   Suppurative hidradenitis    Umbilical hernia without obstruction and without gangrene 11/22/2017    SURGICAL HISTORY: Past Surgical History:  Procedure Laterality Date   COLONOSCOPY WITH PROPOFOL N/A 03/06/2018   Procedure: COLONOSCOPY WITH PROPOFOL;  Surgeon: Lin Landsman, MD;  Location: [facility] ENDOSCOPY;  Service: Gastroenterology;  Laterality: N/A;   COLONOSCOPY WITH PROPOFOL N/A 12/16/2021   Procedure: COLONOSCOPY WITH PROPOFOL;  Surgeon: Robert Bellow, MD;  Location: [facility] ENDOSCOPY;  Service: Endoscopy;  Laterality: N/A;   ESOPHAGOGASTRODUODENOSCOPY (EGD) WITH PROPOFOL N/A 03/06/2018   Procedure: ESOPHAGOGASTRODUODENOSCOPY (EGD) WITH PROPOFOL;  Surgeon: Lin Landsman, MD;  Location: [facility] ENDOSCOPY;  Service: Gastroenterology;  Laterality: N/A;   ESOPHAGOGASTRODUODENOSCOPY (EGD) WITH PROPOFOL N/A 12/16/2021   Procedure: ESOPHAGOGASTRODUODENOSCOPY (EGD) WITH PROPOFOL;   Surgeon: Robert Bellow, MD;  Location: [facility] ENDOSCOPY;  Service: Endoscopy;  Laterality: N/A;   HERNIA REPAIR     INCISION AND DRAINAGE ABSCESS N/A 05/02/2020   Procedure: INCISION AND DRAINAGE ABSCESS;  Surgeon: Robert Bellow, MD;  Location: [facility] ORS;  Service: General;  Laterality: N/A;   INCISION AND DRAINAGE PERIRECTAL ABSCESS Right 06/16/2015   Procedure: IRRIGATION AND DEBRIDEMENT right inner thigh ABSCESS;  Surgeon: Florene Glen, MD;  Location: [facility] ORS;  Service: General;  Laterality: Right;   INSERTION OF MESH N/A 12/20/2017   Procedure: INSERTION OF MESH;  Surgeon: [name], MD;  Location: [facility] ORS;  Service: General;  Laterality: N/A;   KNEE ARTHROSCOPY WITH ANTERIOR CRUCIATE LIGAMENT (ACL) REPAIR Left 2002   KNEE SURGERY Left    PILONIDAL CYST EXCISION N/A 05/02/2020   Procedure: CYST EXCISION PILONIDAL EXTENSIVE;  Surgeon: Robert Bellow, MD;  Location: [facility] ORS;  Service: General;  Laterality: N/A;   UMBILICAL HERNIA REPAIR N/A 12/20/2017   Procedure: HERNIA REPAIR UMBILICAL ADULT;  Surgeon: [name], MD;  Location: [facility] ORS;  Service: General;  Laterality: N/A;   VIDEO ASSISTED THORACOSCOPY (VATS)/WEDGE RESECTION Right 04/29/2022   Procedure: RIGHT VIDEO ASSISTED THORACOSCOPY (VATS)/ STAPELING OF BLEBS;  Surgeon: [name], MD;  Location: [facility] OR;  Service: Thoracic;  Laterality: Right;    I have reviewed the social history and family history  with the patient and they are unchanged from previous note.  ALLERGIES:  has No Known Allergies.  MEDICATIONS:  Current Outpatient Medications  Medication Sig Dispense Refill   ALPRAZolam (XANAX) 0.25 MG tablet Take 1 tablet (0.25 mg total) by mouth 2 (two) times daily as needed for anxiety. 20 tablet 0   albuterol (VENTOLIN HFA) 108 (90 Base) MCG/ACT inhaler Inhale 2 puffs into the lungs every 6 (six) hours as needed for wheezing or shortness of breath. 8 g 2   amLODipine (NORVASC) 2.5 MG  tablet Take 1 tablet (2.5 mg total) by mouth daily. 90 tablet 1   doxycycline (VIBRA-TABS) 100 MG tablet Take 1 tablet (100 mg total) by mouth 2 (two) times daily. (Patient not taking: Reported on 05/24/2022) 14 tablet 0   ezetimibe (ZETIA) 10 MG tablet TAKE 1 TABLET BY MOUTH ONCE DAILY 90 tablet 3   ondansetron (ZOFRAN) 8 MG tablet Take 1 tablet (8 mg total) by mouth every 8 (eight) hours as needed for nausea or vomiting. Starting from day 3 30 tablet 2   ondansetron (ZOFRAN) 8 MG tablet Take 1 tablet (8 mg total) by mouth every 8 (eight) hours as needed for nausea or vomiting. Start on the third day after chemotherapy. 30 tablet 1   pantoprazole (PROTONIX) 40 MG tablet Take 1 tablet (40 mg total) by mouth daily. 30 tablet 0   predniSONE (DELTASONE) 10 MG tablet 6 tablets on Day 1 , then reduce by 1 tablet daily until gone (Patient not taking: Reported on 05/24/2022) 21 tablet 0   prochlorperazine (COMPAZINE) 10 MG tablet Take 1 tablet (10 mg total) by mouth every 6 (six) hours as needed. 30 tablet 2   prochlorperazine (COMPAZINE) 10 MG tablet Take 1 tablet (10 mg total) by mouth every 6 (six) hours as needed for nausea or vomiting. 30 tablet 1   No current facility-administered medications for this visit.    PHYSICAL EXAMINATION: ECOG PERFORMANCE STATUS: 0 - Asymptomatic  Vitals:   05/31/22 0840  BP: (!) 129/90  Pulse: 85  Resp: 17  Temp: 98 F (36.7 C)  SpO2: 100%   Wt Readings from Last 3 Encounters:  05/31/22 236 lb 1.6 oz (107.1 kg)  05/24/22 228 lb 4 oz (103.5 kg)  05/18/22 231 lb (104.8 kg)     GENERAL:alert, no distress and comfortable SKIN: skin color normal, no rashes or significant lesions EYES: normal, Conjunctiva are pink and non-injected, sclera clear  NEURO: alert & oriented x 3 with fluent speech  LABORATORY DATA:  I have reviewed the data as listed    Latest Ref Rng & Units 05/31/2022    7:59 AM 05/24/2022    9:13 AM 05/13/2022   11:48 AM  CBC  WBC 4.0 -  10.5 K/uL 7.7  11.4  10.4   Hemoglobin 13.0 - 17.0 g/dL 13.4  13.6  12.8   Hematocrit 39.0 - 52.0 % 40.9  42.5  38.5   Platelets 150 - 400 K/uL 325  427  720.0         Latest Ref Rng & Units 05/31/2022    7:59 AM 05/24/2022    9:13 AM 05/13/2022   11:48 AM  CMP  Glucose 70 - 99 mg/dL 88  108  103   BUN 6 - 20 mg/dL _0 Creatinine 0.61 - 1.24 mg/dL 0.78  0.95  0.97   Sodium 135 - 145 mmol/L 139  137  136   Potassium 3.5 -  5.1 mmol/L 3.7  3.7  4.2   Chloride 98 - 111 mmol/L 105  104  103   CO2 22 - 32 mmol/L _0 Calcium 8.9 - 10.3 mg/dL 9.5  9.5  8.9   Total Protein 6.5 - 8.1 g/dL 7.9  8.1    Total Bilirubin 0.3 - 1.2 mg/dL 0.5  0.3    Alkaline Phos 38 - 126 U/L 66  77    AST 15 - 41 U/L 14  14    ALT 0 - 44 U/L 12  11        RADIOGRAPHIC STUDIES: I have personally reviewed the radiological images as listed and agreed with the findings in the report. No results found.    No orders of the defined types were placed in this encounter.  All questions were answered. The patient knows to call the clinic with any problems, questions or concerns. No barriers to learning was detected. The total time spent in the appointment was 30 minutes.     Truitt Merle, MD 05/31/2022   Felicity Coyer, CMA, am acting as scribe for Truitt Merle, MD.   I have reviewed the above documentation for accuracy and completeness, and I agree with the above.

## 2022-05-30 NOTE — Assessment & Plan Note (Signed)
-  Adenocarcinoma. cT1N0M0, stage MMR normal, HER2(+), PD-L1 2%  -Screening EGD showed Screening EGD on 12/16/21 showed only reflux esophagitis, no bleeding. Pathology revealed invasive moderately differentiated adenocarcinoma at 36 cm  --staging PET scan 01/28/22 showed no FDG-avid disease. -Pt declined surgery, and wants to try chemoRT -Pt started concurrent chemotherapy and radiation 05/24/2022.

## 2022-05-31 ENCOUNTER — Inpatient Hospital Stay (HOSPITAL_BASED_OUTPATIENT_CLINIC_OR_DEPARTMENT_OTHER): Payer: BC Managed Care – PPO | Admitting: Hematology

## 2022-05-31 ENCOUNTER — Other Ambulatory Visit: Payer: BC Managed Care – PPO

## 2022-05-31 ENCOUNTER — Ambulatory Visit: Payer: BC Managed Care – PPO

## 2022-05-31 ENCOUNTER — Inpatient Hospital Stay: Payer: BC Managed Care – PPO

## 2022-05-31 ENCOUNTER — Ambulatory Visit
Admission: RE | Admit: 2022-05-31 | Discharge: 2022-05-31 | Disposition: A | Discharge status: Home / Self Care | Payer: BC Managed Care – PPO | Source: Ambulatory Visit | Attending: Radiation Oncology | Admitting: Radiation Oncology

## 2022-05-31 ENCOUNTER — Encounter: Payer: Self-pay | Admitting: Hematology

## 2022-05-31 ENCOUNTER — Inpatient Hospital Stay: Payer: BC Managed Care – PPO | Admitting: Nutrition

## 2022-05-31 ENCOUNTER — Other Ambulatory Visit: Payer: Self-pay

## 2022-05-31 VITALS — BP 129/90 | HR 85 | Temp 98.0°F | Resp 17 | Ht 70.0 in | Wt 236.1 lb

## 2022-05-31 DIAGNOSIS — R066 Hiccough: Secondary | ICD-10-CM | POA: Insufficient documentation

## 2022-05-31 DIAGNOSIS — Z5111 Encounter for antineoplastic chemotherapy: Secondary | ICD-10-CM | POA: Insufficient documentation

## 2022-05-31 DIAGNOSIS — Z510 Encounter for antineoplastic radiation therapy: Secondary | ICD-10-CM | POA: Diagnosis not present

## 2022-05-31 DIAGNOSIS — C155 Malignant neoplasm of lower third of esophagus: Secondary | ICD-10-CM | POA: Diagnosis not present

## 2022-05-31 DIAGNOSIS — I10 Essential (primary) hypertension: Secondary | ICD-10-CM | POA: Insufficient documentation

## 2022-05-31 DIAGNOSIS — F419 Anxiety disorder, unspecified: Secondary | ICD-10-CM | POA: Insufficient documentation

## 2022-05-31 DIAGNOSIS — C159 Malignant neoplasm of esophagus, unspecified: Secondary | ICD-10-CM | POA: Insufficient documentation

## 2022-05-31 LAB — RAD ONC ARIA SESSION SUMMARY
Course Elapsed Days: 7
Plan Fractions Treated to Date: 6
Plan Prescribed Dose Per Fraction: 1.8 Gy
Plan Total Fractions Prescribed: 25
Plan Total Prescribed Dose: 45 Gy
Reference Point Dosage Given to Date: 10.8 Gy
Reference Point Session Dosage Given: 1.8 Gy
Session Number: 6

## 2022-05-31 LAB — CMP (CANCER CENTER ONLY)
ALT: 12 U/L (ref 0–44)
AST: 14 U/L — ABNORMAL LOW (ref 15–41)
Albumin: 4.1 g/dL (ref 3.5–5.0)
Alkaline Phosphatase: 66 U/L (ref 38–126)
Anion gap: 7 (ref 5–15)
BUN: 8 mg/dL (ref 6–20)
CO2: 27 mmol/L (ref 22–32)
Calcium: 9.5 mg/dL (ref 8.9–10.3)
Chloride: 105 mmol/L (ref 98–111)
Creatinine: 0.78 mg/dL (ref 0.61–1.24)
GFR, Estimated: >60 mL/min (ref >60)
Glucose, Bld: 88 mg/dL (ref 70–99)
Potassium: 3.7 mmol/L (ref 3.5–5.1)
Sodium: 139 mmol/L (ref 135–145)
Total Bilirubin: 0.5 mg/dL (ref 0.3–1.2)
Total Protein: 7.9 g/dL (ref 6.5–8.1)

## 2022-05-31 LAB — CBC WITH DIFFERENTIAL (CANCER CENTER ONLY)
Abs Immature Granulocytes: 0.07 10*3/uL (ref 0.00–0.07)
Basophils Absolute: 0 10*3/uL (ref 0.0–0.1)
Basophils Relative: 1 %
Eosinophils Absolute: 0.2 10*3/uL (ref 0.0–0.5)
Eosinophils Relative: 2 %
HCT: 40.9 % (ref 39.0–52.0)
Hemoglobin: 13.4 g/dL (ref 13.0–17.0)
Immature Granulocytes: 1 %
Lymphocytes Relative: 24 %
Lymphs Abs: 1.8 10*3/uL (ref 0.7–4.0)
MCH: 27.3 pg (ref 26.0–34.0)
MCHC: 32.8 g/dL (ref 30.0–36.0)
MCV: 83.5 fL (ref 80.0–100.0)
Monocytes Absolute: 0.8 10*3/uL (ref 0.1–1.0)
Monocytes Relative: 10 %
Neutro Abs: 4.8 10*3/uL (ref 1.7–7.7)
Neutrophils Relative %: 62 %
Platelet Count: 325 10*3/uL (ref 150–400)
RBC: 4.9 MIL/uL (ref 4.22–5.81)
RDW: 16.3 % — ABNORMAL HIGH (ref 11.5–15.5)
WBC Count: 7.7 10*3/uL (ref 4.0–10.5)
nRBC: 0 % (ref 0.0–0.2)

## 2022-05-31 MED ORDER — FAMOTIDINE IN NACL 20-0.9 MG/50ML-% IV SOLN
20.0000 mg | Freq: Once | INTRAVENOUS | Status: AC
  Administered 2022-05-31: 20 mg via INTRAVENOUS
  Filled 2022-05-31: qty 50

## 2022-05-31 MED ORDER — SODIUM CHLORIDE 0.9 % IV SOLN
300.0000 mg | Freq: Once | INTRAVENOUS | Status: AC
  Administered 2022-05-31: 300 mg via INTRAVENOUS
  Filled 2022-05-31: qty 30

## 2022-05-31 MED ORDER — SODIUM CHLORIDE 0.9 % IV SOLN
10.0000 mg | Freq: Once | INTRAVENOUS | Status: AC
  Administered 2022-05-31: 10 mg via INTRAVENOUS
  Filled 2022-05-31: qty 10

## 2022-05-31 MED ORDER — PALONOSETRON HCL INJECTION 0.25 MG/5ML
0.2500 mg | Freq: Once | INTRAVENOUS | Status: AC
  Administered 2022-05-31: 0.25 mg via INTRAVENOUS
  Filled 2022-05-31: qty 5

## 2022-05-31 MED ORDER — SODIUM CHLORIDE 0.9 % IV SOLN
50.0000 mg/m2 | Freq: Once | INTRAVENOUS | Status: AC
  Administered 2022-05-31: 114 mg via INTRAVENOUS
  Filled 2022-05-31: qty 19

## 2022-05-31 MED ORDER — SODIUM CHLORIDE 0.9 % IV SOLN: Freq: Once | INTRAVENOUS | Status: AC

## 2022-05-31 MED ORDER — DIPHENHYDRAMINE HCL 50 MG/ML IJ SOLN
50.0000 mg | Freq: Once | INTRAMUSCULAR | Status: AC
  Administered 2022-05-31: 50 mg via INTRAVENOUS
  Filled 2022-05-31: qty 1

## 2022-05-31 MED ORDER — ALPRAZOLAM 0.25 MG PO TABS: 0.2500 mg | ORAL_TABLET | Freq: Two times a day (BID) | ORAL | 0 refills | Status: DC | PRN

## 2022-05-31 NOTE — Patient Instructions (Signed)
[facility]  Discharge Instructions: Thank you for choosing [facility] to provide your oncology and hematology care.   If you have a lab appointment with the [facility], please go directly to the [facility] and check in at the registration area.   Wear comfortable clothing and clothing appropriate for easy access to any Portacath or PICC line.   We strive to give you quality time with your provider. You may need to reschedule your appointment if you arrive late (15 or more minutes).  Arriving late affects you and other patients whose appointments are after yours.  Also, if you miss three or more appointments without notifying the office, you may be dismissed from the clinic at the provider's discretion.      For prescription refill requests, have your pharmacy contact our office and allow 72 hours for refills to be completed.    Today you received the following chemotherapy and/or immunotherapy agents: Paclitaxel (Taxol) & Carboplatin     To help prevent nausea and vomiting after your treatment, we encourage you to take your nausea medication as directed.  BELOW ARE SYMPTOMS THAT SHOULD BE REPORTED IMMEDIATELY: *FEVER GREATER THAN 100.4 F (38 C) OR HIGHER *CHILLS OR SWEATING *NAUSEA AND VOMITING THAT IS NOT CONTROLLED WITH YOUR NAUSEA MEDICATION *UNUSUAL SHORTNESS OF BREATH *UNUSUAL BRUISING OR BLEEDING *URINARY PROBLEMS (pain or burning when urinating, or frequent urination) *BOWEL PROBLEMS (unusual diarrhea, constipation, pain near the anus) TENDERNESS IN MOUTH AND THROAT WITH OR WITHOUT PRESENCE OF ULCERS (sore throat, sores in mouth, or a toothache) UNUSUAL RASH, SWELLING OR PAIN  UNUSUAL VAGINAL DISCHARGE OR ITCHING   Items with * indicate a potential emergency and should be followed up as soon as possible or go to the Emergency Department if any problems should occur.  Please show the CHEMOTHERAPY ALERT CARD or IMMUNOTHERAPY  ALERT CARD at check-in to the Emergency Department and triage nurse.  Should you have questions after your visit or need to cancel or reschedule your appointment, please contact [facility]  Dept: [phone]  and follow the prompts.  Office hours are 8:00 a.m. to 4:30 p.m. Monday - Friday. Please note that voicemails left after 4:00 p.m. may not be returned until the following business day.  We are closed weekends and major holidays. You have access to a nurse at all times for urgent questions. Please call the main number to the clinic Dept: [phone] and follow the prompts.   For any non-urgent questions, you may also contact your provider using MyChart. We now offer e-Visits for anyone 105 and older to request care online for non-urgent symptoms. For details visit [email or web address].   Also download the MyChart app! Go to the app store, search "MyChart", open the app, select [facility], and log in with your MyChart username and password.  Masks are optional in the cancer centers. If you would like for your care team to wear a mask while they are taking care of you, please let them know. You may have one support person who is at least 52 years old accompany you for your appointments.

## 2022-05-31 NOTE — Progress Notes (Signed)
51 year old man diagnosed with esophageal cancer followed by Dr. Burr Medico.   Patient is receiving concurrent chemoradiation therapy with Taxol/carbo.   Final radiation therapy scheduled for Friday, January 5.  Past medical history includes Barrett's esophagitis, GERD, hyperlipidemia, hypertension.  Medications include Zofran, Protonix, prednisone, Compazine, and Xanax.  Labs were reviewed.  Height: 5 feet 10 inches. Weight: 236 pounds 1.6 ounces on December 4. Usual body weight: 228-230 pounds per patient. BMI: 33.88.  Patient reports currently he is eating well and is not having any difficulties chewing or swallowing.  He denies taste changes and dry mouth at this time.  He generally eats most foods.  He has a question about sugar and cancer.  Reports he tried [employer] and liked it.  Nutrition diagnosis:  Predicted sub-optimal energy intake related to esophageal cancer and associated treatments as evidenced by a condition for which research shows less than optimal oral intake.  Intervention: Educated patient on the importance of weight maintenance. Encourage small frequent meals with high-calorie, high-protein foods. Provided strategies for eating softer foods and adding gravies and sauces when needed. Explained the role of oral nutrition supplements and provided samples and coupons. Brief explanation regarding sugar and cancer.  Nutrition handout provided.  Monitoring, evaluation, goals: Patient will tolerate adequate calories and protein for weight maintenance.  Next visit: Monday, December 11 during infusion.  **Disclaimer: This note was dictated with voice recognition software. Similar sounding words can inadvertently be transcribed and this note may contain transcription errors which may not have been corrected upon publication of note.**

## 2022-06-01 ENCOUNTER — Ambulatory Visit
Admission: RE | Admit: 2022-06-01 | Discharge: 2022-06-01 | Disposition: A | Discharge status: Home / Self Care | Payer: BC Managed Care – PPO | Source: Ambulatory Visit | Attending: Radiation Oncology | Admitting: Radiation Oncology

## 2022-06-01 ENCOUNTER — Other Ambulatory Visit: Payer: Self-pay

## 2022-06-01 DIAGNOSIS — C155 Malignant neoplasm of lower third of esophagus: Secondary | ICD-10-CM | POA: Diagnosis not present

## 2022-06-01 DIAGNOSIS — Z510 Encounter for antineoplastic radiation therapy: Secondary | ICD-10-CM | POA: Diagnosis not present

## 2022-06-01 LAB — RAD ONC ARIA SESSION SUMMARY
Course Elapsed Days: 8
Plan Fractions Treated to Date: 7
Plan Prescribed Dose Per Fraction: 1.8 Gy
Plan Total Fractions Prescribed: 25
Plan Total Prescribed Dose: 45 Gy
Reference Point Dosage Given to Date: 12.6 Gy
Reference Point Session Dosage Given: 1.8 Gy
Session Number: 7

## 2022-06-01 MED ORDER — BACLOFEN 10 MG PO TABS: 10.0000 mg | ORAL_TABLET | Freq: Two times a day (BID) | ORAL | 0 refills | Status: DC

## 2022-06-01 NOTE — Progress Notes (Signed)
Verbal order given by Dr. Burr Medico for baclofen 35m PO BID for hiccups.  Prescription sent to pt's preferred pharmacy.

## 2022-06-02 ENCOUNTER — Ambulatory Visit
Admission: RE | Admit: 2022-06-02 | Discharge: 2022-06-02 | Disposition: A | Discharge status: Home / Self Care | Payer: BC Managed Care – PPO | Source: Ambulatory Visit | Attending: Radiation Oncology | Admitting: Radiation Oncology

## 2022-06-02 ENCOUNTER — Other Ambulatory Visit: Payer: Self-pay

## 2022-06-02 ENCOUNTER — Inpatient Hospital Stay: Payer: BC Managed Care – PPO | Admitting: Pulmonary Disease

## 2022-06-02 DIAGNOSIS — C155 Malignant neoplasm of lower third of esophagus: Secondary | ICD-10-CM | POA: Diagnosis not present

## 2022-06-02 DIAGNOSIS — Z510 Encounter for antineoplastic radiation therapy: Secondary | ICD-10-CM | POA: Diagnosis not present

## 2022-06-02 LAB — RAD ONC ARIA SESSION SUMMARY
Course Elapsed Days: 9
Plan Fractions Treated to Date: 8
Plan Prescribed Dose Per Fraction: 1.8 Gy
Plan Total Fractions Prescribed: 25
Plan Total Prescribed Dose: 45 Gy
Reference Point Dosage Given to Date: 14.4 Gy
Reference Point Session Dosage Given: 1.8 Gy
Session Number: 8

## 2022-06-03 ENCOUNTER — Other Ambulatory Visit: Payer: Self-pay

## 2022-06-03 ENCOUNTER — Ambulatory Visit
Admission: RE | Admit: 2022-06-03 | Discharge: 2022-06-03 | Disposition: A | Discharge status: Home / Self Care | Payer: BC Managed Care – PPO | Source: Ambulatory Visit | Attending: Radiation Oncology | Admitting: Radiation Oncology

## 2022-06-03 DIAGNOSIS — Z510 Encounter for antineoplastic radiation therapy: Secondary | ICD-10-CM | POA: Diagnosis not present

## 2022-06-03 DIAGNOSIS — C155 Malignant neoplasm of lower third of esophagus: Secondary | ICD-10-CM | POA: Diagnosis not present

## 2022-06-03 LAB — RAD ONC ARIA SESSION SUMMARY
Course Elapsed Days: 10
Plan Fractions Treated to Date: 9
Plan Prescribed Dose Per Fraction: 1.8 Gy
Plan Total Fractions Prescribed: 25
Plan Total Prescribed Dose: 45 Gy
Reference Point Dosage Given to Date: 16.2 Gy
Reference Point Session Dosage Given: 1.8 Gy
Session Number: 9

## 2022-06-04 ENCOUNTER — Other Ambulatory Visit: Payer: Self-pay

## 2022-06-04 ENCOUNTER — Ambulatory Visit
Admission: RE | Admit: 2022-06-04 | Discharge: 2022-06-04 | Disposition: A | Discharge status: Home / Self Care | Payer: BC Managed Care – PPO | Source: Ambulatory Visit | Attending: Radiation Oncology | Admitting: Radiation Oncology

## 2022-06-04 DIAGNOSIS — Z510 Encounter for antineoplastic radiation therapy: Secondary | ICD-10-CM | POA: Diagnosis not present

## 2022-06-04 DIAGNOSIS — C155 Malignant neoplasm of lower third of esophagus: Secondary | ICD-10-CM | POA: Diagnosis not present

## 2022-06-04 LAB — RAD ONC ARIA SESSION SUMMARY
Course Elapsed Days: 11
Plan Fractions Treated to Date: 10
Plan Prescribed Dose Per Fraction: 1.8 Gy
Plan Total Fractions Prescribed: 25
Plan Total Prescribed Dose: 45 Gy
Reference Point Dosage Given to Date: 18 Gy
Reference Point Session Dosage Given: 1.8 Gy
Session Number: 10

## 2022-06-04 MED FILL — Dexamethasone Sodium Phosphate Inj 100 MG/10ML: INTRAMUSCULAR | Qty: 1 | Status: AC

## 2022-06-04 NOTE — Progress Notes (Unsigned)
[facility] OFFICE PROGRESS NOTE  Crecencio Mc, MD [address] [place] [address]  DIAGNOSIS:  f/u of Esophageal Cancer    Oncology History  Esophageal cancer ([place])  12/16/2021 Procedure   Upper GI endoscopy, Dr. Bary Castilla  Impression: - LA Grade C reflux esophagitis with no bleeding. Rule out Barrett's esophagus. Biopsied. - Small hiatal hernia. - Normal examined duodenum.   12/16/2021 Initial Biopsy   DIAGNOSIS:  A. ESOPHAGUS, 40 CM; COLD BIOPSY:  - SQUAMOCOLUMNAR MUCOSA WITH INTESTINAL METAPLASIA, COMPATIBLE WITH HISTORY OF BARRETT'S ESOPHAGUS, WITH FOCAL LOW GRADE DYSPLASIA.  - NEGATIVE FOR HIGH-GRADE DYSPLASIA AND MALIGNANCY.   B. ESOPHAGUS, 38 CM; BIOPSY:  - SQUAMOCOLUMNAR MUCOSA WITH INTESTINAL METAPLASIA, COMPATIBLE WITH HISTORY OF BARRETT'S ESOPHAGUS, WITH FOCAL LOW GRADE DYSPLASIA.  - NEGATIVE FOR HIGH-GRADE DYSPLASIA AND MALIGNANCY.   C. ESOPHAGUS, 36 CM; COLD BIOPSY:  - INVASIVE MODERATELY DIFFERENTIATED ADENOCARCINOMA, ARISING IN A BACKGROUND OF BARRETT'S ESOPHAGUS WITH HIGH-GRADE DYSPLASIA.   D. COLON, CECUM; COLD BIOPSY:  - CHRONIC COLITIS WITH MODERATE ACTIVITY (CRYPTITIS AND CRYPT ABSCESSES).  - NEGATIVE FOR GRANULOMA, DYSPLASIA, AND MALIGNANCY.   E. COLON, DESCENDING; COLD BIOPSY:  - CHRONIC COLITIS WITH MODERATE ACTIVITY (CRYPTITIS AND CRYPT ABSCESSES). - NEGATIVE FOR GRANULOMA, DYSPLASIA, AND MALIGNANCY.   F. RECTUM; COLD BIOPSY:  - CHRONIC PROCTITIS WITH MODERATE ACTIVITY (CRYPTITIS AND CRYPT ABSCESSES).  - NEGATIVE FOR GRANULOMA, DYSPLASIA, AND MALIGNANCY.   Comment:  Biopsy sections taken from the cecum, descending colon, and rectum display crypt architectural abnormalities (crypt branching and bifid crypts), basal lymphoplasmacytosis, and active intramucosal inflammation ranging from cryptitis to crypt abscesses.  The features are highly suggestive of an evolving inflammatory bowel disease.  Clinical and endoscopic  correlation is recommended.    12/16/2021 Cancer Staging   Staging form: Esophagus - Adenocarcinoma, AJCC 8th Edition - Clinical stage from 12/16/2021: Stage I (cT1b, cN0, cM0, G2) - Signed by Truitt Merle, MD on 04/27/2022 Stage prefix: Initial diagnosis Total positive nodes: 0 Histologic grading system: 3 grade system Type of surgical resection with curative intent: Endoscopic mucosal resection   12/17/2021 Initial Diagnosis   Esophageal cancer ([place])   12/25/2021 Imaging   EXAM: CT CHEST WITH CONTRAST  IMPRESSION: 1. Moderate wall thickening involving the distal esophagus near the GE junction but no paraesophageal or upper abdominal lymphadenopathy. 2. No findings for pulmonary metastatic disease. 3. Age advanced atherosclerotic calcifications involving the coronary arteries. 4. Advanced emphysematous changes with paraseptal bulla.   Aortic Atherosclerosis (ICD10-I70.0) and Emphysema (ICD10-J43.9).   01/04/2022 Procedure   Upper EUS, Dr. Mont Dutton  EGD Impressions: - Esophageal mucosal changes consistent with long-segment Barrett's esophagus. - Cratered esophageal ulcer at 40 cm (up to 8 mm in largest dimension). Biopsied. Suspect this is the site of previous biopsy proven adenocarcinoma. - Normal stomach. - Normal examined duodenum.   EUS Impressions: - There was no sign of significant pathology in the esophagus. No obvious esophageal mass was seen. Unable to T stage due to no mass lesion seen. - No periesophageal or perigastric lymph nodes visualized. - Normal visualized portions of the liver. - Normal celiac region.   01/04/2022 Pathology Results   DIAGNOSIS    A. Esophagus, 40 cm, esophageal ulcer, endoscopic biopsy:   Barrett esophagus with high-grade dysplasia. (See comment)   Cytomegalovirus esophagitis.    Comment: A p53 immunostain demonstrates loss of p53 expression within the atypical gastric mucosa, consistent with TP53 null mutant, supporting a diagnosis of  high-grade dysplasia. CMV immunohistochemistry highlights rare  positive cells, corroborated by viral cytopathic effect on H&E. PASD stain is negative for fungal organisms. Multiple step sections are examined.        01/28/2022 PET scan   FDG PET SBMT W NONDIAGNOSTIC CONCURRENT CT INITIAL   IMPRESSION:  1.  No evidence of FDG avid disease.   2.  Gluteal cleft findings favored to infectious/inflammatory, correlate with physical exam.    03/15/2022 Pathology Results   DIAGNOSIS    A. Esophagus, 36 cm, endomucosal resection:   Invasive, moderately differentiated adenocarcinoma. Tumor is seen throughout a thickened muscularis mucosal layer. Only a small amount of submucosa is present, best seen in block A2, and here tumor is present within the submucosa. Cauterized tumor is present at the deep margin (orange ink) and a peripheral margin (blue ink). Negative for lymphovascular and perineural invasion. See summary in synoptic report.   B. Esophagus, 36 cm #2, endomucosal resection:   Invasive, moderately-differentiated adenocarcinoma. Tumor is seen throughout a thickened muscularis mucosal layer. A good representation of submucosal tissue is also present, with small areas of tumor seen in submucosal tissue and focally at the deep (orange ink) margin. Tumor is also present at a peripheral margin (blue ink). Negative for lymphovascular and perineural invasion. See summary in synoptic report.   C. Esophagus, 36 cm #3, endomucosal resection:   Invasive moderately differentiated adenocarcinoma. Tumor is focally seen in a thickened muscularis mucosal layer. A mucous gland is present but no submucosal tissue is present in this small specimen. Cauterized tumor is present at orange ink (the margin of this small specimen is entirely inked orange). Negative for lymphovascular and perineural invasion. See summary in synoptic report.   D. Esophagus, 39 cm, biopsy:   Gastric type mucosa. No  squamous mucosa is seen. Negative for intestinal metaplasia.  Negative for dysplasia.   E. Esophagus, 37 cm, biopsy:    Focal area concerning for high grade dysplasia is seen in a background of squamocolumnar junctional mucosa with goblet cell intestinal metaplasia (Barrett's esophagus).  The focal area is best seen on level 1, deeper levels 3-5 do not demonstrate the concerning area. Negative for malignancy.   Comment:  See the synoptic report for summary.  While I very much suspect the T-stage is pT1b (submucosa), this is technically considered indefinite in the presence of positive deep margins. MSI testing has been requested on block B2 of this case; results will be issued separately.     05/24/2022 -  Chemotherapy   Patient is on Treatment Plan : ESOPHAGUS Carboplatin + Paclitaxel Weekly X 6 Weeks with XRT       CURRENT THERAPY:  Concurrent chemoradiation with carboplatin and paclitaxel weekly.  First dose on 05/24/2022. Status post 2 weeks of treatment.   INTERVAL HISTORY: Bruce Graham 51 y.o. male returns to the clinic today for a follow-up visit.  The patient is currently undergoing weekly concurrent chemoradiation he is status post 2 weeks of chemotherapy and he is tolerating it well without any concerning adverse side effects.  He overall feels well today though reports prolonged episodes of hiccups on Tuesdays that last approximately 7 hours and is no relieved with Baclofen.  He also endorses gas not relieved with gas x. Denies any fever, chills, night sweats, unexplained weight loss, decreased appetite, and odynophagia. He reports mild discomfort  in the epigastric region shortly after swallowing food independent of the type of food he is eating. He denies any shortness of breath, cough, chest pain, or hemoptysis.  Denies any  nausea, vomiting, diarrhea, or constipation. Denies any numbness and tingling in the hands and feet.  He is currently taking Xanax twice daily as needed for  anxiety.   His last day radiation is scheduled for 07/02/22.  He is here today for evaluation and repeat blood work before undergoing week 3 of chemotherapy.  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  MEDICATIONS:  Current Outpatient Medications  Medication Sig Dispense Refill   baclofen (LIORESAL) 10 MG tablet Take 1 tablet (10 mg total) by mouth 2 (two) times daily. 30 each 0   albuterol (VENTOLIN HFA) 108 (90 Base) MCG/ACT inhaler Inhale 2 puffs into the lungs every 6 (six) hours as needed for wheezing or shortness of breath. 8 g 2   ALPRAZolam (XANAX) 0.25 MG tablet Take 1 tablet (0.25 mg total) by mouth 2 (two) times daily as needed for anxiety. 20 tablet 0   amLODipine (NORVASC) 2.5 MG tablet Take 1 tablet (2.5 mg total) by mouth daily. 90 tablet 1   doxycycline (VIBRA-TABS) 100 MG tablet Take 1 tablet (100 mg total) by mouth 2 (two) times daily. (Patient not taking: Reported on 05/24/2022) 14 tablet 0   ezetimibe (ZETIA) 10 MG tablet TAKE 1 TABLET BY MOUTH ONCE DAILY 90 tablet 3   ondansetron (ZOFRAN) 8 MG tablet Take 1 tablet (8 mg total) by mouth every 8 (eight) hours as needed for nausea or vomiting. Starting from day 3 30 tablet 2   ondansetron (ZOFRAN) 8 MG tablet Take 1 tablet (8 mg total) by mouth every 8 (eight) hours as needed for nausea or vomiting. Start on the third  day after chemotherapy. 30 tablet 1   pantoprazole (PROTONIX) 40 MG tablet Take 1 tablet (40 mg total) by mouth daily. 30 tablet 0   predniSONE (DELTASONE) 10 MG tablet 6 tablets on Day 1 , then reduce by 1 tablet daily until gone (Patient not taking: Reported on 05/24/2022) 21 tablet 0   prochlorperazine (COMPAZINE) 10 MG tablet Take 1 tablet (10 mg total) by mouth every 6 (six) hours as needed. 30 tablet 2   prochlorperazine (COMPAZINE) 10 MG tablet Take 1 tablet (10 mg total) by mouth every 6 (six) hours as needed for nausea or vomiting. 30 tablet 1   No current facility-administered medications for this visit.    SURGICAL HISTORY:  Past Surgical History:  Procedure Laterality Date   COLONOSCOPY WITH PROPOFOL N/A 03/06/2018   Procedure: COLONOSCOPY WITH PROPOFOL;  Surgeon: Lin Landsman, MD;  Location: [facility] ENDOSCOPY;  Service: Gastroenterology;  Laterality: N/A;   COLONOSCOPY WITH PROPOFOL N/A 12/16/2021   Procedure: COLONOSCOPY WITH PROPOFOL;  Surgeon: Robert Bellow, MD;  Location: [facility] ENDOSCOPY;  Service: Endoscopy;  Laterality: N/A;   ESOPHAGOGASTRODUODENOSCOPY (EGD) WITH PROPOFOL N/A 03/06/2018   Procedure: ESOPHAGOGASTRODUODENOSCOPY (  EGD) WITH PROPOFOL;  Surgeon: Lin Landsman, MD;  Location: [facility] ENDOSCOPY;  Service: Gastroenterology;  Laterality: N/A;   ESOPHAGOGASTRODUODENOSCOPY (EGD) WITH PROPOFOL N/A 12/16/2021   Procedure: ESOPHAGOGASTRODUODENOSCOPY (EGD) WITH PROPOFOL;  Surgeon: Robert Bellow, MD;  Location: [facility] ENDOSCOPY;  Service: Endoscopy;  Laterality: N/A;   HERNIA REPAIR     INCISION AND DRAINAGE ABSCESS N/A 05/02/2020   Procedure: INCISION AND DRAINAGE ABSCESS;  Surgeon: Robert Bellow, MD;  Location: [facility] ORS;  Service: General;  Laterality: N/A;   INCISION AND DRAINAGE PERIRECTAL ABSCESS Right 06/16/2015   Procedure: IRRIGATION AND DEBRIDEMENT right inner thigh ABSCESS;  Surgeon: Florene Glen, MD;  Location: [facility] ORS;  Service: General;   Laterality: Right;   INSERTION OF MESH N/A 12/20/2017   Procedure: INSERTION OF MESH;  Surgeon: [name], MD;  Location: [facility] ORS;  Service: General;  Laterality: N/A;   KNEE ARTHROSCOPY WITH ANTERIOR CRUCIATE LIGAMENT (ACL) REPAIR Left 2002   KNEE SURGERY Left    PILONIDAL CYST EXCISION N/A 05/02/2020   Procedure: CYST EXCISION PILONIDAL EXTENSIVE;  Surgeon: Robert Bellow, MD;  Location: [facility] ORS;  Service: General;  Laterality: N/A;   UMBILICAL HERNIA REPAIR N/A 12/20/2017   Procedure: HERNIA REPAIR UMBILICAL ADULT;  Surgeon: [name], MD;  Location: [facility] ORS;  Service: General;  Laterality: N/A;   VIDEO ASSISTED THORACOSCOPY (VATS)/WEDGE RESECTION Right 04/29/2022   Procedure: RIGHT VIDEO ASSISTED THORACOSCOPY (VATS)/ STAPELING OF BLEBS;  Surgeon: [name], MD;  Location: [place];  Service: Thoracic;  Laterality: Right;    REVIEW OF SYSTEMS:   Review of Systems  Constitutional: Negative for appetite change, chills, fatigue, fever and unexpected weight change.  HENT: Positive for mild odynophagia. Negative for mouth sores, nosebleeds, sore throat and trouble swallowing.   Eyes: Negative for eye problems and icterus.  Respiratory: Negative for cough, hemoptysis, shortness of breath and wheezing.   Cardiovascular: Negative for chest pain and leg swelling.  Gastrointestinal: Positive for hiccups after treatments. Negative for abdominal pain, constipation, diarrhea, nausea and vomiting.  Genitourinary: Negative for bladder incontinence, difficulty urinating, dysuria, frequency and hematuria.   Musculoskeletal: Negative for back pain, gait problem, neck pain and neck stiffness.  Skin: Negative for itching and rash.  Neurological: Negative for dizziness, extremity weakness, gait problem, headaches, light-headedness and seizures.  Hematological: Negative for adenopathy. Does not bruise/bleed easily.  Psychiatric/Behavioral: Negative for confusion, depression and  sleep disturbance. The patient is not nervous/anxious.     PHYSICAL EXAMINATION:  There were no vitals taken for this visit.  ECOG PERFORMANCE STATUS: 1  Physical Exam  Constitutional: Oriented to person, place, and time and well-developed, well-nourished, and in no distress.  HENT:  Head: Normocephalic and atraumatic.  Mouth/Throat: Oropharynx is clear and moist. No oropharyngeal exudate.  Eyes: Conjunctivae are normal. Right eye exhibits no discharge. Left eye exhibits no discharge. No scleral icterus.  Neck: Normal range of motion. Neck supple.  Cardiovascular: Normal rate, regular rhythm, normal heart sounds and intact distal pulses.   Pulmonary/Chest: Effort normal and breath sounds normal. No respiratory distress. No wheezes. No rales.  Abdominal: Soft. Bowel sounds are normal. Exhibits no distension and no mass. There is no tenderness.  Musculoskeletal: Normal range of motion. Exhibits no edema.  Lymphadenopathy:    No cervical adenopathy.  Neurological: Alert and oriented to person, place, and time. Exhibits normal muscle tone. Gait normal. Coordination normal.  Skin: Skin is warm and dry. No rash noted. Not diaphoretic. No erythema. No pallor.  Psychiatric: Mood, memory and judgment normal.  Vitals reviewed.  LABORATORY DATA: Lab Results  Component Value Date   WBC 7.7 05/31/2022   HGB 13.4 05/31/2022   HCT 40.9 05/31/2022   MCV 83.5 05/31/2022   PLT 325 05/31/2022      Chemistry      Component Value Date/Time   NA 139 05/31/2022 0759   NA CANCELED 12/04/2021 1545   K 3.7 05/31/2022 0759   CL 105 05/31/2022 0759   CO2 27 05/31/2022 0759   BUN 8 05/31/2022 0759   BUN CANCELED 12/04/2021 1545   CREATININE 0.78 05/31/2022 0759      Component Value Date/Time   CALCIUM 9.5 05/31/2022 0759   ALKPHOS 66 05/31/2022 0759   AST 14 (L) 05/31/2022 0759   ALT 12 05/31/2022 0759   BILITOT 0.5 05/31/2022 0759       RADIOGRAPHIC STUDIES:  DG Chest 2 View  Result  Date: 05/18/2022 CLINICAL DATA:  Esophageal cancer. Postop resection right upper lobe for pneumothorax. EXAM: CHEST - 2 VIEW COMPARISON:  Chest 05/02/2022 FINDINGS: Surgical staples in the right apex. No pneumothorax. The right hemidiaphragm remains elevated. Mild right lower lobe atelectasis or infiltrate slightly more prominent. No effusion Left lung is clear.  Left apical blebs.  Negative for heart failure IMPRESSION: 1. Postop changes right apex. No pneumothorax. 2. Mild right lower lobe atelectasis or infiltrate. Electronically Signed   By: Franchot Gallo M.D.   On: 05/18/2022 11:37     ASSESSMENT/PLAN:  This is a very pleasant 51 year old Caucasian male with:  Esophageal cancer ([place]) -Adenocarcinoma. cT1N0M0, stage MMR normal, HER2(+), PD-L1 2%  -h/o Barrett's esophagus. Screening EGD on 12/16/21 showed only reflux esophagitis, no bleeding. Pathology revealed invasive moderately differentiated adenocarcinoma at 36 cm.  -staging PET scan 01/28/22 showed no FDG-avid disease.  -The patient declined surgery.  The patient wished to try chemo RT. -He is currently undergoing concurrent chemoradiation which started on 05/24/2022.  Chemotherapy is carboplatin for an AUC of 2 and paclitaxel 45 mg/m.  He status post 2 weeks of treatment. -He is tolerating treatment well.  Labs were reviewed.  Recommend he proceed with cycle #3 today scheduled. He is asking if he can have his last infusion on 12/29 due to avoiding paying for copays for treatment on 1/2. I told him this would be too early with having infusion on 12/26. We will see him back for follow-up visit in 1 week for evaluation and repeat blood work before undergoing week #4.  2. Anxiety -He is currently prescribed Xanax twice daily as needed  3. Hiccups/Gas -He has hiccups following treatment, not relieved with baclofen -I have sent him a prescription of gabapentin to try in addition to baclofen. If this does not work, can consider thorazine;  however, he would not be able to take this with compazine.  -I also gave the patient a handout of manuvers to try to stop hiccups.  -The patient is already on PPI and using gas x. I also gave him a list of foods to avoid that exacerbate gas.   Plan: -Labs reviewed.  Recommend that he proceed with #3 today scheduled. -We will see him back for follow-up visit next week before starting week #4 of treatment. -Gabapentin for hiccups -Hand outs of maneuvers to help with hiccups and food to avoid for exacerbation of gas.   No orders of the defined types were placed in this encounter.    The total time spent in the appointment was 20-29  minutes.   Tanea Moga L Laronda Lisby, PA-C 06/04/22

## 2022-06-07 ENCOUNTER — Inpatient Hospital Stay: Payer: BC Managed Care – PPO | Admitting: Nutrition

## 2022-06-07 ENCOUNTER — Ambulatory Visit
Admission: RE | Admit: 2022-06-07 | Discharge: 2022-06-07 | Disposition: A | Discharge status: Home / Self Care | Payer: BC Managed Care – PPO | Source: Ambulatory Visit | Attending: Radiation Oncology | Admitting: Radiation Oncology

## 2022-06-07 ENCOUNTER — Other Ambulatory Visit: Payer: Self-pay

## 2022-06-07 ENCOUNTER — Inpatient Hospital Stay: Payer: BC Managed Care – PPO

## 2022-06-07 ENCOUNTER — Inpatient Hospital Stay (HOSPITAL_BASED_OUTPATIENT_CLINIC_OR_DEPARTMENT_OTHER): Payer: BC Managed Care – PPO | Admitting: Physician Assistant

## 2022-06-07 VITALS — BP 116/69 | HR 92 | Temp 98.4°F | Resp 18

## 2022-06-07 VITALS — BP 103/58 | HR 101 | Temp 98.2°F | Resp 17 | Wt 232.3 lb

## 2022-06-07 DIAGNOSIS — C155 Malignant neoplasm of lower third of esophagus: Secondary | ICD-10-CM

## 2022-06-07 DIAGNOSIS — Z510 Encounter for antineoplastic radiation therapy: Secondary | ICD-10-CM | POA: Diagnosis not present

## 2022-06-07 DIAGNOSIS — R066 Hiccough: Secondary | ICD-10-CM | POA: Diagnosis not present

## 2022-06-07 LAB — CBC WITH DIFFERENTIAL (CANCER CENTER ONLY)
Abs Immature Granulocytes: 0.06 10*3/uL (ref 0.00–0.07)
Basophils Absolute: 0 10*3/uL (ref 0.0–0.1)
Basophils Relative: 0 %
Eosinophils Absolute: 0.1 10*3/uL (ref 0.0–0.5)
Eosinophils Relative: 1 %
HCT: 39.3 % (ref 39.0–52.0)
Hemoglobin: 13 g/dL (ref 13.0–17.0)
Immature Granulocytes: 1 %
Lymphocytes Relative: 11 %
Lymphs Abs: 0.6 10*3/uL — ABNORMAL LOW (ref 0.7–4.0)
MCH: 27.7 pg (ref 26.0–34.0)
MCHC: 33.1 g/dL (ref 30.0–36.0)
MCV: 83.6 fL (ref 80.0–100.0)
Monocytes Absolute: 0.6 10*3/uL (ref 0.1–1.0)
Monocytes Relative: 10 %
Neutro Abs: 4.4 10*3/uL (ref 1.7–7.7)
Neutrophils Relative %: 77 %
Platelet Count: 244 10*3/uL (ref 150–400)
RBC: 4.7 MIL/uL (ref 4.22–5.81)
RDW: 17.4 % — ABNORMAL HIGH (ref 11.5–15.5)
WBC Count: 5.8 10*3/uL (ref 4.0–10.5)
nRBC: 0 % (ref 0.0–0.2)

## 2022-06-07 LAB — RAD ONC ARIA SESSION SUMMARY
Course Elapsed Days: 14
Plan Fractions Treated to Date: 11
Plan Prescribed Dose Per Fraction: 1.8 Gy
Plan Total Fractions Prescribed: 25
Plan Total Prescribed Dose: 45 Gy
Reference Point Dosage Given to Date: 19.8 Gy
Reference Point Session Dosage Given: 1.8 Gy
Session Number: 11

## 2022-06-07 LAB — CMP (CANCER CENTER ONLY)
ALT: 12 U/L (ref 0–44)
AST: 14 U/L — ABNORMAL LOW (ref 15–41)
Albumin: 3.8 g/dL (ref 3.5–5.0)
Alkaline Phosphatase: 76 U/L (ref 38–126)
Anion gap: 7 (ref 5–15)
BUN: 7 mg/dL (ref 6–20)
CO2: 26 mmol/L (ref 22–32)
Calcium: 9.3 mg/dL (ref 8.9–10.3)
Chloride: 104 mmol/L (ref 98–111)
Creatinine: 0.76 mg/dL (ref 0.61–1.24)
GFR, Estimated: >60 mL/min (ref >60)
Glucose, Bld: 88 mg/dL (ref 70–99)
Potassium: 3.6 mmol/L (ref 3.5–5.1)
Sodium: 137 mmol/L (ref 135–145)
Total Bilirubin: 0.4 mg/dL (ref 0.3–1.2)
Total Protein: 7.4 g/dL (ref 6.5–8.1)

## 2022-06-07 MED ORDER — SODIUM CHLORIDE 0.9 % IV SOLN
300.0000 mg | Freq: Once | INTRAVENOUS | Status: AC
  Administered 2022-06-07: 300 mg via INTRAVENOUS
  Filled 2022-06-07: qty 30

## 2022-06-07 MED ORDER — SODIUM CHLORIDE 0.9% FLUSH: 10.0000 mL | INTRAVENOUS | Status: DC | PRN

## 2022-06-07 MED ORDER — PALONOSETRON HCL INJECTION 0.25 MG/5ML
0.2500 mg | Freq: Once | INTRAVENOUS | Status: AC
  Administered 2022-06-07: 0.25 mg via INTRAVENOUS
  Filled 2022-06-07: qty 5

## 2022-06-07 MED ORDER — SODIUM CHLORIDE 0.9 % IV SOLN
10.0000 mg | Freq: Once | INTRAVENOUS | Status: AC
  Administered 2022-06-07: 10 mg via INTRAVENOUS
  Filled 2022-06-07: qty 10

## 2022-06-07 MED ORDER — SODIUM CHLORIDE 0.9 % IV SOLN
50.0000 mg/m2 | Freq: Once | INTRAVENOUS | Status: AC
  Administered 2022-06-07: 114 mg via INTRAVENOUS
  Filled 2022-06-07: qty 19

## 2022-06-07 MED ORDER — FAMOTIDINE IN NACL 20-0.9 MG/50ML-% IV SOLN
20.0000 mg | Freq: Once | INTRAVENOUS | Status: AC
  Administered 2022-06-07: 20 mg via INTRAVENOUS
  Filled 2022-06-07: qty 50

## 2022-06-07 MED ORDER — GABAPENTIN 100 MG PO CAPS: 100.0000 mg | ORAL_CAPSULE | Freq: Three times a day (TID) | ORAL | 2 refills | Status: DC

## 2022-06-07 MED ORDER — DIPHENHYDRAMINE HCL 50 MG/ML IJ SOLN
50.0000 mg | Freq: Once | INTRAMUSCULAR | Status: AC
  Administered 2022-06-07: 50 mg via INTRAVENOUS
  Filled 2022-06-07: qty 1

## 2022-06-07 MED ORDER — SODIUM CHLORIDE 0.9 % IV SOLN: Freq: Once | INTRAVENOUS | Status: AC

## 2022-06-07 NOTE — Progress Notes (Signed)
Nutrition follow-up completed with patient during infusion for esophageal cancer.  Weight documented as 232 pounds 5 ounces on December 11.  This is decreased from 236 pounds 1.6 ounces December 4.  He is still within his usual body weight range.  Labs were reviewed.  Patient reports he is still trying to eat as much as he possibly can.  States he is going to eat as long as he can.  He thinks it is getting slightly more difficult to swallow.  He thinks gravy was "hanging" in his throat yesterday.  Patient enjoyed strawberry Ensure samples previously provided.  However, patient unable to find Ensure at this time due to product being out of stock.  Nutrition diagnosis: Predicted sub-optimal energy intake continues.  Intervention: Stressed importance of weight maintenance overall. Continue small frequent meals with high-calorie, high-protein foods. Continue soft moist foods as needed. Educated on oral nutrition supplement options.   Provided coupons for when patient is able to purchase Ensure.  Monitoring, evaluation, goals: Patient will tolerate adequate calories and protein for weight maintenance.  Next visit: Monday, December 18 during infusion.  **Disclaimer: This note was dictated with voice recognition software. Similar sounding words can inadvertently be transcribed and this note may contain transcription errors which may not have been corrected upon publication of note.**

## 2022-06-08 ENCOUNTER — Other Ambulatory Visit: Payer: Self-pay

## 2022-06-08 ENCOUNTER — Ambulatory Visit
Admission: RE | Admit: 2022-06-08 | Discharge: 2022-06-08 | Disposition: A | Discharge status: Home / Self Care | Payer: BC Managed Care – PPO | Source: Ambulatory Visit | Attending: Radiation Oncology | Admitting: Radiation Oncology

## 2022-06-08 DIAGNOSIS — C155 Malignant neoplasm of lower third of esophagus: Secondary | ICD-10-CM | POA: Diagnosis not present

## 2022-06-08 DIAGNOSIS — Z510 Encounter for antineoplastic radiation therapy: Secondary | ICD-10-CM | POA: Diagnosis not present

## 2022-06-08 LAB — RAD ONC ARIA SESSION SUMMARY
Course Elapsed Days: 15
Plan Fractions Treated to Date: 12
Plan Prescribed Dose Per Fraction: 1.8 Gy
Plan Total Fractions Prescribed: 25
Plan Total Prescribed Dose: 45 Gy
Reference Point Dosage Given to Date: 21.6 Gy
Reference Point Session Dosage Given: 1.8 Gy
Session Number: 12

## 2022-06-09 ENCOUNTER — Ambulatory Visit
Admission: RE | Admit: 2022-06-09 | Discharge: 2022-06-09 | Disposition: A | Discharge status: Home / Self Care | Payer: BC Managed Care – PPO | Source: Ambulatory Visit | Attending: Radiation Oncology | Admitting: Radiation Oncology

## 2022-06-09 ENCOUNTER — Other Ambulatory Visit: Payer: Self-pay

## 2022-06-09 DIAGNOSIS — Z510 Encounter for antineoplastic radiation therapy: Secondary | ICD-10-CM | POA: Diagnosis not present

## 2022-06-09 DIAGNOSIS — C155 Malignant neoplasm of lower third of esophagus: Secondary | ICD-10-CM | POA: Diagnosis not present

## 2022-06-09 LAB — RAD ONC ARIA SESSION SUMMARY
Course Elapsed Days: 16
Plan Fractions Treated to Date: 13
Plan Prescribed Dose Per Fraction: 1.8 Gy
Plan Total Fractions Prescribed: 25
Plan Total Prescribed Dose: 45 Gy
Reference Point Dosage Given to Date: 23.4 Gy
Reference Point Session Dosage Given: 1.8 Gy
Session Number: 13

## 2022-06-10 ENCOUNTER — Other Ambulatory Visit: Payer: Self-pay

## 2022-06-10 ENCOUNTER — Telehealth: Payer: Self-pay | Admitting: Hematology

## 2022-06-10 ENCOUNTER — Ambulatory Visit
Admission: RE | Admit: 2022-06-10 | Discharge: 2022-06-10 | Disposition: A | Discharge status: Home / Self Care | Payer: BC Managed Care – PPO | Source: Ambulatory Visit | Attending: Radiation Oncology | Admitting: Radiation Oncology

## 2022-06-10 DIAGNOSIS — Z510 Encounter for antineoplastic radiation therapy: Secondary | ICD-10-CM | POA: Diagnosis not present

## 2022-06-10 DIAGNOSIS — C155 Malignant neoplasm of lower third of esophagus: Secondary | ICD-10-CM | POA: Diagnosis not present

## 2022-06-10 LAB — RAD ONC ARIA SESSION SUMMARY
Course Elapsed Days: 17
Plan Fractions Treated to Date: 14
Plan Prescribed Dose Per Fraction: 1.8 Gy
Plan Total Fractions Prescribed: 25
Plan Total Prescribed Dose: 45 Gy
Reference Point Dosage Given to Date: 25.2 Gy
Reference Point Session Dosage Given: 1.8 Gy
Session Number: 14

## 2022-06-10 NOTE — Telephone Encounter (Signed)
Called to inform patient about a slight change to one of his future appointments. Patient notified and requested to speak with RN. Forwarding his information to his requested RN

## 2022-06-11 ENCOUNTER — Other Ambulatory Visit: Payer: Self-pay

## 2022-06-11 ENCOUNTER — Ambulatory Visit
Admission: RE | Admit: 2022-06-11 | Discharge: 2022-06-11 | Disposition: A | Discharge status: Home / Self Care | Payer: BC Managed Care – PPO | Source: Ambulatory Visit | Attending: Radiation Oncology | Admitting: Radiation Oncology

## 2022-06-11 DIAGNOSIS — C155 Malignant neoplasm of lower third of esophagus: Secondary | ICD-10-CM | POA: Diagnosis not present

## 2022-06-11 DIAGNOSIS — Z510 Encounter for antineoplastic radiation therapy: Secondary | ICD-10-CM | POA: Diagnosis not present

## 2022-06-11 LAB — RAD ONC ARIA SESSION SUMMARY
Course Elapsed Days: 18
Plan Fractions Treated to Date: 15
Plan Prescribed Dose Per Fraction: 1.8 Gy
Plan Total Fractions Prescribed: 25
Plan Total Prescribed Dose: 45 Gy
Reference Point Dosage Given to Date: 27 Gy
Reference Point Session Dosage Given: 1.8 Gy
Session Number: 15

## 2022-06-11 MED FILL — Dexamethasone Sodium Phosphate Inj 100 MG/10ML: INTRAMUSCULAR | Qty: 1 | Status: AC

## 2022-06-13 NOTE — Assessment & Plan Note (Signed)
cT1N0M0, stage MMR normal, HER2(+), PD-L1 2%  -Screening EGD showed Screening EGD on 12/16/21 showed only reflux esophagitis, no bleeding. Pathology revealed invasive moderately differentiated adenocarcinoma at 36 cm  --staging PET scan 01/28/22 showed no FDG-avid disease. -Pt declined surgery, and wants to try chemoRT -Pt started concurrent chemotherapy and radiation 05/24/2022. He has been tolerating well overall.

## 2022-06-14 ENCOUNTER — Ambulatory Visit
Admission: RE | Admit: 2022-06-14 | Discharge: 2022-06-14 | Disposition: A | Discharge status: Home / Self Care | Payer: BC Managed Care – PPO | Source: Ambulatory Visit | Attending: Radiation Oncology | Admitting: Radiation Oncology

## 2022-06-14 ENCOUNTER — Other Ambulatory Visit: Payer: Self-pay

## 2022-06-14 ENCOUNTER — Inpatient Hospital Stay: Payer: BC Managed Care – PPO | Admitting: Nutrition

## 2022-06-14 ENCOUNTER — Inpatient Hospital Stay: Payer: BC Managed Care – PPO

## 2022-06-14 ENCOUNTER — Other Ambulatory Visit: Payer: BC Managed Care – PPO

## 2022-06-14 ENCOUNTER — Encounter: Payer: Self-pay | Admitting: Hematology

## 2022-06-14 ENCOUNTER — Inpatient Hospital Stay (HOSPITAL_BASED_OUTPATIENT_CLINIC_OR_DEPARTMENT_OTHER): Payer: BC Managed Care – PPO | Admitting: Hematology

## 2022-06-14 ENCOUNTER — Ambulatory Visit: Payer: BC Managed Care – PPO

## 2022-06-14 VITALS — BP 139/83 | HR 90 | Resp 18 | Ht 70.0 in | Wt 229.0 lb

## 2022-06-14 DIAGNOSIS — Z510 Encounter for antineoplastic radiation therapy: Secondary | ICD-10-CM | POA: Diagnosis not present

## 2022-06-14 DIAGNOSIS — C155 Malignant neoplasm of lower third of esophagus: Secondary | ICD-10-CM

## 2022-06-14 LAB — CBC WITH DIFFERENTIAL (CANCER CENTER ONLY)
Abs Immature Granulocytes: 0.02 10*3/uL (ref 0.00–0.07)
Basophils Absolute: 0 10*3/uL (ref 0.0–0.1)
Basophils Relative: 0 %
Eosinophils Absolute: 0 10*3/uL (ref 0.0–0.5)
Eosinophils Relative: 0 %
HCT: 40.5 % (ref 39.0–52.0)
Hemoglobin: 13.3 g/dL (ref 13.0–17.0)
Immature Granulocytes: 1 %
Lymphocytes Relative: 23 %
Lymphs Abs: 1 10*3/uL (ref 0.7–4.0)
MCH: 27.3 pg (ref 26.0–34.0)
MCHC: 32.8 g/dL (ref 30.0–36.0)
MCV: 83.2 fL (ref 80.0–100.0)
Monocytes Absolute: 0.5 10*3/uL (ref 0.1–1.0)
Monocytes Relative: 11 %
Neutro Abs: 2.8 10*3/uL (ref 1.7–7.7)
Neutrophils Relative %: 65 %
Platelet Count: 291 10*3/uL (ref 150–400)
RBC: 4.87 MIL/uL (ref 4.22–5.81)
RDW: 18.1 % — ABNORMAL HIGH (ref 11.5–15.5)
WBC Count: 4.3 10*3/uL (ref 4.0–10.5)
nRBC: 0 % (ref 0.0–0.2)

## 2022-06-14 LAB — CMP (CANCER CENTER ONLY)
ALT: 16 U/L (ref 0–44)
AST: 22 U/L (ref 15–41)
Albumin: 3.9 g/dL (ref 3.5–5.0)
Alkaline Phosphatase: 78 U/L (ref 38–126)
Anion gap: 7 (ref 5–15)
BUN: 9 mg/dL (ref 6–20)
CO2: 25 mmol/L (ref 22–32)
Calcium: 9.3 mg/dL (ref 8.9–10.3)
Chloride: 106 mmol/L (ref 98–111)
Creatinine: 0.92 mg/dL (ref 0.61–1.24)
GFR, Estimated: >60 mL/min (ref >60)
Glucose, Bld: 118 mg/dL — ABNORMAL HIGH (ref 70–99)
Potassium: 4.5 mmol/L (ref 3.5–5.1)
Sodium: 138 mmol/L (ref 135–145)
Total Bilirubin: 0.3 mg/dL (ref 0.3–1.2)
Total Protein: 7.2 g/dL (ref 6.5–8.1)

## 2022-06-14 LAB — RAD ONC ARIA SESSION SUMMARY
Course Elapsed Days: 21
Plan Fractions Treated to Date: 16
Plan Prescribed Dose Per Fraction: 1.8 Gy
Plan Total Fractions Prescribed: 25
Plan Total Prescribed Dose: 45 Gy
Reference Point Dosage Given to Date: 28.8 Gy
Reference Point Session Dosage Given: 1.8 Gy
Session Number: 16

## 2022-06-14 MED ORDER — SODIUM CHLORIDE 0.9 % IV SOLN: Freq: Once | INTRAVENOUS | Status: AC

## 2022-06-14 MED ORDER — AMOXICILLIN-POT CLAVULANATE 875-125 MG PO TABS: 1.0000 | ORAL_TABLET | Freq: Two times a day (BID) | ORAL | 0 refills | Status: DC

## 2022-06-14 MED ORDER — SODIUM CHLORIDE 0.9 % IV SOLN
50.0000 mg/m2 | Freq: Once | INTRAVENOUS | Status: AC
  Administered 2022-06-14: 114 mg via INTRAVENOUS
  Filled 2022-06-14: qty 19

## 2022-06-14 MED ORDER — PALONOSETRON HCL INJECTION 0.25 MG/5ML
0.2500 mg | Freq: Once | INTRAVENOUS | Status: AC
  Administered 2022-06-14: 0.25 mg via INTRAVENOUS
  Filled 2022-06-14: qty 5

## 2022-06-14 MED ORDER — DIPHENHYDRAMINE HCL 50 MG/ML IJ SOLN
50.0000 mg | Freq: Once | INTRAMUSCULAR | Status: AC
  Administered 2022-06-14: 50 mg via INTRAVENOUS
  Filled 2022-06-14: qty 1

## 2022-06-14 MED ORDER — SODIUM CHLORIDE 0.9 % IV SOLN
300.0000 mg | Freq: Once | INTRAVENOUS | Status: AC
  Administered 2022-06-14: 300 mg via INTRAVENOUS
  Filled 2022-06-14: qty 30

## 2022-06-14 MED ORDER — FAMOTIDINE IN NACL 20-0.9 MG/50ML-% IV SOLN
20.0000 mg | Freq: Once | INTRAVENOUS | Status: AC
  Administered 2022-06-14: 20 mg via INTRAVENOUS
  Filled 2022-06-14: qty 50

## 2022-06-14 MED ORDER — ALPRAZOLAM 0.25 MG PO TABS: 0.2500 mg | ORAL_TABLET | Freq: Two times a day (BID) | ORAL | 0 refills | Status: DC | PRN

## 2022-06-14 MED ORDER — SODIUM CHLORIDE 0.9 % IV SOLN
10.0000 mg | Freq: Once | INTRAVENOUS | Status: AC
  Administered 2022-06-14: 10 mg via INTRAVENOUS
  Filled 2022-06-14: qty 10

## 2022-06-14 NOTE — Progress Notes (Signed)
Nutrition follow-up completed with patient during infusion for esophageal cancer.  Weight decreased and documented as 229 pounds December 18 decreased from 232 pounds 5 ounces December 11.  Labs include glucose 118.  Patient denies problems chewing and swallowing.  States he can usually clear anything with the second sip of water.  He reports he ate [place] last night.  He tries to drink at least 1 Ensure plus or equivalent daily.  He denies nausea, vomiting, constipation, and diarrhea.  Nutrition diagnosis: Predicted sub-optimal energy intake continues.  Intervention: Educated patient to continue strategies for small amounts of high-calorie, high-protein foods throughout the day. Encouraged him to add sauces and gravies and continue adequate fluid intake with meals. Provided coupons for oral nutrition supplements.  Continue 1 minimum daily.  Monitoring, evaluation, goals: Patient will tolerate adequate calories and protein to minimize weight loss.  Next visit: Tuesday, December 26 during infusion.  **Disclaimer: This note was dictated with voice recognition software. Similar sounding words can inadvertently be transcribed and this note may contain transcription errors which may not have been corrected upon publication of note.**

## 2022-06-14 NOTE — Progress Notes (Signed)
[place]   Telephone:(336) [phone] Fax:(336) [phone]   Clinic Follow up Note   Patient Care Team: Crecencio Mc, MD as PCP - General (Internal Medicine) Bary Castilla [name], MD as Consulting Physician (General Surgery) Lesly Rubenstein, MD as Consulting Physician (Gastroenterology) Lerry Paterson, MD as Consulting Physician (Thoracic Surgery) Truitt Merle, MD as Consulting Physician (Hematology)  Date of Service:  06/14/2022  CHIEF COMPLAINT: f/u of Esophageal Cancer       CURRENT THERAPY:  Concurrent chemoradiation with carboplatin and paclitaxel weekly.  First dose expected on 05/24/2022    ASSESSMENT:  Bruce Graham is a 51 y.o. 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 Pathology revealed invasive moderately differentiated adenocarcinoma at 36 cm  --staging PET scan 01/28/22 showed no FDG-avid disease. -Pt declined surgery, and wants to try chemoRT -Pt started concurrent chemotherapy and radiation 05/24/2022. He has been tolerating well overall.      PLAN: -lab reviewed -Normal -Proceed with week 4 carboplatin and paclitaxel  - Refill Xanax for Anxiety -Called in Augmentin antibiotic for his sinus infection  -lab,f/u and chemo 06/22/22, okay to treat without seeing a provider if he is clinically doing well and lab adequate      SUMMARY OF ONCOLOGIC HISTORY: Oncology History  Esophageal cancer ([place])  12/16/2021 Procedure   Upper GI endoscopy, Dr. Bary Castilla  Impression: - LA Grade C reflux esophagitis with no bleeding. Rule out Barrett's esophagus. Biopsied. - Small hiatal hernia. - Normal examined duodenum.   12/16/2021 Initial Biopsy   DIAGNOSIS:  A. ESOPHAGUS, 40 CM; COLD BIOPSY:  - SQUAMOCOLUMNAR MUCOSA WITH INTESTINAL METAPLASIA, COMPATIBLE WITH HISTORY OF BARRETT'S ESOPHAGUS, WITH FOCAL LOW GRADE DYSPLASIA.   - NEGATIVE FOR HIGH-GRADE DYSPLASIA AND MALIGNANCY.   B. ESOPHAGUS, 38 CM; BIOPSY:  - SQUAMOCOLUMNAR MUCOSA WITH INTESTINAL METAPLASIA, COMPATIBLE WITH HISTORY OF BARRETT'S ESOPHAGUS, WITH FOCAL LOW GRADE DYSPLASIA.  - NEGATIVE FOR HIGH-GRADE DYSPLASIA AND MALIGNANCY.   C. ESOPHAGUS, 36 CM; COLD BIOPSY:  - INVASIVE MODERATELY DIFFERENTIATED ADENOCARCINOMA, ARISING IN A BACKGROUND OF BARRETT'S ESOPHAGUS WITH HIGH-GRADE DYSPLASIA.   D. COLON, CECUM; COLD BIOPSY:  - CHRONIC COLITIS WITH MODERATE ACTIVITY (CRYPTITIS AND CRYPT ABSCESSES).  - NEGATIVE FOR GRANULOMA, DYSPLASIA, AND MALIGNANCY.   E. COLON, DESCENDING; COLD BIOPSY:  - CHRONIC COLITIS WITH MODERATE ACTIVITY (CRYPTITIS AND CRYPT ABSCESSES). - NEGATIVE FOR GRANULOMA, DYSPLASIA, AND MALIGNANCY.   F. RECTUM; COLD BIOPSY:  - CHRONIC PROCTITIS WITH MODERATE ACTIVITY (CRYPTITIS AND CRYPT ABSCESSES).  - NEGATIVE FOR GRANULOMA, DYSPLASIA, AND MALIGNANCY.   Comment:  Biopsy sections taken from the cecum, descending colon, and rectum display crypt architectural abnormalities (crypt branching and bifid crypts), basal lymphoplasmacytosis, and active intramucosal inflammation ranging from cryptitis to crypt abscesses.  The features are highly suggestive of an evolving inflammatory bowel disease.  Clinical and endoscopic correlation is recommended.    12/16/2021 Cancer Staging   Staging form: Esophagus - Adenocarcinoma, AJCC 8th Edition - Clinical stage from 12/16/2021: Stage I (cT1b, cN0, cM0, G2) - Signed by Truitt Merle, MD on 04/27/2022 Stage prefix: Initial diagnosis Total positive nodes: 0 Histologic grading system: 3 grade system Type of surgical resection with curative intent: Endoscopic mucosal resection   12/17/2021 Initial Diagnosis   Esophageal cancer ([place])   12/25/2021 Imaging   EXAM: CT CHEST WITH CONTRAST  IMPRESSION: 1. Moderate wall thickening involving the distal esophagus near the GE junction  but no paraesophageal or  upper abdominal lymphadenopathy. 2. No findings for pulmonary metastatic disease. 3. Age advanced atherosclerotic calcifications involving the coronary arteries. 4. Advanced emphysematous changes with paraseptal bulla.   Aortic Atherosclerosis (ICD10-I70.0) and Emphysema (ICD10-J43.9).   01/04/2022 Procedure   Upper EUS, Dr. Mont Dutton  EGD Impressions: - Esophageal mucosal changes consistent with long-segment Barrett's esophagus. - Cratered esophageal ulcer at 40 cm (up to 8 mm in largest dimension). Biopsied. Suspect this is the site of previous biopsy proven adenocarcinoma. - Normal stomach. - Normal examined duodenum.   EUS Impressions: - There was no sign of significant pathology in the esophagus. No obvious esophageal mass was seen. Unable to T stage due to no mass lesion seen. - No periesophageal or perigastric lymph nodes visualized. - Normal visualized portions of the liver. - Normal celiac region.   01/04/2022 Pathology Results   DIAGNOSIS    A. Esophagus, 40 cm, esophageal ulcer, endoscopic biopsy:   Barrett esophagus with high-grade dysplasia. (See comment)   Cytomegalovirus esophagitis.    Comment: A p53 immunostain demonstrates loss of p53 expression within the atypical gastric mucosa, consistent with TP53 null mutant, supporting a diagnosis of high-grade dysplasia. CMV immunohistochemistry highlights rare positive cells, corroborated by viral cytopathic effect on H&E. PASD stain is negative for fungal organisms. Multiple step sections are examined.        01/28/2022 PET scan   FDG PET SBMT W NONDIAGNOSTIC CONCURRENT CT INITIAL   IMPRESSION:  1.  No evidence of FDG avid disease.   2.  Gluteal cleft findings favored to infectious/inflammatory, correlate with physical exam.    03/15/2022 Pathology Results   DIAGNOSIS    A. Esophagus, 36 cm, endomucosal resection:   Invasive, moderately differentiated adenocarcinoma. Tumor is seen throughout a thickened muscularis  mucosal layer. Only a small amount of submucosa is present, best seen in block A2, and here tumor is present within the submucosa. Cauterized tumor is present at the deep margin (orange ink) and a peripheral margin (blue ink). Negative for lymphovascular and perineural invasion. See summary in synoptic report.   B. Esophagus, 36 cm #2, endomucosal resection:   Invasive, moderately-differentiated adenocarcinoma. Tumor is seen throughout a thickened muscularis mucosal layer. A good representation of submucosal tissue is also present, with small areas of tumor seen in submucosal tissue and focally at the deep (orange ink) margin. Tumor is also present at a peripheral margin (blue ink). Negative for lymphovascular and perineural invasion. See summary in synoptic report.   C. Esophagus, 36 cm #3, endomucosal resection:   Invasive moderately differentiated adenocarcinoma. Tumor is focally seen in a thickened muscularis mucosal layer. A mucous gland is present but no submucosal tissue is present in this small specimen. Cauterized tumor is present at orange ink (the margin of this small specimen is entirely inked orange). Negative for lymphovascular and perineural invasion. See summary in synoptic report.   D. Esophagus, 39 cm, biopsy:   Gastric type mucosa. No squamous mucosa is seen. Negative for intestinal metaplasia.  Negative for dysplasia.   E. Esophagus, 37 cm, biopsy:    Focal area concerning for high grade dysplasia is seen in a background of squamocolumnar junctional mucosa with goblet cell intestinal metaplasia (Barrett's esophagus).  The focal area is best seen on level 1, deeper levels 3-5 do not demonstrate the concerning area. Negative for malignancy.   Comment:  See the synoptic report for summary.  While I very much suspect the T-stage is pT1b (submucosa), this is technically considered indefinite  in the presence of positive deep margins. MSI testing has been requested on  block B2 of this case; results will be issued separately.     05/24/2022 -  Chemotherapy   Patient is on Treatment Plan : ESOPHAGUS Carboplatin + Paclitaxel Weekly X 6 Weeks with XRT        INTERVAL HISTORY:  Bruce Graham is here for a follow up of Esophageal Cancer   He was last seen by me on 05/31/22 He presents to the clinic alone. Pt reports of a stuffy nose in the mornings.Pt reports of having a BM 2 x a day.     All other systems were reviewed with the patient and are negative.  MEDICAL HISTORY:  Past Medical History:  Diagnosis Date   Abscess of right thigh 06/16/2015   Barrett esophagus    Bronchitis    Complication of anesthesia    hard to wake up after hernia surgery   Ear infection 12/13/2017   taking amoxicillin   Family history of adverse reaction to anesthesia    mom hard to wake up    GERD (gastroesophageal reflux disease)    HLD (hyperlipidemia)    Hydradenitis    Left leg   Hypertension    Intervertebral disc disorder    Myalgia    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/07/2015   Suppurative hidradenitis    Umbilical hernia without obstruction and without gangrene 11/22/2017    SURGICAL HISTORY: Past Surgical History:  Procedure Laterality Date   COLONOSCOPY WITH PROPOFOL N/A 03/06/2018   Procedure: COLONOSCOPY WITH PROPOFOL;  Surgeon: Lin Landsman, MD;  Location: [facility] ENDOSCOPY;  Service: Gastroenterology;  Laterality: N/A;   COLONOSCOPY WITH PROPOFOL N/A 12/16/2021   Procedure: COLONOSCOPY WITH PROPOFOL;  Surgeon: Robert Bellow, MD;  Location: [facility] ENDOSCOPY;  Service: Endoscopy;  Laterality: N/A;   ESOPHAGOGASTRODUODENOSCOPY (EGD) WITH PROPOFOL N/A 03/06/2018   Procedure: ESOPHAGOGASTRODUODENOSCOPY (EGD) WITH PROPOFOL;  Surgeon: Lin Landsman, MD;  Location: [facility] ENDOSCOPY;  Service: Gastroenterology;  Laterality: N/A;   ESOPHAGOGASTRODUODENOSCOPY (EGD) WITH PROPOFOL N/A 12/16/2021   Procedure: ESOPHAGOGASTRODUODENOSCOPY (EGD) WITH PROPOFOL;   Surgeon: Robert Bellow, MD;  Location: [facility] ENDOSCOPY;  Service: Endoscopy;  Laterality: N/A;   HERNIA REPAIR     INCISION AND DRAINAGE ABSCESS N/A 05/02/2020   Procedure: INCISION AND DRAINAGE ABSCESS;  Surgeon: Robert Bellow, MD;  Location: [facility] ORS;  Service: General;  Laterality: N/A;   INCISION AND DRAINAGE PERIRECTAL ABSCESS Right 06/16/2015   Procedure: IRRIGATION AND DEBRIDEMENT right inner thigh ABSCESS;  Surgeon: Florene Glen, MD;  Location: [facility] ORS;  Service: General;  Laterality: Right;   INSERTION OF MESH N/A 12/20/2017   Procedure: INSERTION OF MESH;  Surgeon: [name], MD;  Location: [facility] ORS;  Service: General;  Laterality: N/A;   KNEE ARTHROSCOPY WITH ANTERIOR CRUCIATE LIGAMENT (ACL) REPAIR Left 2002   KNEE SURGERY Left    PILONIDAL CYST EXCISION N/A 05/02/2020   Procedure: CYST EXCISION PILONIDAL EXTENSIVE;  Surgeon: Robert Bellow, MD;  Location: [facility] ORS;  Service: General;  Laterality: N/A;   UMBILICAL HERNIA REPAIR N/A 12/20/2017   Procedure: HERNIA REPAIR UMBILICAL ADULT;  Surgeon: [name], MD;  Location: [facility] ORS;  Service: General;  Laterality: N/A;   VIDEO ASSISTED THORACOSCOPY (VATS)/WEDGE RESECTION Right 04/29/2022   Procedure: RIGHT VIDEO ASSISTED THORACOSCOPY (VATS)/ STAPELING OF BLEBS;  Surgeon: [name], MD;  Location: [facility] OR;  Service: Thoracic;  Laterality: Right;    I have reviewed the social history and  family history with the patient and they are unchanged from previous note.  ALLERGIES:  has No Known Allergies.  MEDICATIONS:  Current Outpatient Medications  Medication Sig Dispense Refill   amoxicillin-clavulanate (AUGMENTIN) 875-125 MG tablet Take 1 tablet by mouth 2 (two) times daily. 14 tablet 0   albuterol (VENTOLIN HFA) 108 (90 Base) MCG/ACT inhaler Inhale 2 puffs into the lungs every 6 (six) hours as needed for wheezing or shortness of breath. 8 g 2   ALPRAZolam (XANAX) 0.25 MG tablet Take 1 tablet  (0.25 mg total) by mouth 2 (two) times daily as needed for anxiety. 30 tablet 0   amLODipine (NORVASC) 2.5 MG tablet Take 1 tablet (2.5 mg total) by mouth daily. 90 tablet 1   baclofen (LIORESAL) 10 MG tablet Take 1 tablet (10 mg total) by mouth 2 (two) times daily. 30 each 0   doxycycline (VIBRA-TABS) 100 MG tablet Take 1 tablet (100 mg total) by mouth 2 (two) times daily. 14 tablet 0   ezetimibe (ZETIA) 10 MG tablet TAKE 1 TABLET BY MOUTH ONCE DAILY 90 tablet 3   gabapentin (NEURONTIN) 100 MG capsule Take 1 capsule (100 mg total) by mouth 3 (three) times daily. 30 capsule 2   ondansetron (ZOFRAN) 8 MG tablet Take 1 tablet (8 mg total) by mouth every 8 (eight) hours as needed for nausea or vomiting. Starting from day 3 30 tablet 2   ondansetron (ZOFRAN) 8 MG tablet Take 1 tablet (8 mg total) by mouth every 8 (eight) hours as needed for nausea or vomiting. Start on the third day after chemotherapy. 30 tablet 1   pantoprazole (PROTONIX) 40 MG tablet Take 1 tablet (40 mg total) by mouth daily. 30 tablet 0   predniSONE (DELTASONE) 10 MG tablet 6 tablets on Day 1 , then reduce by 1 tablet daily until gone 21 tablet 0   prochlorperazine (COMPAZINE) 10 MG tablet Take 1 tablet (10 mg total) by mouth every 6 (six) hours as needed. 30 tablet 2   prochlorperazine (COMPAZINE) 10 MG tablet Take 1 tablet (10 mg total) by mouth every 6 (six) hours as needed for nausea or vomiting. 30 tablet 1   No current facility-administered medications for this visit.    PHYSICAL EXAMINATION: ECOG PERFORMANCE STATUS: 0 - Asymptomatic  Vitals:   06/14/22 1053  BP: 139/83  Pulse: 90  Resp: 18  SpO2: 100%   Wt Readings from Last 3 Encounters:  06/14/22 229 lb (103.9 kg)  06/07/22 232 lb 4.8 oz (105.4 kg)  05/31/22 236 lb 1.6 oz (107.1 kg)    GENERAL:alert, no distress and comfortable SKIN: skin color, texture, turgor are normal, no rashes or significant lesions EYES: normal, Conjunctiva are pink and non-injected,  sclera clear  NECK: supple, thyroid normal size, non-tender, without nodularity LYMPH:  no palpable lymphadenopathy in the cervical, axillary  LUNGS: clear to auscultation and percussion with normal breathing effort HEART: regular rate & rhythm and no murmurs and no lower extremity edema Musculoskeletal:no cyanosis of digits and no clubbing  NEURO: alert & oriented x 3 with fluent speech, no focal motor/sensory deficits  LABORATORY DATA:  I have reviewed the data as listed    Latest Ref Rng & Units 06/14/2022    9:44 AM 06/07/2022    9:30 AM 05/31/2022    7:59 AM  CBC  WBC 4.0 - 10.5 K/uL 4.3  5.8  7.7   Hemoglobin 13.0 - 17.0 g/dL 13.3  13.0  13.4   Hematocrit 39.0 - 52.0 %  40.5  39.3  40.9   Platelets 150 - 400 K/uL 291  244  325         Latest Ref Rng & Units 06/14/2022    9:44 AM 06/07/2022    9:30 AM 05/31/2022    7:59 AM  CMP  Glucose 70 - 99 mg/dL 118  88  88   BUN 6 - 20 mg/dL _0 Creatinine 0.61 - 1.24 mg/dL 0.92  0.76  0.78   Sodium 135 - 145 mmol/L 138  137  139   Potassium 3.5 - 5.1 mmol/L 4.5  3.6  3.7   Chloride 98 - 111 mmol/L 106  104  105   CO2 22 - 32 mmol/L _1 Calcium 8.9 - 10.3 mg/dL 9.3  9.3  9.5   Total Protein 6.5 - 8.1 g/dL 7.2  7.4  7.9   Total Bilirubin 0.3 - 1.2 mg/dL 0.3  0.4  0.5   Alkaline Phos 38 - 126 U/L 78  76  66   AST 15 - 41 U/L _2 ALT 0 - 44 U/L _3 RADIOGRAPHIC STUDIES: I have personally reviewed the radiological images as listed and agreed with the findings in the report. No results found.    No orders of the defined types were placed in this encounter.  All questions were answered. The patient knows to call the clinic with any problems, questions or concerns. No barriers to learning was detected. The total time spent in the appointment was 30 minutes.     Truitt Merle, MD 06/14/2022   Felicity Coyer, CMA, am acting as scribe for Truitt Merle, MD.   I have reviewed the above  documentation for accuracy and completeness, and I agree with the above.

## 2022-06-15 ENCOUNTER — Other Ambulatory Visit: Payer: Self-pay

## 2022-06-15 ENCOUNTER — Ambulatory Visit
Admission: RE | Admit: 2022-06-15 | Discharge: 2022-06-15 | Disposition: A | Discharge status: Home / Self Care | Payer: BC Managed Care – PPO | Source: Ambulatory Visit | Attending: Radiation Oncology | Admitting: Radiation Oncology

## 2022-06-15 DIAGNOSIS — Z510 Encounter for antineoplastic radiation therapy: Secondary | ICD-10-CM | POA: Diagnosis not present

## 2022-06-15 DIAGNOSIS — C155 Malignant neoplasm of lower third of esophagus: Secondary | ICD-10-CM | POA: Diagnosis not present

## 2022-06-15 LAB — RAD ONC ARIA SESSION SUMMARY
Course Elapsed Days: 22
Plan Fractions Treated to Date: 17
Plan Prescribed Dose Per Fraction: 1.8 Gy
Plan Total Fractions Prescribed: 25
Plan Total Prescribed Dose: 45 Gy
Reference Point Dosage Given to Date: 30.6 Gy
Reference Point Session Dosage Given: 1.8 Gy
Session Number: 17

## 2022-06-15 NOTE — Progress Notes (Signed)
Mr Cafarelli gave me his FMLA paperwork to be completed.  I gave these to [facility]

## 2022-06-16 ENCOUNTER — Other Ambulatory Visit: Payer: Self-pay

## 2022-06-16 ENCOUNTER — Ambulatory Visit
Admission: RE | Admit: 2022-06-16 | Discharge: 2022-06-16 | Disposition: A | Discharge status: Home / Self Care | Payer: BC Managed Care – PPO | Source: Ambulatory Visit | Attending: Radiation Oncology | Admitting: Radiation Oncology

## 2022-06-16 DIAGNOSIS — Z510 Encounter for antineoplastic radiation therapy: Secondary | ICD-10-CM | POA: Diagnosis not present

## 2022-06-16 DIAGNOSIS — C155 Malignant neoplasm of lower third of esophagus: Secondary | ICD-10-CM | POA: Diagnosis not present

## 2022-06-16 LAB — RAD ONC ARIA SESSION SUMMARY
Course Elapsed Days: 23
Plan Fractions Treated to Date: 18
Plan Prescribed Dose Per Fraction: 1.8 Gy
Plan Total Fractions Prescribed: 25
Plan Total Prescribed Dose: 45 Gy
Reference Point Dosage Given to Date: 32.4 Gy
Reference Point Session Dosage Given: 1.8 Gy
Session Number: 18

## 2022-06-17 ENCOUNTER — Other Ambulatory Visit: Payer: Self-pay

## 2022-06-17 ENCOUNTER — Ambulatory Visit
Admission: RE | Admit: 2022-06-17 | Discharge: 2022-06-17 | Disposition: A | Discharge status: Home / Self Care | Payer: BC Managed Care – PPO | Source: Ambulatory Visit | Attending: Radiation Oncology | Admitting: Radiation Oncology

## 2022-06-17 DIAGNOSIS — Z510 Encounter for antineoplastic radiation therapy: Secondary | ICD-10-CM | POA: Diagnosis not present

## 2022-06-17 DIAGNOSIS — C155 Malignant neoplasm of lower third of esophagus: Secondary | ICD-10-CM | POA: Diagnosis not present

## 2022-06-17 LAB — RAD ONC ARIA SESSION SUMMARY
Course Elapsed Days: 24
Plan Fractions Treated to Date: 19
Plan Prescribed Dose Per Fraction: 1.8 Gy
Plan Total Fractions Prescribed: 25
Plan Total Prescribed Dose: 45 Gy
Reference Point Dosage Given to Date: 34.2 Gy
Reference Point Session Dosage Given: 1.8 Gy
Session Number: 19

## 2022-06-18 ENCOUNTER — Ambulatory Visit
Admission: RE | Admit: 2022-06-18 | Discharge: 2022-06-18 | Disposition: A | Discharge status: Home / Self Care | Payer: BC Managed Care – PPO | Source: Ambulatory Visit | Attending: Radiation Oncology | Admitting: Radiation Oncology

## 2022-06-18 ENCOUNTER — Other Ambulatory Visit: Payer: Self-pay

## 2022-06-18 DIAGNOSIS — C155 Malignant neoplasm of lower third of esophagus: Secondary | ICD-10-CM | POA: Diagnosis not present

## 2022-06-18 DIAGNOSIS — Z510 Encounter for antineoplastic radiation therapy: Secondary | ICD-10-CM | POA: Diagnosis not present

## 2022-06-18 LAB — RAD ONC ARIA SESSION SUMMARY
Course Elapsed Days: 25
Plan Fractions Treated to Date: 20
Plan Prescribed Dose Per Fraction: 1.8 Gy
Plan Total Fractions Prescribed: 25
Plan Total Prescribed Dose: 45 Gy
Reference Point Dosage Given to Date: 36 Gy
Reference Point Session Dosage Given: 1.8 Gy
Session Number: 20

## 2022-06-18 MED FILL — Dexamethasone Sodium Phosphate Inj 100 MG/10ML: INTRAMUSCULAR | Qty: 1 | Status: AC

## 2022-06-22 ENCOUNTER — Inpatient Hospital Stay: Payer: BC Managed Care – PPO

## 2022-06-22 ENCOUNTER — Ambulatory Visit
Admission: RE | Admit: 2022-06-22 | Discharge: 2022-06-22 | Disposition: A | Discharge status: Home / Self Care | Payer: BC Managed Care – PPO | Source: Ambulatory Visit | Attending: Radiation Oncology | Admitting: Radiation Oncology

## 2022-06-22 ENCOUNTER — Other Ambulatory Visit: Payer: Self-pay

## 2022-06-22 ENCOUNTER — Inpatient Hospital Stay: Payer: BC Managed Care – PPO | Admitting: Nutrition

## 2022-06-22 VITALS — BP 130/88 | HR 84 | Temp 98.0°F | Resp 18 | Wt 229.0 lb

## 2022-06-22 DIAGNOSIS — Z510 Encounter for antineoplastic radiation therapy: Secondary | ICD-10-CM | POA: Diagnosis not present

## 2022-06-22 DIAGNOSIS — C155 Malignant neoplasm of lower third of esophagus: Secondary | ICD-10-CM

## 2022-06-22 LAB — RAD ONC ARIA SESSION SUMMARY
Course Elapsed Days: 29
Plan Fractions Treated to Date: 21
Plan Prescribed Dose Per Fraction: 1.8 Gy
Plan Total Fractions Prescribed: 25
Plan Total Prescribed Dose: 45 Gy
Reference Point Dosage Given to Date: 37.8 Gy
Reference Point Session Dosage Given: 1.8 Gy
Session Number: 21

## 2022-06-22 LAB — CMP (CANCER CENTER ONLY)
ALT: 15 U/L (ref 0–44)
AST: 21 U/L (ref 15–41)
Albumin: 3.8 g/dL (ref 3.5–5.0)
Alkaline Phosphatase: 71 U/L (ref 38–126)
Anion gap: 6 (ref 5–15)
BUN: 7 mg/dL (ref 6–20)
CO2: 26 mmol/L (ref 22–32)
Calcium: 9.2 mg/dL (ref 8.9–10.3)
Chloride: 106 mmol/L (ref 98–111)
Creatinine: 0.85 mg/dL (ref 0.61–1.24)
GFR, Estimated: >60 mL/min (ref >60)
Glucose, Bld: 107 mg/dL — ABNORMAL HIGH (ref 70–99)
Potassium: 3.7 mmol/L (ref 3.5–5.1)
Sodium: 138 mmol/L (ref 135–145)
Total Bilirubin: 0.3 mg/dL (ref 0.3–1.2)
Total Protein: 7.5 g/dL (ref 6.5–8.1)

## 2022-06-22 LAB — CBC WITH DIFFERENTIAL (CANCER CENTER ONLY)
Abs Immature Granulocytes: 0.05 10*3/uL (ref 0.00–0.07)
Basophils Absolute: 0 10*3/uL (ref 0.0–0.1)
Basophils Relative: 0 %
Eosinophils Absolute: 0 10*3/uL (ref 0.0–0.5)
Eosinophils Relative: 0 %
HCT: 40.1 % (ref 39.0–52.0)
Hemoglobin: 13.3 g/dL (ref 13.0–17.0)
Immature Granulocytes: 1 %
Lymphocytes Relative: 11 %
Lymphs Abs: 0.6 10*3/uL — ABNORMAL LOW (ref 0.7–4.0)
MCH: 27.8 pg (ref 26.0–34.0)
MCHC: 33.2 g/dL (ref 30.0–36.0)
MCV: 83.9 fL (ref 80.0–100.0)
Monocytes Absolute: 1 10*3/uL (ref 0.1–1.0)
Monocytes Relative: 18 %
Neutro Abs: 3.8 10*3/uL (ref 1.7–7.7)
Neutrophils Relative %: 70 %
Platelet Count: 292 10*3/uL (ref 150–400)
RBC: 4.78 MIL/uL (ref 4.22–5.81)
RDW: 18.8 % — ABNORMAL HIGH (ref 11.5–15.5)
WBC Count: 5.4 10*3/uL (ref 4.0–10.5)
nRBC: 0 % (ref 0.0–0.2)

## 2022-06-22 MED ORDER — PALONOSETRON HCL INJECTION 0.25 MG/5ML
0.2500 mg | Freq: Once | INTRAVENOUS | Status: AC
  Administered 2022-06-22: 0.25 mg via INTRAVENOUS
  Filled 2022-06-22: qty 5

## 2022-06-22 MED ORDER — DIPHENHYDRAMINE HCL 50 MG/ML IJ SOLN
50.0000 mg | Freq: Once | INTRAMUSCULAR | Status: AC
  Administered 2022-06-22: 50 mg via INTRAVENOUS
  Filled 2022-06-22: qty 1

## 2022-06-22 MED ORDER — SODIUM CHLORIDE 0.9 % IV SOLN
50.0000 mg/m2 | Freq: Once | INTRAVENOUS | Status: AC
  Administered 2022-06-22: 114 mg via INTRAVENOUS
  Filled 2022-06-22: qty 19

## 2022-06-22 MED ORDER — FAMOTIDINE IN NACL 20-0.9 MG/50ML-% IV SOLN
20.0000 mg | Freq: Once | INTRAVENOUS | Status: AC
  Administered 2022-06-22: 20 mg via INTRAVENOUS
  Filled 2022-06-22: qty 50

## 2022-06-22 MED ORDER — SODIUM CHLORIDE 0.9 % IV SOLN
300.0000 mg | Freq: Once | INTRAVENOUS | Status: AC
  Administered 2022-06-22: 300 mg via INTRAVENOUS
  Filled 2022-06-22: qty 30

## 2022-06-22 MED ORDER — SODIUM CHLORIDE 0.9 % IV SOLN
10.0000 mg | Freq: Once | INTRAVENOUS | Status: AC
  Administered 2022-06-22: 10 mg via INTRAVENOUS
  Filled 2022-06-22: qty 10

## 2022-06-22 MED ORDER — SODIUM CHLORIDE 0.9 % IV SOLN: Freq: Once | INTRAVENOUS | Status: AC

## 2022-06-22 NOTE — Progress Notes (Signed)
Nutrition follow-up completed with patient during infusion for esophageal cancer.  Last weight documented was 229 pounds on December 26.  This is stable from 229 pounds December 18.  Labs reviewed.  Noted glucose 107.  Patient denies change in swallowing or chewing at this time.  He continues to use additional water sips if he experiences any problems swallowing.  He continues to drink 1 Ensure plus or equivalent daily.  He denies nausea, vomiting, constipation, and diarrhea.  He was able to eat regular textures of meats and other foods over the holiday.  Nutrition diagnosis: Predicted sub-optimal energy intake continues.  Intervention: Continue strategies for increased calorie and protein intake for weight maintenance. Continue oral nutrition supplements, 1 carton daily.  Provided coupons.  Monitoring, evaluation, goals: Patient will tolerate adequate calories and protein for continued weight maintenance.  Next visit: Tuesday, January 2 during infusion.  **Disclaimer: This note was dictated with voice recognition software. Similar sounding words can inadvertently be transcribed and this note may contain transcription errors which may not have been corrected upon publication of note.**

## 2022-06-22 NOTE — Patient Instructions (Signed)
[facility]  Discharge Instructions: Thank you for choosing [facility] to provide your oncology and hematology care.   If you have a lab appointment with the [facility], please go directly to the [facility] and check in at the registration area.   Wear comfortable clothing and clothing appropriate for easy access to any Portacath or PICC line.   We strive to give you quality time with your provider. You may need to reschedule your appointment if you arrive late (15 or more minutes).  Arriving late affects you and other patients whose appointments are after yours.  Also, if you miss three or more appointments without notifying the office, you may be dismissed from the clinic at the provider's discretion.      For prescription refill requests, have your pharmacy contact our office and allow 72 hours for refills to be completed.    Today you received the following chemotherapy and/or immunotherapy agents: Paclitaxel (Taxol) & Carboplatin     To help prevent nausea and vomiting after your treatment, we encourage you to take your nausea medication as directed.  BELOW ARE SYMPTOMS THAT SHOULD BE REPORTED IMMEDIATELY: *FEVER GREATER THAN 100.4 F (38 C) OR HIGHER *CHILLS OR SWEATING *NAUSEA AND VOMITING THAT IS NOT CONTROLLED WITH YOUR NAUSEA MEDICATION *UNUSUAL SHORTNESS OF BREATH *UNUSUAL BRUISING OR BLEEDING *URINARY PROBLEMS (pain or burning when urinating, or frequent urination) *BOWEL PROBLEMS (unusual diarrhea, constipation, pain near the anus) TENDERNESS IN MOUTH AND THROAT WITH OR WITHOUT PRESENCE OF ULCERS (sore throat, sores in mouth, or a toothache) UNUSUAL RASH, SWELLING OR PAIN  UNUSUAL VAGINAL DISCHARGE OR ITCHING   Items with * indicate a potential emergency and should be followed up as soon as possible or go to the Emergency Department if any problems should occur.  Please show the CHEMOTHERAPY ALERT CARD or IMMUNOTHERAPY  ALERT CARD at check-in to the Emergency Department and triage nurse.  Should you have questions after your visit or need to cancel or reschedule your appointment, please contact [facility]  Dept: [phone]  and follow the prompts.  Office hours are 8:00 a.m. to 4:30 p.m. Monday - Friday. Please note that voicemails left after 4:00 p.m. may not be returned until the following business day.  We are closed weekends and major holidays. You have access to a nurse at all times for urgent questions. Please call the main number to the clinic Dept: [phone] and follow the prompts.   For any non-urgent questions, you may also contact your provider using MyChart. We now offer e-Visits for anyone 44 and older to request care online for non-urgent symptoms. For details visit [email or web address].   Also download the MyChart app! Go to the app store, search "MyChart", open the app, select [facility], and log in with your MyChart username and password.  Masks are optional in the cancer centers. If you would like for your care team to wear a mask while they are taking care of you, please let them know. You may have one support Bruce Graham who is at least 52 years old accompany you for your appointments.

## 2022-06-23 ENCOUNTER — Other Ambulatory Visit: Payer: Self-pay

## 2022-06-23 ENCOUNTER — Ambulatory Visit
Admission: RE | Admit: 2022-06-23 | Discharge: 2022-06-23 | Disposition: A | Discharge status: Home / Self Care | Payer: BC Managed Care – PPO | Source: Ambulatory Visit | Attending: Radiation Oncology | Admitting: Radiation Oncology

## 2022-06-23 DIAGNOSIS — Z510 Encounter for antineoplastic radiation therapy: Secondary | ICD-10-CM | POA: Diagnosis not present

## 2022-06-23 DIAGNOSIS — C155 Malignant neoplasm of lower third of esophagus: Secondary | ICD-10-CM | POA: Diagnosis not present

## 2022-06-23 LAB — RAD ONC ARIA SESSION SUMMARY
Course Elapsed Days: 30
Plan Fractions Treated to Date: 22
Plan Prescribed Dose Per Fraction: 1.8 Gy
Plan Total Fractions Prescribed: 25
Plan Total Prescribed Dose: 45 Gy
Reference Point Dosage Given to Date: 39.6 Gy
Reference Point Session Dosage Given: 1.8 Gy
Session Number: 22

## 2022-06-24 ENCOUNTER — Ambulatory Visit
Admission: RE | Admit: 2022-06-24 | Discharge: 2022-06-24 | Disposition: A | Discharge status: Home / Self Care | Payer: BC Managed Care – PPO | Source: Ambulatory Visit | Attending: Radiation Oncology | Admitting: Radiation Oncology

## 2022-06-24 ENCOUNTER — Other Ambulatory Visit: Payer: Self-pay

## 2022-06-24 DIAGNOSIS — C155 Malignant neoplasm of lower third of esophagus: Secondary | ICD-10-CM | POA: Diagnosis not present

## 2022-06-24 DIAGNOSIS — Z510 Encounter for antineoplastic radiation therapy: Secondary | ICD-10-CM | POA: Diagnosis not present

## 2022-06-24 LAB — RAD ONC ARIA SESSION SUMMARY
Course Elapsed Days: 31
Plan Fractions Treated to Date: 23
Plan Prescribed Dose Per Fraction: 1.8 Gy
Plan Total Fractions Prescribed: 25
Plan Total Prescribed Dose: 45 Gy
Reference Point Dosage Given to Date: 41.4 Gy
Reference Point Session Dosage Given: 1.8 Gy
Session Number: 23

## 2022-06-25 ENCOUNTER — Other Ambulatory Visit: Payer: Self-pay

## 2022-06-25 ENCOUNTER — Ambulatory Visit
Admission: RE | Admit: 2022-06-25 | Discharge: 2022-06-25 | Disposition: A | Discharge status: Home / Self Care | Payer: BC Managed Care – PPO | Source: Ambulatory Visit | Attending: Radiation Oncology | Admitting: Radiation Oncology

## 2022-06-25 DIAGNOSIS — Z510 Encounter for antineoplastic radiation therapy: Secondary | ICD-10-CM | POA: Diagnosis not present

## 2022-06-25 DIAGNOSIS — C155 Malignant neoplasm of lower third of esophagus: Secondary | ICD-10-CM | POA: Diagnosis not present

## 2022-06-25 LAB — RAD ONC ARIA SESSION SUMMARY
Course Elapsed Days: 32
Plan Fractions Treated to Date: 24
Plan Prescribed Dose Per Fraction: 1.8 Gy
Plan Total Fractions Prescribed: 25
Plan Total Prescribed Dose: 45 Gy
Reference Point Dosage Given to Date: 43.2 Gy
Reference Point Session Dosage Given: 1.8 Gy
Session Number: 24

## 2022-06-25 MED FILL — Dexamethasone Sodium Phosphate Inj 100 MG/10ML: INTRAMUSCULAR | Qty: 1 | Status: AC

## 2022-06-29 ENCOUNTER — Inpatient Hospital Stay: Payer: BC Managed Care – PPO | Admitting: Hematology

## 2022-06-29 ENCOUNTER — Other Ambulatory Visit: Payer: Self-pay

## 2022-06-29 ENCOUNTER — Inpatient Hospital Stay: Payer: BC Managed Care – PPO | Attending: Physician Assistant

## 2022-06-29 ENCOUNTER — Encounter: Payer: Self-pay | Admitting: Adult Health

## 2022-06-29 ENCOUNTER — Ambulatory Visit
Admission: RE | Admit: 2022-06-29 | Discharge: 2022-06-29 | Disposition: A | Discharge status: Home / Self Care | Payer: BC Managed Care – PPO | Source: Ambulatory Visit | Attending: Radiation Oncology | Admitting: Radiation Oncology

## 2022-06-29 ENCOUNTER — Inpatient Hospital Stay: Payer: BC Managed Care – PPO

## 2022-06-29 ENCOUNTER — Inpatient Hospital Stay: Payer: BC Managed Care – PPO | Admitting: Nutrition

## 2022-06-29 ENCOUNTER — Inpatient Hospital Stay: Payer: BC Managed Care – PPO | Admitting: Adult Health

## 2022-06-29 VITALS — BP 119/83 | HR 78 | Resp 16

## 2022-06-29 DIAGNOSIS — C155 Malignant neoplasm of lower third of esophagus: Secondary | ICD-10-CM

## 2022-06-29 DIAGNOSIS — C159 Malignant neoplasm of esophagus, unspecified: Secondary | ICD-10-CM | POA: Insufficient documentation

## 2022-06-29 DIAGNOSIS — I10 Essential (primary) hypertension: Secondary | ICD-10-CM | POA: Insufficient documentation

## 2022-06-29 DIAGNOSIS — R110 Nausea: Secondary | ICD-10-CM | POA: Insufficient documentation

## 2022-06-29 DIAGNOSIS — Z510 Encounter for antineoplastic radiation therapy: Secondary | ICD-10-CM | POA: Diagnosis not present

## 2022-06-29 DIAGNOSIS — K30 Functional dyspepsia: Secondary | ICD-10-CM | POA: Insufficient documentation

## 2022-06-29 DIAGNOSIS — Z5111 Encounter for antineoplastic chemotherapy: Secondary | ICD-10-CM | POA: Insufficient documentation

## 2022-06-29 DIAGNOSIS — Z87891 Personal history of nicotine dependence: Secondary | ICD-10-CM | POA: Insufficient documentation

## 2022-06-29 DIAGNOSIS — Z801 Family history of malignant neoplasm of trachea, bronchus and lung: Secondary | ICD-10-CM | POA: Insufficient documentation

## 2022-06-29 DIAGNOSIS — Z800 Family history of malignant neoplasm of digestive organs: Secondary | ICD-10-CM | POA: Insufficient documentation

## 2022-06-29 LAB — CMP (CANCER CENTER ONLY)
ALT: 21 U/L (ref 0–44)
AST: 27 U/L (ref 15–41)
Albumin: 3.8 g/dL (ref 3.5–5.0)
Alkaline Phosphatase: 67 U/L (ref 38–126)
Anion gap: 5 (ref 5–15)
BUN: 7 mg/dL (ref 6–20)
CO2: 25 mmol/L (ref 22–32)
Calcium: 9.3 mg/dL (ref 8.9–10.3)
Chloride: 105 mmol/L (ref 98–111)
Creatinine: 0.77 mg/dL (ref 0.61–1.24)
GFR, Estimated: >60 mL/min (ref >60)
Glucose, Bld: 88 mg/dL (ref 70–99)
Potassium: 4.3 mmol/L (ref 3.5–5.1)
Sodium: 135 mmol/L (ref 135–145)
Total Bilirubin: 0.4 mg/dL (ref 0.3–1.2)
Total Protein: 7.5 g/dL (ref 6.5–8.1)

## 2022-06-29 LAB — RAD ONC ARIA SESSION SUMMARY
Course Elapsed Days: 36
Plan Fractions Treated to Date: 25
Plan Prescribed Dose Per Fraction: 1.8 Gy
Plan Total Fractions Prescribed: 25
Plan Total Prescribed Dose: 45 Gy
Reference Point Dosage Given to Date: 45 Gy
Reference Point Session Dosage Given: 1.8 Gy
Session Number: 25

## 2022-06-29 LAB — CBC WITH DIFFERENTIAL (CANCER CENTER ONLY)
Abs Immature Granulocytes: 0.02 10*3/uL (ref 0.00–0.07)
Basophils Absolute: 0 10*3/uL (ref 0.0–0.1)
Basophils Relative: 0 %
Eosinophils Absolute: 0 10*3/uL (ref 0.0–0.5)
Eosinophils Relative: 1 %
HCT: 41.4 % (ref 39.0–52.0)
Hemoglobin: 13.7 g/dL (ref 13.0–17.0)
Immature Granulocytes: 1 %
Lymphocytes Relative: 13 %
Lymphs Abs: 0.4 10*3/uL — ABNORMAL LOW (ref 0.7–4.0)
MCH: 28 pg (ref 26.0–34.0)
MCHC: 33.1 g/dL (ref 30.0–36.0)
MCV: 84.5 fL (ref 80.0–100.0)
Monocytes Absolute: 0.8 10*3/uL (ref 0.1–1.0)
Monocytes Relative: 25 %
Neutro Abs: 1.9 10*3/uL (ref 1.7–7.7)
Neutrophils Relative %: 60 %
Platelet Count: 187 10*3/uL (ref 150–400)
RBC: 4.9 MIL/uL (ref 4.22–5.81)
RDW: 20.1 % — ABNORMAL HIGH (ref 11.5–15.5)
WBC Count: 3.2 10*3/uL — ABNORMAL LOW (ref 4.0–10.5)
nRBC: 0 % (ref 0.0–0.2)

## 2022-06-29 MED ORDER — SODIUM CHLORIDE 0.9 % IV SOLN
300.0000 mg | Freq: Once | INTRAVENOUS | Status: AC
  Administered 2022-06-29: 300 mg via INTRAVENOUS
  Filled 2022-06-29: qty 30

## 2022-06-29 MED ORDER — SODIUM CHLORIDE 0.9 % IV SOLN: Freq: Once | INTRAVENOUS | Status: AC

## 2022-06-29 MED ORDER — FAMOTIDINE IN NACL 20-0.9 MG/50ML-% IV SOLN
20.0000 mg | Freq: Once | INTRAVENOUS | Status: AC
  Administered 2022-06-29: 20 mg via INTRAVENOUS
  Filled 2022-06-29: qty 50

## 2022-06-29 MED ORDER — SODIUM CHLORIDE 0.9 % IV SOLN
50.0000 mg/m2 | Freq: Once | INTRAVENOUS | Status: AC
  Administered 2022-06-29: 114 mg via INTRAVENOUS
  Filled 2022-06-29: qty 19

## 2022-06-29 MED ORDER — PALONOSETRON HCL INJECTION 0.25 MG/5ML
0.2500 mg | Freq: Once | INTRAVENOUS | Status: AC
  Administered 2022-06-29: 0.25 mg via INTRAVENOUS
  Filled 2022-06-29: qty 5

## 2022-06-29 MED ORDER — SODIUM CHLORIDE 0.9 % IV SOLN
10.0000 mg | Freq: Once | INTRAVENOUS | Status: AC
  Administered 2022-06-29: 10 mg via INTRAVENOUS
  Filled 2022-06-29: qty 10

## 2022-06-29 MED ORDER — DIPHENHYDRAMINE HCL 50 MG/ML IJ SOLN
50.0000 mg | Freq: Once | INTRAMUSCULAR | Status: AC
  Administered 2022-06-29: 50 mg via INTRAVENOUS
  Filled 2022-06-29: qty 1

## 2022-06-29 NOTE — Patient Instructions (Signed)
[facility]COL[facility]  Discharge Instructions: Thank you for choosing [facility] to provide your oncology and hematology care.   If you have a lab appointment with the Cancer Center, please go directly to the Cancer Center and check in at the registration area.   Wear comfortable clothing and clothing appropriate for easy access to any Portacath or PICC line.   We strive to give you quality time with your provider. You may need to reschedule your appointment if you arrive late (15 or more minutes).  Arriving late affects you and other patients whose appointments are after yours.  Also, if you miss three or more appointments without notifying the office, you may be dismissed from the clinic at the provider's discretion.      For prescription refill requests, have your pharmacy contact our office and allow 72 hours for refills to be completed.    Today you received the following chemotherapy and/or immunotherapy agents: Paclitaxel (Taxol) & Carboplatin     To help prevent nausea and vomiting after your treatment, we encourage you to take your nausea medication as directed.  BELOW ARE SYMPTOMS THAT SHOULD BE REPORTED IMMEDIATELY: *FEVER GREATER THAN 100.4 F (38 C) OR HIGHER *CHILLS OR SWEATING *NAUSEA AND VOMITING THAT IS NOT CONTROLLED WITH YOUR NAUSEA MEDICATION *UNUSUAL SHORTNESS OF BREATH *UNUSUAL BRUISING OR BLEEDING *URINARY PROBLEMS (pain or burning when urinating, or frequent urination) *BOWEL PROBLEMS (unusual diarrhea, constipation, pain near the anus) TENDERNESS IN MOUTH AND THROAT WITH OR WITHOUT PRESENCE OF ULCERS (sore throat, sores in mouth, or a toothache) UNUSUAL RASH, SWELLING OR PAIN  UNUSUAL VAGINAL DISCHARGE OR ITCHING   Items with * indicate a potential emergency and should be followed up as soon as possible or go to the Emergency Department if any problems should occur.  Please show the CHEMOTHERAPY ALERT CARD or IMMUNOTHERAPY  ALERT CARD at check-in to the Emergency Department and triage nurse.  Should you have questions after your visit or need to cancel or reschedule your appointment, please contact [facility]  Dept: [phone]  and follow the prompts.  Office hours are 8:00 a.m. to 4:30 p.m. Monday - Friday. Please note that voicemails left after 4:00 p.m. may not be returned until the following business day.  We are closed weekends and major holidays. You have access to a nurse at all times for urgent questions. Please call the main number to the clinic Dept: [phone] and follow the prompts.   For any non-urgent questions, you may also contact your provider using MyChart. We now offer e-Visits for anyone 18 and older to request care online for non-urgent symptoms. For details visit [email or web address].   Also download the MyChart app! Go to the app store, search "MyChart", open the app, select [facility], and log in with your MyChart username and password.  Masks are optional in the cancer centers. If you would like for your care team to wear a mask while they are taking care of you, please let them know. You may have one support person who is at least 53 years old accompany you for your appointments. 

## 2022-06-29 NOTE — Progress Notes (Signed)
Nutrition follow-up completed with patient infusion for esophageal cancer.  Today is patient's last chemotherapy treatment.  Weight was documented as 223 pounds 3.2 ounces decreased from 236 pounds on December 4.  This is a 6% weight loss in less than 1 month.  Labs were reviewed.  Patient reports he is experiencing increased painful swallowing.  He is drinking 1 Ensure Plus or equivalent daily.  Reports he now only eats when he is hungry because it hurts to swallow.  It is easier and less painful if he takes small bites or sips.  He denies other nutrition impact symptoms.  Nutrition diagnosis: Predicted sub-optimal energy intake has evolved into inadequate oral intake related to esophageal cancer and associated treatments as evidenced by 6% weight loss in less than 1 month.  Intervention: Recommended patient strive for weight maintenance. Increase oral nutrition supplements twice daily.  Provided samples and coupons. Reviewed ways to consume increased solids and liquids by alternating textures and temperatures of foods.  Monitoring, evaluation, goals: Patient will work to increase calories and protein to minimize further weight loss.  No follow-up is scheduled.  Patient has my contact information for questions or concerns.  **Disclaimer: This note was dictated with voice recognition software. Similar sounding words can inadvertently be transcribed and this note may contain transcription errors which may not have been corrected upon publication of note.**

## 2022-06-29 NOTE — Progress Notes (Signed)
[facility] Follow up:    Bruce Mc, MD [address] [place] [address]   DIAGNOSIS:  Cancer Staging  Esophageal cancer [facility]) Staging form: Esophagus - Adenocarcinoma, AJCC 8th Edition - Clinical stage from 12/16/2021: Stage I (cT1b, cN0, cM0, G2) - Signed by Truitt Merle, MD on 04/27/2022 Stage prefix: Initial diagnosis Total positive nodes: 0 Histologic grading system: 3 grade system Type of surgical resection with curative intent: Endoscopic mucosal resection   SUMMARY OF ONCOLOGIC HISTORY: Oncology History  Esophageal cancer ([place])  12/16/2021 Procedure   Upper GI endoscopy, Dr. Bary Castilla  Impression: - LA Grade C reflux esophagitis with no bleeding. Rule out Barrett's esophagus. Biopsied. - Small hiatal hernia. - Normal examined duodenum.   12/16/2021 Initial Biopsy   DIAGNOSIS:  A. ESOPHAGUS, 40 CM; COLD BIOPSY:  - SQUAMOCOLUMNAR MUCOSA WITH INTESTINAL METAPLASIA, COMPATIBLE WITH HISTORY OF BARRETT'S ESOPHAGUS, WITH FOCAL LOW GRADE DYSPLASIA.  - NEGATIVE FOR HIGH-GRADE DYSPLASIA AND MALIGNANCY.   B. ESOPHAGUS, 38 CM; BIOPSY:  - SQUAMOCOLUMNAR MUCOSA WITH INTESTINAL METAPLASIA, COMPATIBLE WITH HISTORY OF BARRETT'S ESOPHAGUS, WITH FOCAL LOW GRADE DYSPLASIA.  - NEGATIVE FOR HIGH-GRADE DYSPLASIA AND MALIGNANCY.   C. ESOPHAGUS, 36 CM; COLD BIOPSY:  - INVASIVE MODERATELY DIFFERENTIATED ADENOCARCINOMA, ARISING IN A BACKGROUND OF BARRETT'S ESOPHAGUS WITH HIGH-GRADE DYSPLASIA.   D. COLON, CECUM; COLD BIOPSY:  - CHRONIC COLITIS WITH MODERATE ACTIVITY (CRYPTITIS AND CRYPT ABSCESSES).  - NEGATIVE FOR GRANULOMA, DYSPLASIA, AND MALIGNANCY.   E. COLON, DESCENDING; COLD BIOPSY:  - CHRONIC COLITIS WITH MODERATE ACTIVITY (CRYPTITIS AND CRYPT ABSCESSES). - NEGATIVE FOR GRANULOMA, DYSPLASIA, AND MALIGNANCY.   F. RECTUM; COLD BIOPSY:  - CHRONIC PROCTITIS WITH MODERATE ACTIVITY (CRYPTITIS AND CRYPT ABSCESSES).  - NEGATIVE FOR GRANULOMA,  DYSPLASIA, AND MALIGNANCY.   Comment:  Biopsy sections taken from the cecum, descending colon, and rectum display crypt architectural abnormalities (crypt branching and bifid crypts), basal lymphoplasmacytosis, and active intramucosal inflammation ranging from cryptitis to crypt abscesses.  The features are highly suggestive of an evolving inflammatory bowel disease.  Clinical and endoscopic correlation is recommended.    12/16/2021 Cancer Staging   Staging form: Esophagus - Adenocarcinoma, AJCC 8th Edition - Clinical stage from 12/16/2021: Stage I (cT1b, cN0, cM0, G2) - Signed by Truitt Merle, MD on 04/27/2022 Stage prefix: Initial diagnosis Total positive nodes: 0 Histologic grading system: 3 grade system Type of surgical resection with curative intent: Endoscopic mucosal resection   12/17/2021 Initial Diagnosis   Esophageal cancer ([place])   12/25/2021 Imaging   EXAM: CT CHEST WITH CONTRAST  IMPRESSION: 1. Moderate wall thickening involving the distal esophagus near the GE junction but no paraesophageal or upper abdominal lymphadenopathy. 2. No findings for pulmonary metastatic disease. 3. Age advanced atherosclerotic calcifications involving the coronary arteries. 4. Advanced emphysematous changes with paraseptal bulla.   Aortic Atherosclerosis (ICD10-I70.0) and Emphysema (ICD10-J43.9).   01/04/2022 Procedure   Upper EUS, Dr. Mont Dutton  EGD Impressions: - Esophageal mucosal changes consistent with long-segment Barrett's esophagus. - Cratered esophageal ulcer at 40 cm (up to 8 mm in largest dimension). Biopsied. Suspect this is the site of previous biopsy proven adenocarcinoma. - Normal stomach. - Normal examined duodenum.   EUS Impressions: - There was no sign of significant pathology in the esophagus. No obvious esophageal mass was seen. Unable to T stage due to no mass lesion seen. - No periesophageal or perigastric lymph nodes visualized. - Normal visualized portions of the  liver. - Normal celiac region.   01/04/2022 Pathology Results  DIAGNOSIS    A. Esophagus, 40 cm, esophageal ulcer, endoscopic biopsy:   Barrett esophagus with high-grade dysplasia. (See comment)   Cytomegalovirus esophagitis.    Comment: A p53 immunostain demonstrates loss of p53 expression within the atypical gastric mucosa, consistent with TP53 null mutant, supporting a diagnosis of high-grade dysplasia. CMV immunohistochemistry highlights rare positive cells, corroborated by viral cytopathic effect on H&E. PASD stain is negative for fungal organisms. Multiple step sections are examined.        01/28/2022 PET scan   FDG PET SBMT W NONDIAGNOSTIC CONCURRENT CT INITIAL   IMPRESSION:  1.  No evidence of FDG avid disease.   2.  Gluteal cleft findings favored to infectious/inflammatory, correlate with physical exam.    03/15/2022 Pathology Results   DIAGNOSIS    A. Esophagus, 36 cm, endomucosal resection:   Invasive, moderately differentiated adenocarcinoma. Tumor is seen throughout a thickened muscularis mucosal layer. Only a small amount of submucosa is present, best seen in block A2, and here tumor is present within the submucosa. Cauterized tumor is present at the deep margin (orange ink) and a peripheral margin (blue ink). Negative for lymphovascular and perineural invasion. See summary in synoptic report.   B. Esophagus, 36 cm #2, endomucosal resection:   Invasive, moderately-differentiated adenocarcinoma. Tumor is seen throughout a thickened muscularis mucosal layer. A good representation of submucosal tissue is also present, with small areas of tumor seen in submucosal tissue and focally at the deep (orange ink) margin. Tumor is also present at a peripheral margin (blue ink). Negative for lymphovascular and perineural invasion. See summary in synoptic report.   C. Esophagus, 36 cm #3, endomucosal resection:   Invasive moderately differentiated adenocarcinoma. Tumor is  focally seen in a thickened muscularis mucosal layer. A mucous gland is present but no submucosal tissue is present in this small specimen. Cauterized tumor is present at orange ink (the margin of this small specimen is entirely inked orange). Negative for lymphovascular and perineural invasion. See summary in synoptic report.   D. Esophagus, 39 cm, biopsy:   Gastric type mucosa. No squamous mucosa is seen. Negative for intestinal metaplasia.  Negative for dysplasia.   E. Esophagus, 37 cm, biopsy:    Focal area concerning for high grade dysplasia is seen in a background of squamocolumnar junctional mucosa with goblet cell intestinal metaplasia (Barrett's esophagus).  The focal area is best seen on level 1, deeper levels 3-5 do not demonstrate the concerning area. Negative for malignancy.   Comment:  See the synoptic report for summary.  While I very much suspect the T-stage is pT1b (submucosa), this is technically considered indefinite in the presence of positive deep margins. MSI testing has been requested on block B2 of this case; results will be issued separately.     05/24/2022 -  Chemotherapy   Patient is on Treatment Plan : ESOPHAGUS Carboplatin + Paclitaxel Weekly X 6 Weeks with XRT       CURRENT THERAPY: chemoradiation  INTERVAL HISTORY: Bruce Graham 52 y.o. male returns for follow-up prior to receiving his sixth and final cycle of weekly concurrent chemotherapy.  He tells me he is tolerating this well.  He denies any peripheral neuropathy.  He says that he is swallowing without difficulty and has to be careful to chew food thoroughly and take smaller bites.     Patient Active Problem List   Diagnosis Date Noted   Hiccups 06/07/2022   S/P robot-assisted surgical procedure 05/18/2022   Insect bite of left hand 05/13/2022  Hospital discharge follow-up 05/13/2022   Pneumothorax 04/28/2022   Leukocytosis 04/28/2022   COPD (chronic obstructive pulmonary disease) ([place])  04/28/2022   Esophageal cancer ([place]) 12/17/2021   Hypokalemia 12/10/2021   IBD (inflammatory bowel disease) 12/08/2021   Viral conjunctivitis 12/04/2021   Myalgia due to statin 11/13/2021   Agatston coronary artery calcium score greater than 400 11/13/2021   Intervertebral cervical disc disorder with myelopathy, 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/13/2021   No-show for appointment 09/09/2021   History of leukocytosis 09/09/2021   Tobacco abuse 02/17/2021   Family history of heart disease 02/17/2021   Essential hypertension 02/08/2021   Family history of colon cancer    Hidradenitis suppurativa 08/07/2015   GERD (gastroesophageal reflux disease) 06/16/2015    has No Known Allergies.  MEDICAL HISTORY: Past Medical History:  Diagnosis Date   Abscess of right thigh 06/16/2015   Barrett esophagus    Bronchitis    Complication of anesthesia    hard to wake up after hernia surgery   Ear infection 12/13/2017   taking amoxicillin   Family history of adverse reaction to anesthesia    mom hard to wake up    GERD (gastroesophageal reflux disease)    HLD (hyperlipidemia)    Hydradenitis    Left leg   Hypertension    Intervertebral disc disorder    Myalgia    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/07/2015   Suppurative hidradenitis    Umbilical hernia without obstruction and without gangrene 11/22/2017    SURGICAL HISTORY: Past Surgical History:  Procedure Laterality Date   COLONOSCOPY WITH PROPOFOL N/A 03/06/2018   Procedure: COLONOSCOPY WITH PROPOFOL;  Surgeon: Lin Landsman, MD;  Location: [facility] ENDOSCOPY;  Service: Gastroenterology;  Laterality: N/A;   COLONOSCOPY WITH PROPOFOL N/A 12/16/2021   Procedure: COLONOSCOPY WITH PROPOFOL;  Surgeon: Robert Bellow, MD;  Location: [facility] ENDOSCOPY;  Service: Endoscopy;  Laterality: N/A;   ESOPHAGOGASTRODUODENOSCOPY (EGD) WITH PROPOFOL N/A 03/06/2018   Procedure: ESOPHAGOGASTRODUODENOSCOPY (EGD) WITH PROPOFOL;  Surgeon: Lin Landsman, MD;  Location:  [facility] ENDOSCOPY;  Service: Gastroenterology;  Laterality: N/A;   ESOPHAGOGASTRODUODENOSCOPY (EGD) WITH PROPOFOL N/A 12/16/2021   Procedure: ESOPHAGOGASTRODUODENOSCOPY (EGD) WITH PROPOFOL;  Surgeon: Robert Bellow, MD;  Location: [facility] ENDOSCOPY;  Service: Endoscopy;  Laterality: N/A;   HERNIA REPAIR     INCISION AND DRAINAGE ABSCESS N/A 05/02/2020   Procedure: INCISION AND DRAINAGE ABSCESS;  Surgeon: Robert Bellow, MD;  Location: [facility] ORS;  Service: General;  Laterality: N/A;   INCISION AND DRAINAGE PERIRECTAL ABSCESS Right 06/16/2015   Procedure: IRRIGATION AND DEBRIDEMENT right inner thigh ABSCESS;  Surgeon: Florene Glen, MD;  Location: [facility] ORS;  Service: General;  Laterality: Right;   INSERTION OF MESH N/A 12/20/2017   Procedure: INSERTION OF MESH;  Surgeon: [name], MD;  Location: [facility] ORS;  Service: General;  Laterality: N/A;   KNEE ARTHROSCOPY WITH ANTERIOR CRUCIATE LIGAMENT (ACL) REPAIR Left 2002   KNEE SURGERY Left    PILONIDAL CYST EXCISION N/A 05/02/2020   Procedure: CYST EXCISION PILONIDAL EXTENSIVE;  Surgeon: Robert Bellow, MD;  Location: [facility] ORS;  Service: General;  Laterality: N/A;   UMBILICAL HERNIA REPAIR N/A 12/20/2017   Procedure: HERNIA REPAIR UMBILICAL ADULT;  Surgeon: [name], MD;  Location: [facility] ORS;  Service: General;  Laterality: N/A;   VIDEO ASSISTED THORACOSCOPY (VATS)/WEDGE RESECTION Right 04/29/2022   Procedure: RIGHT VIDEO ASSISTED THORACOSCOPY (VATS)/ STAPELING OF BLEBS;  Surgeon: [name], MD;  Location: [facility];  Service: Thoracic;  Laterality: Right;  SOCIAL HISTORY: Social History   Socioeconomic History   Marital status: Single    Spouse name: Not on file   Number of children: Not on file   Years of education: Not on file   Highest education level: Not on file  Occupational History   Not on file  Tobacco Use   Smoking status: Former    Packs/day: 2.00    Years: 30.00    Total pack years: 60.00     Types: Cigarettes   Smokeless tobacco: Never  Vaping Use   Vaping Use: Never used  Substance and Sexual Activity   Alcohol use: Not Currently    Comment: social   Drug use: No   Sexual activity: Not Currently  Other Topics Concern   Not on file  Social History Narrative   Not on file   Social Determinants of Health   Financial Resource Strain: Not on file  Food Insecurity: No Food Insecurity (04/28/2022)   Hunger Vital Sign    Worried About Running Out of Food in the Last Year: Never true    Ran Out of Food in the Last Year: Never true  Transportation Needs: No Transportation Needs (04/28/2022)   PRAPARE - Hydrologist (Medical): No    Lack of Transportation (Non-Medical): No  Physical Activity: Not on file  Stress: Not on file  Social Connections: Not on file  Intimate Partner Violence: Not At Risk (04/28/2022)   Humiliation, Afraid, Rape, and Kick questionnaire    Fear of Current or Ex-Partner: No    Emotionally Abused: No    Physically Abused: No    Sexually Abused: No    FAMILY HISTORY: Family History  Problem Relation Age of Onset   Heart disease Father 31   Cancer Father 71       Lung Cancer   Cancer Paternal Grandfather 44       in the bones    Review of Systems  Constitutional:  Negative for appetite change, chills, fatigue, fever and unexpected weight change.  HENT:   Negative for hearing loss, lump/mass and trouble swallowing.   Eyes:  Negative for eye problems and icterus.  Respiratory:  Negative for chest tightness, cough and shortness of breath.   Cardiovascular:  Negative for chest pain, leg swelling and palpitations.  Gastrointestinal:  Negative for abdominal distention, abdominal pain, constipation, diarrhea, nausea and vomiting.  Endocrine: Negative for hot flashes.  Genitourinary:  Negative for difficulty urinating.   Musculoskeletal:  Negative for arthralgias.  Skin:  Negative for itching and rash.  Neurological:   Negative for dizziness, extremity weakness, headaches and numbness.  Hematological:  Negative for adenopathy. Does not bruise/bleed easily.  Psychiatric/Behavioral:  Negative for depression. The patient is not nervous/anxious.       PHYSICAL EXAMINATION  ECOG PERFORMANCE STATUS: 1 - Symptomatic but completely ambulatory  Vitals:   06/29/22 0855  BP: 109/68  Pulse: 87  Resp: 18  Temp: (!) 97.5 F (36.4 C)  SpO2: 100%    Physical Exam Constitutional:      General: He is not in acute distress.    Appearance: Normal appearance. He is not toxic-appearing.  HENT:     Head: Normocephalic and atraumatic.  Eyes:     General: No scleral icterus. Cardiovascular:     Rate and Rhythm: Normal rate and regular rhythm.     Pulses: Normal pulses.     Heart sounds: Normal heart sounds.  Pulmonary:     Effort:  Pulmonary effort is normal.     Breath sounds: Normal breath sounds.  Abdominal:     General: Abdomen is flat. Bowel sounds are normal. There is no distension.     Palpations: Abdomen is soft.     Tenderness: There is no abdominal tenderness.  Musculoskeletal:        General: No swelling.     Cervical back: Neck supple.  Lymphadenopathy:     Cervical: No cervical adenopathy.  Skin:    General: Skin is warm and dry.     Findings: No rash.  Neurological:     General: No focal deficit present.     Mental Status: He is alert.  Psychiatric:        Mood and Affect: Mood normal.        Behavior: Behavior normal.     LABORATORY DATA:  CBC    Component Value Date/Time   WBC 3.2 (L) 06/29/2022 0811   WBC 10.4 05/13/2022 1148   RBC 4.90 06/29/2022 0811   HGB 13.7 06/29/2022 0811   HGB 13.2 12/04/2021 1545   HCT 41.4 06/29/2022 0811   HCT 38.5 12/04/2021 1545   PLT 187 06/29/2022 0811   PLT 460 (H) 12/04/2021 1545   MCV 84.5 06/29/2022 0811   MCV 80 12/04/2021 1545   MCH 28.0 06/29/2022 0811   MCHC 33.1 06/29/2022 0811   RDW 20.1 (H) 06/29/2022 0811   RDW 13.6  12/04/2021 1545   LYMPHSABS 0.4 (L) 06/29/2022 0811   LYMPHSABS 3.3 (H) 12/04/2021 1545   MONOABS 0.8 06/29/2022 0811   EOSABS 0.0 06/29/2022 0811   EOSABS 0.2 12/04/2021 1545   BASOSABS 0.0 06/29/2022 0811   BASOSABS 0.1 12/04/2021 1545    CMP     Component Value Date/Time   NA 135 06/29/2022 0811   NA CANCELED 12/04/2021 1545   K 4.3 06/29/2022 0811   CL 105 06/29/2022 0811   CO2 25 06/29/2022 0811   GLUCOSE 88 06/29/2022 0811   BUN 7 06/29/2022 0811   BUN CANCELED 12/04/2021 1545   CREATININE 0.77 06/29/2022 0811   CALCIUM 9.3 06/29/2022 0811   PROT 7.5 06/29/2022 0811   ALBUMIN 3.8 06/29/2022 0811   AST 27 06/29/2022 0811   ALT 21 06/29/2022 0811   ALKPHOS 67 06/29/2022 0811   BILITOT 0.4 06/29/2022 0811   GFRNONAA >60 06/29/2022 0811   GFRAA >60 01/13/2018 1937       ASSESSMENT and THERAPY PLAN:   Esophageal cancer ([place]) Montae is a 52 year old male with stage I esophageal cancer diagnosed in June 2023 status postresection in September 2023 followed by concurrent chemoradiation which began November 27.  Arnett is doing well today.  He will proceed with his sixth and final cycle of weekly CarboTaxol.  He is tolerating this well and has no significant side effects from this.  He does have some mild dysphagia we discussed swallowing and he sees Barb in nutrition today during his treatment.  He will complete his chemoradiation on July 02, 2022.  He has labs and follow-up with Dr. Burr Medico in February 2024.   All questions were answered. The patient knows to call the clinic with any problems, questions or concerns. We can certainly see the patient much sooner if necessary.  Total encounter time:20 minutes*in face-to-face visit time, chart review, lab review, care coordination, order entry, and documentation of the encounter time.   Wilber Bihari, NP 06/29/22 9:18 AM Medical Oncology and Hematology [facility] [place]  [place], [place]  [address] Tel. [phone]    Fax. [phone]  *Total Encounter Time as defined by the Centers for Medicare and Medicaid Services includes, in addition to the face-to-face time of a patient visit (documented in the note above) non-face-to-face time: obtaining and reviewing outside history, ordering and reviewing medications, tests or procedures, care coordination (communications with other health care professionals or caregivers) and documentation in the medical record.

## 2022-06-29 NOTE — Assessment & Plan Note (Signed)
Bruce Graham is a 52 year old male with stage I esophageal cancer diagnosed in June 2023 status postresection in September 2023 followed by concurrent chemoradiation which began November 27.  Bruce Graham is doing well today.  He will proceed with his sixth and final cycle of weekly CarboTaxol.  He is tolerating this well and has no significant side effects from this.  He does have some mild dysphagia we discussed swallowing and he sees Barb in nutrition today during his treatment.  He will complete his chemoradiation on July 02, 2022.  He has labs and follow-up with Dr. Burr Medico in February 2024.

## 2022-06-30 ENCOUNTER — Ambulatory Visit
Admission: RE | Admit: 2022-06-30 | Discharge: 2022-06-30 | Disposition: A | Discharge status: Home / Self Care | Payer: BC Managed Care – PPO | Source: Ambulatory Visit | Attending: Radiation Oncology | Admitting: Radiation Oncology

## 2022-06-30 ENCOUNTER — Other Ambulatory Visit: Payer: Self-pay

## 2022-06-30 DIAGNOSIS — C155 Malignant neoplasm of lower third of esophagus: Secondary | ICD-10-CM | POA: Diagnosis not present

## 2022-06-30 DIAGNOSIS — Z510 Encounter for antineoplastic radiation therapy: Secondary | ICD-10-CM | POA: Diagnosis not present

## 2022-06-30 LAB — RAD ONC ARIA SESSION SUMMARY
Course Elapsed Days: 37
Plan Fractions Treated to Date: 1
Plan Prescribed Dose Per Fraction: 1.8 Gy
Plan Total Fractions Prescribed: 3
Plan Total Prescribed Dose: 5.4 Gy
Reference Point Dosage Given to Date: 1.8 Gy
Reference Point Session Dosage Given: 1.8 Gy
Session Number: 26

## 2022-07-01 ENCOUNTER — Other Ambulatory Visit: Payer: Self-pay

## 2022-07-01 ENCOUNTER — Ambulatory Visit
Admission: RE | Admit: 2022-07-01 | Discharge: 2022-07-01 | Disposition: A | Discharge status: Home / Self Care | Payer: BC Managed Care – PPO | Source: Ambulatory Visit | Attending: Radiation Oncology | Admitting: Radiation Oncology

## 2022-07-01 DIAGNOSIS — Z510 Encounter for antineoplastic radiation therapy: Secondary | ICD-10-CM | POA: Diagnosis not present

## 2022-07-01 DIAGNOSIS — C155 Malignant neoplasm of lower third of esophagus: Secondary | ICD-10-CM | POA: Diagnosis not present

## 2022-07-01 LAB — RAD ONC ARIA SESSION SUMMARY
Course Elapsed Days: 38
Plan Fractions Treated to Date: 2
Plan Prescribed Dose Per Fraction: 1.8 Gy
Plan Total Fractions Prescribed: 3
Plan Total Prescribed Dose: 5.4 Gy
Reference Point Dosage Given to Date: 3.6 Gy
Reference Point Session Dosage Given: 1.8 Gy
Session Number: 27

## 2022-07-02 ENCOUNTER — Ambulatory Visit
Admission: RE | Admit: 2022-07-02 | Discharge: 2022-07-02 | Disposition: A | Discharge status: Home / Self Care | Payer: BC Managed Care – PPO | Source: Ambulatory Visit | Attending: Radiation Oncology | Admitting: Radiation Oncology

## 2022-07-02 ENCOUNTER — Other Ambulatory Visit: Payer: Self-pay

## 2022-07-02 ENCOUNTER — Encounter: Payer: Self-pay | Admitting: Radiation Oncology

## 2022-07-02 DIAGNOSIS — C155 Malignant neoplasm of lower third of esophagus: Secondary | ICD-10-CM | POA: Diagnosis not present

## 2022-07-02 DIAGNOSIS — Z510 Encounter for antineoplastic radiation therapy: Secondary | ICD-10-CM | POA: Diagnosis not present

## 2022-07-02 LAB — RAD ONC ARIA SESSION SUMMARY
Course Elapsed Days: 39
Plan Fractions Treated to Date: 3
Plan Prescribed Dose Per Fraction: 1.8 Gy
Plan Total Fractions Prescribed: 3
Plan Total Prescribed Dose: 5.4 Gy
Reference Point Dosage Given to Date: 5.4 Gy
Reference Point Session Dosage Given: 1.8 Gy
Session Number: 28

## 2022-07-05 ENCOUNTER — Telehealth: Payer: Self-pay | Admitting: *Deleted

## 2022-07-05 NOTE — Telephone Encounter (Signed)
Voicemail retrieved from [facility], Nov 22, [phone]).  "Employer hasn't received FMLA paperwork.  Checking to see where it may be."      Form returned to form staff, signed by provider 07/01/2022.  Returned at this time to [employer] via fax and email.  No further actions performed by this nurse.

## 2022-07-06 ENCOUNTER — Encounter: Payer: Self-pay | Admitting: Hematology

## 2022-07-06 NOTE — Telephone Encounter (Signed)
Return call received from [facility] reporting FMLA not received by employer.  Advised returned last evening with successful notice for fax and e-mail.  Asked if the 06/14/2022 "Addendum request" is needed.  Not completed by other form staff.  Santos Sollenberger will ask employer and call back.

## 2022-07-06 NOTE — Progress Notes (Signed)
                                                                                                                                                             Patient Name: Bruce Graham MRN: [id number] DOB: 04/25/1971 Referring Physician: Truitt Merle (Profile Not Attached) Date of Service: 07/02/2022 [facility], [place]                                                        End Of Treatment Note  Diagnoses: C15.5-Malignant neoplasm of lower third of esophagus  Cancer Staging:  Stage I, cT1N0M0, adenocarcinoma of the distal esophagus.   Intent: Curative  Radiation Treatment Dates: 05/24/2022 through 07/02/2022 Site Technique Total Dose (Gy) Dose per Fx (Gy) Completed Fx Beam Energies  Esophagus: Esoph IMRT 45/45 1.8 25/25 6X  Esophagus: Esoph_Bst IMRT 5.4/5.4 1.8 3/3 6X   Narrative: The patient tolerated radiation therapy relatively well. He noted burping and some symptoms of esophagitis.  Plan: The patient will receive a call in about one month from the radiation oncology department. He will continue follow up with Dr. Burr Medico as well.   ________________________________________________    Carola Rhine, [facility]

## 2022-07-06 NOTE — Telephone Encounter (Signed)
Returned call to [facility] [phone]) as requested 07/05/2021.  Message left informing patient this nurse returned LOA form last evening.  Asked what means to return form for his records.  Provided direct this nurse direct phone extension.

## 2022-07-09 ENCOUNTER — Telehealth: Payer: Self-pay

## 2022-07-09 NOTE — Telephone Encounter (Signed)
Patient notified of completion of FMLA Forms. Fax transmission confirmation received. Patient confirmed receipt of Forms by Employer. Copy of Form mailed to patient as requested. No other needs or concerns voiced at this time.

## 2022-07-12 ENCOUNTER — Telehealth: Payer: Self-pay

## 2022-07-12 NOTE — Telephone Encounter (Signed)
Patient called in complaining of gas, indigestion, belching, and burping, having a problem eating due to symptoms. Patient has tried gasx  over the counter and his Zofran and compazine with no relief. Wanted to know if Dr. Burr Medico could call in something to help with symptoms.

## 2022-07-13 ENCOUNTER — Telehealth: Payer: Self-pay

## 2022-07-13 ENCOUNTER — Other Ambulatory Visit: Payer: Self-pay

## 2022-07-13 DIAGNOSIS — C155 Malignant neoplasm of lower third of esophagus: Secondary | ICD-10-CM

## 2022-07-13 DIAGNOSIS — C159 Malignant neoplasm of esophagus, unspecified: Secondary | ICD-10-CM

## 2022-07-13 NOTE — Telephone Encounter (Signed)
RN attempted to contact patient regarding [facility] appointment for tomorrow. No answer and voicemail full.

## 2022-07-13 NOTE — Progress Notes (Signed)
Symptom Management Consult note [place]    Patient Care Team: Crecencio Mc, MD as PCP - General (Internal Medicine) Bary Castilla [name], MD as Consulting Physician (General Surgery) Haig Prophet Hilton Cork, MD as Consulting Physician (Gastroenterology) Lerry Paterson, MD as Consulting Physician (Thoracic Surgery) Truitt Merle, MD as Consulting Physician (Hematology)    Name of the patient: Bruce Graham  [id number]  [birth year]   Date of visit: 07/14/2022   Chief Complaint/Reason for visit: nausea and indigestion   Current Therapy: carboplatin and paclitaxel and radiation  Last treatment:  Day 36   Cycle 1 on 06/29/22   ASSESSMENT & PLAN: Patient is a 53 y.o. male  with oncologic history of stage 1 esophageal cancer followed by Dr. Burr Medico.  I have viewed most recent oncology note and lab work.    #)Stage 1 esophageal cancer - Recently finished treatment: last chemo 01/02 and radiation 01/05 - Next appointment with oncologist is 08/02/22.   #)Symptom management: nausea, indigestion -CBC and CMP overall unremarkable.  -Symptoms likely related to radiation based on HPI. Will attempt symptom management with PO Protonix and Carafate. He has zofran at home for PRN use for the nausea.  -Patient declined need for IVF today which is reasonable as he is tolerating PO intake.      Heme/Onc History: Oncology History  Esophageal cancer ([place])  12/16/2021 Procedure   Upper GI endoscopy, Dr. Bary Castilla  Impression: - LA Grade C reflux esophagitis with no bleeding. Rule out Barrett's esophagus. Biopsied. - Small hiatal hernia. - Normal examined duodenum.   12/16/2021 Initial Biopsy   DIAGNOSIS:  A. ESOPHAGUS, 40 CM; COLD BIOPSY:  - SQUAMOCOLUMNAR MUCOSA WITH INTESTINAL METAPLASIA, COMPATIBLE WITH HISTORY OF BARRETT'S ESOPHAGUS, WITH FOCAL LOW GRADE DYSPLASIA.  - NEGATIVE FOR HIGH-GRADE DYSPLASIA AND MALIGNANCY.   B. ESOPHAGUS, 38 CM; BIOPSY:  - SQUAMOCOLUMNAR  MUCOSA WITH INTESTINAL METAPLASIA, COMPATIBLE WITH HISTORY OF BARRETT'S ESOPHAGUS, WITH FOCAL LOW GRADE DYSPLASIA.  - NEGATIVE FOR HIGH-GRADE DYSPLASIA AND MALIGNANCY.   C. ESOPHAGUS, 36 CM; COLD BIOPSY:  - INVASIVE MODERATELY DIFFERENTIATED ADENOCARCINOMA, ARISING IN A BACKGROUND OF BARRETT'S ESOPHAGUS WITH HIGH-GRADE DYSPLASIA.   D. COLON, CECUM; COLD BIOPSY:  - CHRONIC COLITIS WITH MODERATE ACTIVITY (CRYPTITIS AND CRYPT ABSCESSES).  - NEGATIVE FOR GRANULOMA, DYSPLASIA, AND MALIGNANCY.   E. COLON, DESCENDING; COLD BIOPSY:  - CHRONIC COLITIS WITH MODERATE ACTIVITY (CRYPTITIS AND CRYPT ABSCESSES). - NEGATIVE FOR GRANULOMA, DYSPLASIA, AND MALIGNANCY.   F. RECTUM; COLD BIOPSY:  - CHRONIC PROCTITIS WITH MODERATE ACTIVITY (CRYPTITIS AND CRYPT ABSCESSES).  - NEGATIVE FOR GRANULOMA, DYSPLASIA, AND MALIGNANCY.   Comment:  Biopsy sections taken from the cecum, descending colon, and rectum display crypt architectural abnormalities (crypt branching and bifid crypts), basal lymphoplasmacytosis, and active intramucosal inflammation ranging from cryptitis to crypt abscesses.  The features are highly suggestive of an evolving inflammatory bowel disease.  Clinical and endoscopic correlation is recommended.    12/16/2021 Cancer Staging   Staging form: Esophagus - Adenocarcinoma, AJCC 8th Edition - Clinical stage from 12/16/2021: Stage I (cT1b, cN0, cM0, G2) - Signed by Truitt Merle, MD on 04/27/2022 Stage prefix: Initial diagnosis Total positive nodes: 0 Histologic grading system: 3 grade system Type of surgical resection with curative intent: Endoscopic mucosal resection   12/17/2021 Initial Diagnosis   Esophageal cancer ([place])   12/25/2021 Imaging   EXAM: CT CHEST WITH CONTRAST  IMPRESSION: 1. Moderate wall thickening involving the distal esophagus near the GE junction but no paraesophageal or upper abdominal lymphadenopathy.  2. No findings for pulmonary metastatic disease. 3. Age advanced  atherosclerotic calcifications involving the coronary arteries. 4. Advanced emphysematous changes with paraseptal bulla.   Aortic Atherosclerosis (ICD10-I70.0) and Emphysema (ICD10-J43.9).   01/04/2022 Procedure   Upper EUS, Dr. Mont Dutton  EGD Impressions: - Esophageal mucosal changes consistent with long-segment Barrett's esophagus. - Cratered esophageal ulcer at 40 cm (up to 8 mm in largest dimension). Biopsied. Suspect this is the site of previous biopsy proven adenocarcinoma. - Normal stomach. - Normal examined duodenum.   EUS Impressions: - There was no sign of significant pathology in the esophagus. No obvious esophageal mass was seen. Unable to T stage due to no mass lesion seen. - No periesophageal or perigastric lymph nodes visualized. - Normal visualized portions of the liver. - Normal celiac region.   01/04/2022 Pathology Results   DIAGNOSIS    A. Esophagus, 40 cm, esophageal ulcer, endoscopic biopsy:   Barrett esophagus with high-grade dysplasia. (See comment)   Cytomegalovirus esophagitis.    Comment: A p53 immunostain demonstrates loss of p53 expression within the atypical gastric mucosa, consistent with TP53 null mutant, supporting a diagnosis of high-grade dysplasia. CMV immunohistochemistry highlights rare positive cells, corroborated by viral cytopathic effect on H&E. PASD stain is negative for fungal organisms. Multiple step sections are examined.        01/28/2022 PET scan   FDG PET SBMT W NONDIAGNOSTIC CONCURRENT CT INITIAL   IMPRESSION:  1.  No evidence of FDG avid disease.   2.  Gluteal cleft findings favored to infectious/inflammatory, correlate with physical exam.    03/15/2022 Pathology Results   DIAGNOSIS    A. Esophagus, 36 cm, endomucosal resection:   Invasive, moderately differentiated adenocarcinoma. Tumor is seen throughout a thickened muscularis mucosal layer. Only a small amount of submucosa is present, best seen in block A2, and here tumor  is present within the submucosa. Cauterized tumor is present at the deep margin (orange ink) and a peripheral margin (blue ink). Negative for lymphovascular and perineural invasion. See summary in synoptic report.   B. Esophagus, 36 cm #2, endomucosal resection:   Invasive, moderately-differentiated adenocarcinoma. Tumor is seen throughout a thickened muscularis mucosal layer. A good representation of submucosal tissue is also present, with small areas of tumor seen in submucosal tissue and focally at the deep (orange ink) margin. Tumor is also present at a peripheral margin (blue ink). Negative for lymphovascular and perineural invasion. See summary in synoptic report.   C. Esophagus, 36 cm #3, endomucosal resection:   Invasive moderately differentiated adenocarcinoma. Tumor is focally seen in a thickened muscularis mucosal layer. A mucous gland is present but no submucosal tissue is present in this small specimen. Cauterized tumor is present at orange ink (the margin of this small specimen is entirely inked orange). Negative for lymphovascular and perineural invasion. See summary in synoptic report.   D. Esophagus, 39 cm, biopsy:   Gastric type mucosa. No squamous mucosa is seen. Negative for intestinal metaplasia.  Negative for dysplasia.   E. Esophagus, 37 cm, biopsy:    Focal area concerning for high grade dysplasia is seen in a background of squamocolumnar junctional mucosa with goblet cell intestinal metaplasia (Barrett's esophagus).  The focal area is best seen on level 1, deeper levels 3-5 do not demonstrate the concerning area. Negative for malignancy.   Comment:  See the synoptic report for summary.  While I very much suspect the T-stage is pT1b (submucosa), this is technically considered indefinite in the presence of positive deep margins.  MSI testing has been requested on block B2 of this case; results will be issued separately.     05/24/2022 -  Chemotherapy    Patient is on Treatment Plan : ESOPHAGUS Carboplatin + Paclitaxel Weekly X 6 Weeks with XRT         Interval history-: Treg Diemer is a 52 y.o. male with oncologic history as above presenting to [facility] today with chief complaint of nausea and indigestion x 1 week. Patient presents unaccompanied to clinic today.  Patient reports the week after completing radiation was rough for him. He had discomfort in his throat when swallowing and gas causing him to belch frequently. He reports it was not painful, just uncomfortable. Because of the discomfort he was eating less which made him feel nauseous. Patient admits that during his final 3 radiation treatments he "received targeted therapy to where his cancer was" which is where he is now feeling the discomfort. He tried to decrease his symptoms by drinking Sprite and taking Gas-X however did not feel like he had much relief.  Patient states his symptoms started to improve yesterday.  He was able to tolerate eating half of a cheeseburger and this morning ate the majority of a bologna and egg biscuit. He also has been continuing to drink boost shakes for nutrition. He denies any fever or chills.      ROS  All other systems are reviewed and are negative for acute change except as noted in the HPI.    No Known Allergies   Past Medical History:  Diagnosis Date   Abscess of right thigh 06/16/2015   Barrett esophagus    Bronchitis    Complication of anesthesia    hard to wake up after hernia surgery   Ear infection 12/13/2017   taking amoxicillin   Family history of adverse reaction to anesthesia    mom hard to wake up    GERD (gastroesophageal reflux disease)    HLD (hyperlipidemia)    Hydradenitis    Left leg   Hypertension    Intervertebral disc disorder    Myalgia    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/07/2015   Suppurative hidradenitis    Umbilical hernia without obstruction and without gangrene 11/22/2017     Past Surgical History:   Procedure Laterality Date   COLONOSCOPY WITH PROPOFOL N/A 03/06/2018   Procedure: COLONOSCOPY WITH PROPOFOL;  Surgeon: Lin Landsman, MD;  Location: [facility] ENDOSCOPY;  Service: Gastroenterology;  Laterality: N/A;   COLONOSCOPY WITH PROPOFOL N/A 12/16/2021   Procedure: COLONOSCOPY WITH PROPOFOL;  Surgeon: Robert Bellow, MD;  Location: [facility] ENDOSCOPY;  Service: Endoscopy;  Laterality: N/A;   ESOPHAGOGASTRODUODENOSCOPY (EGD) WITH PROPOFOL N/A 03/06/2018   Procedure: ESOPHAGOGASTRODUODENOSCOPY (EGD) WITH PROPOFOL;  Surgeon: Lin Landsman, MD;  Location: [facility] ENDOSCOPY;  Service: Gastroenterology;  Laterality: N/A;   ESOPHAGOGASTRODUODENOSCOPY (EGD) WITH PROPOFOL N/A 12/16/2021   Procedure: ESOPHAGOGASTRODUODENOSCOPY (EGD) WITH PROPOFOL;  Surgeon: Robert Bellow, MD;  Location: [facility] ENDOSCOPY;  Service: Endoscopy;  Laterality: N/A;   HERNIA REPAIR     INCISION AND DRAINAGE ABSCESS N/A 05/02/2020   Procedure: INCISION AND DRAINAGE ABSCESS;  Surgeon: Robert Bellow, MD;  Location: [facility] ORS;  Service: General;  Laterality: N/A;   INCISION AND DRAINAGE PERIRECTAL ABSCESS Right 06/16/2015   Procedure: [id number] DEBRIDEMENT right inner thigh ABSCESS;  Surgeon: Florene Glen, MD;  Location: [facility] ORS;  Service: General;  Laterality: Right;   INSERTION OF MESH N/A 12/20/2017   Procedure: INSERTION OF MESH;  Surgeon: Rosana Hoes,  Arn Medal, MD;  Location: [facility] ORS;  Service: General;  Laterality: N/A;   KNEE ARTHROSCOPY WITH ANTERIOR CRUCIATE LIGAMENT (ACL) REPAIR Left 2002   KNEE SURGERY Left    PILONIDAL CYST EXCISION N/A 05/02/2020   Procedure: CYST EXCISION PILONIDAL EXTENSIVE;  Surgeon: Robert Bellow, MD;  Location: [facility] ORS;  Service: General;  Laterality: N/A;   UMBILICAL HERNIA REPAIR N/A 12/20/2017   Procedure: HERNIA REPAIR UMBILICAL ADULT;  Surgeon: [name], MD;  Location: [facility] ORS;  Service: General;  Laterality: N/A;   VIDEO ASSISTED THORACOSCOPY  (VATS)/WEDGE RESECTION Right 04/29/2022   Procedure: RIGHT VIDEO ASSISTED THORACOSCOPY (VATS)/ STAPELING OF BLEBS;  Surgeon: [name], MD;  Location: [facility] OR;  Service: Thoracic;  Laterality: Right;    Social History   Socioeconomic History   Marital status: Single    Spouse name: Not on file   Number of children: Not on file   Years of education: Not on file   Highest education level: Not on file  Occupational History   Not on file  Tobacco Use   Smoking status: Former    Packs/day: 2.00    Years: 30.00    Total pack years: 60.00    Types: Cigarettes   Smokeless tobacco: Never  Vaping Use   Vaping Use: Never used  Substance and Sexual Activity   Alcohol use: Not Currently    Comment: social   Drug use: No   Sexual activity: Not Currently  Other Topics Concern   Not on file  Social History Narrative   Not on file   Social Determinants of Health   Financial Resource Strain: Not on file  Food Insecurity: No Food Insecurity (04/28/2022)   Hunger Vital Sign    Worried About Running Out of Food in the Last Year: Never true    Ran Out of Food in the Last Year: Never true  Transportation Needs: No Transportation Needs (04/28/2022)   PRAPARE - Hydrologist (Medical): No    Lack of Transportation (Non-Medical): No  Physical Activity: Not on file  Stress: Not on file  Social Connections: Not on file  Intimate Partner Violence: Not At Risk (04/28/2022)   Humiliation, Afraid, Rape, and Kick questionnaire    Fear of Current or Ex-Partner: No    Emotionally Abused: No    Physically Abused: No    Sexually Abused: No    Family History  Problem Relation Age of Onset   Heart disease Father 57   Cancer Father 89       Lung Cancer   Cancer Paternal Grandfather 37       in the bones     Current Outpatient Medications:    pantoprazole (PROTONIX) 40 MG tablet, Take 1 tablet (40 mg total) by mouth daily., Disp: 30 tablet, Rfl: 0    sucralfate (CARAFATE) 1 g tablet, Take 1 tablet (1 g total) by mouth 4 (four) times daily -  with meals and at bedtime. Crush and dissolve in 10 mL of warm water prior to swallowing, Disp: 120 tablet, Rfl: 0   albuterol (VENTOLIN HFA) 108 (90 Base) MCG/ACT inhaler, Inhale 2 puffs into the lungs every 6 (six) hours as needed for wheezing or shortness of breath. (Patient not taking: Reported on 06/29/2022), Disp: 8 g, Rfl: 2   ALPRAZolam (XANAX) 0.25 MG tablet, Take 1 tablet (0.25 mg total) by mouth 2 (two) times daily as needed for anxiety. (Patient not taking: Reported on 06/29/2022), Disp:  30 tablet, Rfl: 0   amLODipine (NORVASC) 2.5 MG tablet, Take 1 tablet (2.5 mg total) by mouth daily. (Patient not taking: Reported on 06/29/2022), Disp: 90 tablet, Rfl: 1   baclofen (LIORESAL) 10 MG tablet, Take 1 tablet (10 mg total) by mouth 2 (two) times daily., Disp: 30 each, Rfl: 0   doxycycline (VIBRA-TABS) 100 MG tablet, Take 1 tablet (100 mg total) by mouth 2 (two) times daily. (Patient not taking: Reported on 06/29/2022), Disp: 14 tablet, Rfl: 0   ezetimibe (ZETIA) 10 MG tablet, TAKE 1 TABLET BY MOUTH ONCE DAILY, Disp: 90 tablet, Rfl: 3   gabapentin (NEURONTIN) 100 MG capsule, Take 1 capsule (100 mg total) by mouth 3 (three) times daily., Disp: 30 capsule, Rfl: 2   ondansetron (ZOFRAN) 8 MG tablet, Take 1 tablet (8 mg total) by mouth every 8 (eight) hours as needed for nausea or vomiting. Starting from day 3, Disp: 30 tablet, Rfl: 2   prochlorperazine (COMPAZINE) 10 MG tablet, Take 1 tablet (10 mg total) by mouth every 6 (six) hours as needed., Disp: 30 tablet, Rfl: 2  PHYSICAL EXAM: ECOG FS:1 - Symptomatic but completely ambulatory    Vitals:   07/14/22 1010  BP: 106/84  Pulse: 82  Resp: 16  Temp: 98.2 F (36.8 C)  TempSrc: Oral  SpO2: 98%  Weight: 221 lb 9.6 oz (100.5 kg)   Physical Exam Vitals and nursing note reviewed.  Constitutional:      Appearance: He is well-developed. He is not  ill-appearing or toxic-appearing.  HENT:     Head: Normocephalic.     Nose: Nose normal.     Mouth/Throat:     Mouth: Mucous membranes are moist.     Comments: No oral lesions. No thrush Eyes:     Conjunctiva/sclera: Conjunctivae normal.  Neck:     Vascular: No JVD.  Cardiovascular:     Rate and Rhythm: Normal rate and regular rhythm.     Pulses: Normal pulses.     Heart sounds: Normal heart sounds.  Pulmonary:     Effort: Pulmonary effort is normal.     Breath sounds: Normal breath sounds.  Abdominal:     General: There is no distension.  Musculoskeletal:     Cervical back: Normal range of motion.  Skin:    General: Skin is warm and dry.  Neurological:     Mental Status: He is oriented to person, place, and time.        LABORATORY DATA: I have reviewed the data as listed    Latest Ref Rng & Units 07/14/2022    9:49 AM 06/29/2022    8:11 AM 06/22/2022   11:12 AM  CBC  WBC 4.0 - 10.5 K/uL 3.8  3.2  5.4   Hemoglobin 13.0 - 17.0 g/dL 13.7  13.7  13.3   Hematocrit 39.0 - 52.0 % 40.6  41.4  40.1   Platelets 150 - 400 K/uL 221  187  292         Latest Ref Rng & Units 07/14/2022    9:49 AM 06/29/2022    8:11 AM 06/22/2022   11:12 AM  CMP  Glucose 70 - 99 mg/dL 112  88  107   BUN 6 - 20 mg/dL _0 Creatinine 0.61 - 1.24 mg/dL 0.91  0.77  0.85   Sodium 135 - 145 mmol/L 136  135  138   Potassium 3.5 - 5.1 mmol/L 4.0  4.3  3.7  Chloride 98 - 111 mmol/L 102  105  106   CO2 22 - 32 mmol/L _0 Calcium 8.9 - 10.3 mg/dL 9.0  9.3  9.2   Total Protein 6.5 - 8.1 g/dL 6.8  7.5  7.5   Total Bilirubin 0.3 - 1.2 mg/dL 0.3  0.4  0.3   Alkaline Phos 38 - 126 U/L 84  67  71   AST 15 - 41 U/L _1 ALT 0 - 44 U/L _2 RADIOGRAPHIC STUDIES (from last 24 hours if applicable) I have personally reviewed the radiological images as listed and agreed with the findings in the report. No results found.      Visit Diagnosis: 1. Malignant neoplasm  of lower third of esophagus (HCC)   2. Persistent indigestion      No orders of the defined types were placed in this encounter.   All questions were answered. The patient knows to call the clinic with any problems, questions or concerns. No barriers to learning was detected.  I have spent a total of 30 minutes minutes of face-to-face and non-face-to-face time, preparing to see the patient, obtaining and/or reviewing separately obtained history, performing a medically appropriate examination, counseling and educating the patient, ordering tests, documenting clinical information in the electronic health record, and care coordination (communications with other health care professionals or caregivers).    Thank you for allowing me to participate in the care of this patient.    Barrie Folk, PA-C Department of Hematology/Oncology [facility] Phone: [phone]  [phone]    07/14/2022 11:24 AM

## 2022-07-14 ENCOUNTER — Inpatient Hospital Stay: Payer: BC Managed Care – PPO

## 2022-07-14 ENCOUNTER — Inpatient Hospital Stay (HOSPITAL_BASED_OUTPATIENT_CLINIC_OR_DEPARTMENT_OTHER): Payer: BC Managed Care – PPO | Admitting: Physician Assistant

## 2022-07-14 ENCOUNTER — Other Ambulatory Visit: Payer: Self-pay

## 2022-07-14 VITALS — BP 106/84 | HR 82 | Temp 98.2°F | Resp 16 | Wt 221.6 lb

## 2022-07-14 DIAGNOSIS — Z800 Family history of malignant neoplasm of digestive organs: Secondary | ICD-10-CM | POA: Insufficient documentation

## 2022-07-14 DIAGNOSIS — R110 Nausea: Secondary | ICD-10-CM | POA: Diagnosis not present

## 2022-07-14 DIAGNOSIS — C159 Malignant neoplasm of esophagus, unspecified: Secondary | ICD-10-CM | POA: Diagnosis not present

## 2022-07-14 DIAGNOSIS — C155 Malignant neoplasm of lower third of esophagus: Secondary | ICD-10-CM

## 2022-07-14 DIAGNOSIS — Z5111 Encounter for antineoplastic chemotherapy: Secondary | ICD-10-CM | POA: Insufficient documentation

## 2022-07-14 DIAGNOSIS — Z87891 Personal history of nicotine dependence: Secondary | ICD-10-CM | POA: Insufficient documentation

## 2022-07-14 DIAGNOSIS — K30 Functional dyspepsia: Secondary | ICD-10-CM

## 2022-07-14 DIAGNOSIS — Z801 Family history of malignant neoplasm of trachea, bronchus and lung: Secondary | ICD-10-CM | POA: Diagnosis not present

## 2022-07-14 DIAGNOSIS — I10 Essential (primary) hypertension: Secondary | ICD-10-CM | POA: Diagnosis not present

## 2022-07-14 LAB — CMP (CANCER CENTER ONLY)
ALT: 20 U/L (ref 0–44)
AST: 22 U/L (ref 15–41)
Albumin: 3.3 g/dL — ABNORMAL LOW (ref 3.5–5.0)
Alkaline Phosphatase: 84 U/L (ref 38–126)
Anion gap: 5 (ref 5–15)
BUN: 6 mg/dL (ref 6–20)
CO2: 29 mmol/L (ref 22–32)
Calcium: 9 mg/dL (ref 8.9–10.3)
Chloride: 102 mmol/L (ref 98–111)
Creatinine: 0.91 mg/dL (ref 0.61–1.24)
GFR, Estimated: >60 mL/min (ref >60)
Glucose, Bld: 112 mg/dL — ABNORMAL HIGH (ref 70–99)
Potassium: 4 mmol/L (ref 3.5–5.1)
Sodium: 136 mmol/L (ref 135–145)
Total Bilirubin: 0.3 mg/dL (ref 0.3–1.2)
Total Protein: 6.8 g/dL (ref 6.5–8.1)

## 2022-07-14 LAB — CBC WITH DIFFERENTIAL (CANCER CENTER ONLY)
Abs Immature Granulocytes: 0.01 10*3/uL (ref 0.00–0.07)
Basophils Absolute: 0 10*3/uL (ref 0.0–0.1)
Basophils Relative: 1 %
Eosinophils Absolute: 0 10*3/uL (ref 0.0–0.5)
Eosinophils Relative: 1 %
HCT: 40.6 % (ref 39.0–52.0)
Hemoglobin: 13.7 g/dL (ref 13.0–17.0)
Immature Granulocytes: 0 %
Lymphocytes Relative: 16 %
Lymphs Abs: 0.6 10*3/uL — ABNORMAL LOW (ref 0.7–4.0)
MCH: 28.9 pg (ref 26.0–34.0)
MCHC: 33.7 g/dL (ref 30.0–36.0)
MCV: 85.7 fL (ref 80.0–100.0)
Monocytes Absolute: 0.8 10*3/uL (ref 0.1–1.0)
Monocytes Relative: 20 %
Neutro Abs: 2.4 10*3/uL (ref 1.7–7.7)
Neutrophils Relative %: 62 %
Platelet Count: 221 10*3/uL (ref 150–400)
RBC: 4.74 MIL/uL (ref 4.22–5.81)
RDW: 21.5 % — ABNORMAL HIGH (ref 11.5–15.5)
WBC Count: 3.8 10*3/uL — ABNORMAL LOW (ref 4.0–10.5)
nRBC: 0 % (ref 0.0–0.2)

## 2022-07-14 LAB — MAGNESIUM: Magnesium: 1.9 mg/dL (ref 1.7–2.4)

## 2022-07-14 MED ORDER — PANTOPRAZOLE SODIUM 40 MG PO TBEC: 40.0000 mg | DELAYED_RELEASE_TABLET | Freq: Every day | ORAL | 0 refills | Status: DC

## 2022-07-14 MED ORDER — SUCRALFATE 1 G PO TABS: 1.0000 g | ORAL_TABLET | Freq: Three times a day (TID) | ORAL | 0 refills | Status: DC

## 2022-08-01 NOTE — Assessment & Plan Note (Signed)
cT1N0M0, stage MMR normal, HER2(+), PD-L1 2%  -Screening EGD showed Screening EGD on 12/16/21 showed only reflux esophagitis, no bleeding. Pathology revealed invasive moderately differentiated adenocarcinoma at 36 cm  --staging PET scan 01/28/22 showed no FDG-avid disease. -Pt declined surgery, and wants to try chemoRT -Pt started concurrent chemotherapy and radiation 05/24/2022, and completed on July 02, 2022. -She is recovering well from chemoradiation.  Plan to repeat CT scan and endoscopy in 2 to 3 months.

## 2022-08-02 ENCOUNTER — Other Ambulatory Visit: Payer: Self-pay

## 2022-08-02 ENCOUNTER — Inpatient Hospital Stay: Payer: BC Managed Care – PPO | Attending: Physician Assistant

## 2022-08-02 ENCOUNTER — Inpatient Hospital Stay: Payer: BC Managed Care – PPO | Admitting: Hematology

## 2022-08-02 ENCOUNTER — Encounter: Payer: Self-pay | Admitting: Hematology

## 2022-08-02 VITALS — BP 115/77 | HR 80 | Temp 98.2°F | Resp 18 | Ht 70.0 in | Wt 218.4 lb

## 2022-08-02 DIAGNOSIS — I10 Essential (primary) hypertension: Secondary | ICD-10-CM | POA: Insufficient documentation

## 2022-08-02 DIAGNOSIS — R197 Diarrhea, unspecified: Secondary | ICD-10-CM | POA: Diagnosis not present

## 2022-08-02 DIAGNOSIS — C159 Malignant neoplasm of esophagus, unspecified: Secondary | ICD-10-CM | POA: Insufficient documentation

## 2022-08-02 DIAGNOSIS — Z923 Personal history of irradiation: Secondary | ICD-10-CM | POA: Diagnosis not present

## 2022-08-02 DIAGNOSIS — C155 Malignant neoplasm of lower third of esophagus: Secondary | ICD-10-CM

## 2022-08-02 LAB — CBC WITH DIFFERENTIAL (CANCER CENTER ONLY)
Abs Immature Granulocytes: 0.01 K/uL (ref 0.00–0.07)
Basophils Absolute: 0 K/uL (ref 0.0–0.1)
Basophils Relative: 0 %
Eosinophils Absolute: 0.1 K/uL (ref 0.0–0.5)
Eosinophils Relative: 2 %
HCT: 39.7 % (ref 39.0–52.0)
Hemoglobin: 13.6 g/dL (ref 13.0–17.0)
Immature Granulocytes: 0 %
Lymphocytes Relative: 27 %
Lymphs Abs: 1.6 K/uL (ref 0.7–4.0)
MCH: 29.9 pg (ref 26.0–34.0)
MCHC: 34.3 g/dL (ref 30.0–36.0)
MCV: 87.3 fL (ref 80.0–100.0)
Monocytes Absolute: 0.9 K/uL (ref 0.1–1.0)
Monocytes Relative: 15 %
Neutro Abs: 3.2 K/uL (ref 1.7–7.7)
Neutrophils Relative %: 56 %
Platelet Count: 261 K/uL (ref 150–400)
RBC: 4.55 MIL/uL (ref 4.22–5.81)
RDW: 20.8 % — ABNORMAL HIGH (ref 11.5–15.5)
WBC Count: 5.8 K/uL (ref 4.0–10.5)
nRBC: 0 % (ref 0.0–0.2)

## 2022-08-02 LAB — CMP (CANCER CENTER ONLY)
ALT: 12 U/L (ref 0–44)
AST: 19 U/L (ref 15–41)
Albumin: 3.3 g/dL — ABNORMAL LOW (ref 3.5–5.0)
Alkaline Phosphatase: 93 U/L (ref 38–126)
Anion gap: 7 (ref 5–15)
BUN: 6 mg/dL (ref 6–20)
CO2: 27 mmol/L (ref 22–32)
Calcium: 9 mg/dL (ref 8.9–10.3)
Chloride: 106 mmol/L (ref 98–111)
Creatinine: 0.87 mg/dL (ref 0.61–1.24)
GFR, Estimated: >60 mL/min
Glucose, Bld: 118 mg/dL — ABNORMAL HIGH (ref 70–99)
Potassium: 3.3 mmol/L — ABNORMAL LOW (ref 3.5–5.1)
Sodium: 140 mmol/L (ref 135–145)
Total Bilirubin: 0.4 mg/dL (ref 0.3–1.2)
Total Protein: 6.8 g/dL (ref 6.5–8.1)

## 2022-08-02 LAB — MAGNESIUM: Magnesium: 2 mg/dL (ref 1.7–2.4)

## 2022-08-02 NOTE — Progress Notes (Signed)
[facility]   [phone] [phone]   Clinic Follow up Note   Patient Care Team: Bruce Mc, MD as PCP - General (Internal Medicine) [facility] Bruce Gleason, MD as Consulting Physician (General Surgery) Bruce Prophet Hilton Cork, MD as Consulting Physician (Gastroenterology) Bruce Paterson, MD as Consulting Physician (Thoracic Surgery) Bruce Merle, MD as Consulting Physician (Hematology)  Date of Service:  08/02/2022  CHIEF COMPLAINT: f/u of Esophageal cancer [facility])   CURRENT THERAPY:  chemoradiation   ASSESSMENT:  B[facility] is a 52 y.o. 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 Pathology revealed invasive moderately differentiated adenocarcinoma at 36 cm  --staging PET scan 01/28/22 showed no FDG-avid disease. -Pt declined surgery, and wants to try chemoRT -Pt started concurrent chemotherapy and radiation 05/24/2022, and completed on July 02, 2022. -She is recovering well from chemoradiation.  Plan to repeat CT scan and endoscopy in 6-7 weeks -will repeat EGD in 2 months. His GI [facility] has retired, will refer him to GI [facility]  -phone visit after CT  -We discussed salvage surgery if he does not achieve complete response after chemoradiation.     PLAN: -lab reviewed. - Referral to GI [facility] -Repeat CT scan 5- 6 weeks and Endoscopy in 2-3 months -f/u after CT   SUMMARY OF ONCOLOGIC HISTORY: Oncology History  Esophageal cancer ([place])  12/16/2021 Procedure   Upper GI endoscopy, [facility]  Impression: - LA Grade C reflux esophagitis with no bleeding. Rule out Barrett's esophagus. Biopsied. - Small hiatal hernia. - Normal examined duodenum.   12/16/2021 Initial Biopsy   DIAGNOSIS:  A. ESOPHAGUS, 40 CM; COLD BIOPSY:  - SQUAMOCOLUMNAR MUCOSA WITH INTESTINAL METAPLASIA, COMPATIBLE WITH HISTORY OF  BARRETT'S ESOPHAGUS, WITH FOCAL LOW GRADE DYSPLASIA.  - NEGATIVE FOR HIGH-GRADE DYSPLASIA AND MALIGNANCY.   B. ESOPHAGUS, 38 CM; BIOPSY:  - SQUAMOCOLUMNAR MUCOSA WITH INTESTINAL METAPLASIA, COMPATIBLE WITH HISTORY OF BARRETT'S ESOPHAGUS, WITH FOCAL LOW GRADE DYSPLASIA.  - NEGATIVE FOR HIGH-GRADE DYSPLASIA AND MALIGNANCY.   C. ESOPHAGUS, 36 CM; COLD BIOPSY:  - INVASIVE MODERATELY DIFFERENTIATED ADENOCARCINOMA, ARISING IN A BACKGROUND OF BARRETT'S ESOPHAGUS WITH HIGH-GRADE DYSPLASIA.   D. COLON, CECUM; COLD BIOPSY:  - CHRONIC COLITIS WITH MODERATE ACTIVITY (CRYPTITIS AND CRYPT ABSCESSES).  - NEGATIVE FOR GRANULOMA, DYSPLASIA, AND MALIGNANCY.   E. COLON, DESCENDING; COLD BIOPSY:  - CHRONIC COLITIS WITH MODERATE ACTIVITY (CRYPTITIS AND CRYPT ABSCESSES). - NEGATIVE FOR GRANULOMA, DYSPLASIA, AND MALIGNANCY.   F. RECTUM; COLD BIOPSY:  - CHRONIC PROCTITIS WITH MODERATE ACTIVITY (CRYPTITIS AND CRYPT ABSCESSES).  - NEGATIVE FOR GRANULOMA, DYSPLASIA, AND MALIGNANCY.   Comment:  Biopsy sections taken from the cecum, descending colon, and rectum display crypt architectural abnormalities (crypt branching and bifid crypts), basal lymphoplasmacytosis, and active intramucosal inflammation ranging from cryptitis to crypt abscesses.  The features are highly suggestive of an evolving inflammatory bowel disease.  Clinical and endoscopic correlation is recommended.    12/16/2021 Cancer Staging   Staging form: Esophagus - Adenocarcinoma, AJCC 8th Edition - Clinical stage from 12/16/2021: Stage I (cT1b, cN0, cM0, G2) - Signed by Bruce Merle, MD on 04/27/2022 Stage prefix: Initial diagnosis Total positive nodes: 0 Histologic grading system: 3 grade system Type of surgical resection with curative intent: Endoscopic mucosal resection   12/17/2021 Initial Diagnosis   Esophageal cancer ([place])   12/25/2021 Imaging   EXAM: CT CHEST WITH CONTRAST  IMPRESSION:  1. Moderate wall thickening involving the distal  esophagus near the GE junction but no paraesophageal or upper abdominal lymphadenopathy. 2. No findings for pulmonary metastatic disease. 3. Age advanced atherosclerotic calcifications involving the coronary arteries. 4. Advanced emphysematous changes with paraseptal bulla.   Aortic Atherosclerosis (ICD10-I70.0) and Emphysema (ICD10-J43.9).   01/04/2022 Procedure   Upper EUS, [facility]  EGD Impressions: - Esophageal mucosal changes consistent with long-segment Barrett's esophagus. - Cratered esophageal ulcer at 40 cm (up to 8 mm in largest dimension). Biopsied. Suspect this is the site of previous biopsy proven adenocarcinoma. - Normal stomach. - Normal examined duodenum.   EUS Impressions: - There was no sign of significant pathology in the esophagus. No obvious esophageal mass was seen. Unable to T stage due to no mass lesion seen. - No periesophageal or perigastric lymph nodes visualized. - Normal visualized portions of the liver. - Normal celiac region.   01/04/2022 Pathology Results   DIAGNOSIS    A. Esophagus, 40 cm, esophageal ulcer, endoscopic biopsy:   Barrett esophagus with high-grade dysplasia. (See comment)   Cytomegalovirus esophagitis.    Comment: A p53 immunostain demonstrates loss of p53 expression within the atypical gastric mucosa, consistent with TP53 null mutant, supporting a diagnosis of high-grade dysplasia. CMV immunohistochemistry highlights rare positive cells, corroborated by viral cytopathic effect on H&E. PASD stain is negative for fungal organisms. Multiple step sections are examined.        01/28/2022 PET scan   FDG PET SBMT W NONDIAGNOSTIC CONCURRENT CT INITIAL   IMPRESSION:  1.  No evidence of FDG avid disease.   2.  Gluteal cleft findings favored to infectious/inflammatory, correlate with physical exam.    03/15/2022 Pathology Results   DIAGNOSIS    A. Esophagus, 36 cm, endomucosal resection:   Invasive, moderately differentiated  adenocarcinoma. Tumor is seen throughout a thickened muscularis mucosal layer. Only a small amount of submucosa is present, best seen in block A2, and here tumor is present within the submucosa. Cauterized tumor is present at the deep margin (orange ink) and a peripheral margin (blue ink). Negative for lymphovascular and perineural invasion. See summary in synoptic report.   B. Esophagus, 36 cm #2, endomucosal resection:   Invasive, moderately-differentiated adenocarcinoma. Tumor is seen throughout a thickened muscularis mucosal layer. A good representation of submucosal tissue is also present, with small areas of tumor seen in submucosal tissue and focally at the deep (orange ink) margin. Tumor is also present at a peripheral margin (blue ink). Negative for lymphovascular and perineural invasion. See summary in synoptic report.   C. Esophagus, 36 cm #3, endomucosal resection:   Invasive moderately differentiated adenocarcinoma. Tumor is focally seen in a thickened muscularis mucosal layer. A mucous gland is present but no submucosal tissue is present in this small specimen. Cauterized tumor is present at orange ink (the margin of this small specimen is entirely inked orange). Negative for lymphovascular and perineural invasion. See summary in synoptic report.   D. Esophagus, 39 cm, biopsy:   Gastric type mucosa. No squamous mucosa is seen. Negative for intestinal metaplasia.  Negative for dysplasia.   E. Esophagus, 37 cm, biopsy:    Focal area concerning for high grade dysplasia is seen in a background of squamocolumnar junctional mucosa with goblet cell intestinal metaplasia (Barrett's esophagus).  The focal area is best seen on level 1, deeper levels 3-5 do not demonstrate the concerning area. Negative for malignancy.   Comment:  See the synoptic report for summary.  While I very  much suspect the T-stage is pT1b (submucosa), this is technically considered indefinite in the  presence of positive deep margins. MSI testing has been requested on block B2 of this case; results will be issued separately.     05/24/2022 -  Chemotherapy   Patient is on Treatment Plan : ESOPHAGUS Carboplatin + Paclitaxel Weekly X 6 Weeks with XRT        INTERVAL HISTORY:  Bruce Graham is here for a follow up of Esophageal cancer [facility])  He was last seen by NP Mendel Ryder on 06/30/2019 He presents to the clinic alone.Pt states that he had some diarrhea. H e also states that as long as he chew is food really good he doesn't not have trouble swallowing.     All other systems were reviewed with the patient and are negative.  MEDICAL HISTORY:  Past Medical History:  Diagnosis Date   Abscess of right thigh 06/16/2015   Barrett esophagus    Bronchitis    Complication of anesthesia    hard to wake up after hernia surgery   Ear infection 12/13/2017   taking amoxicillin   Family history of adverse reaction to anesthesia    mom hard to wake up    GERD (gastroesophageal reflux disease)    HLD (hyperlipidemia)    Hydradenitis    Left leg   Hypertension    Intervertebral disc disorder    Myalgia    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/07/2015   Suppurative hidradenitis    Umbilical hernia without obstruction and without gangrene 11/22/2017    SURGICAL HISTORY: Past Surgical History:  Procedure Laterality Date   COLONOSCOPY WITH PROPOFOL N/A 03/06/2018   Procedure: COLONOSCOPY WITH PROPOFOL;  Surgeon: Lin Landsman, MD;  Location: [facility] ENDOSCOPY;  Service: Gastroenterology;  Laterality: N/A;   COLONOSCOPY WITH PROPOFOL N/A 12/16/2021   Procedure: COLONOSCOPY WITH PROPOFOL;  Surgeon: Robert Bellow, MD;  Location: [facility] ENDOSCOPY;  Service: Endoscopy;  Laterality: N/A;   ESOPHAGOGASTRODUODENOSCOPY (EGD) WITH PROPOFOL N/A 03/06/2018   Procedure: ESOPHAGOGASTRODUODENOSCOPY (EGD) WITH PROPOFOL;  Surgeon: Lin Landsman, MD;  Location: [facility] ENDOSCOPY;  Service: Gastroenterology;   Laterality: N/A;   ESOPHAGOGASTRODUODENOSCOPY (EGD) WITH PROPOFOL N/A 12/16/2021   Procedure: ESOPHAGOGASTRODUODENOSCOPY (EGD) WITH PROPOFOL;  Surgeon: Robert Bellow, MD;  Location: [facility] ENDOSCOPY;  Service: Endoscopy;  Laterality: N/A;   HERNIA REPAIR     INCISION AND DRAINAGE ABSCESS N/A 05/02/2020   Procedure: INCISION AND DRAINAGE ABSCESS;  Surgeon: Robert Bellow, MD;  Location: [facility] ORS;  Service: General;  Laterality: N/A;   INCISION AND DRAINAGE PERIRECTAL ABSCESS Right 06/16/2015   Procedure: IRRIGATION AND DEBRIDEMENT right inner thigh ABSCESS;  Surgeon: Florene Glen, MD;  Location: [facility] ORS;  Service: General;  Laterality: Right;   INSERTION OF MESH N/A 12/20/2017   Procedure: INSERTION OF MESH;  Surgeon: [name], MD;  Location: [facility] ORS;  Service: General;  Laterality: N/A;   KNEE ARTHROSCOPY WITH ANTERIOR CRUCIATE LIGAMENT (ACL) REPAIR Left 2002   KNEE SURGERY Left    PILONIDAL CYST EXCISION N/A 05/02/2020   Procedure: CYST EXCISION PILONIDAL EXTENSIVE;  Surgeon: Robert Bellow, MD;  Location: [facility] ORS;  Service: General;  Laterality: N/A;   UMBILICAL HERNIA REPAIR N/A 12/20/2017   Procedure: HERNIA REPAIR UMBILICAL ADULT;  Surgeon: [name], MD;  Location: [facility] ORS;  Service: General;  Laterality: N/A;   VIDEO ASSISTED THORACOSCOPY (VATS)/WEDGE RESECTION Right 04/29/2022   Procedure: RIGHT VIDEO ASSISTED THORACOSCOPY (VATS)/ STAPELING OF BLEBS;  Surgeon: [name],  MD;  Location: [facility];  Service: Thoracic;  Laterality: Right;    I have reviewed the social history and family history with the patient and they are unchanged from previous note.  ALLERGIES:  has No Known Allergies.  MEDICATIONS:  Current Outpatient Medications  Medication Sig Dispense Refill   albuterol (VENTOLIN HFA) 108 (90 Base) MCG/ACT inhaler Inhale 2 puffs into the lungs every 6 (six) hours as needed for wheezing or shortness of breath. (Patient not taking:  Reported on 06/29/2022) 8 g 2   ALPRAZolam (XANAX) 0.25 MG tablet Take 1 tablet (0.25 mg total) by mouth 2 (two) times daily as needed for anxiety. (Patient not taking: Reported on 06/29/2022) 30 tablet 0   amLODipine (NORVASC) 2.5 MG tablet Take 1 tablet (2.5 mg total) by mouth daily. (Patient not taking: Reported on 06/29/2022) 90 tablet 1   ezetimibe (ZETIA) 10 MG tablet TAKE 1 TABLET BY MOUTH ONCE DAILY 90 tablet 3   gabapentin (NEURONTIN) 100 MG capsule Take 1 capsule (100 mg total) by mouth 3 (three) times daily. 30 capsule 2   No current facility-administered medications for this visit.    PHYSICAL EXAMINATION: ECOG PERFORMANCE STATUS: 1 - Symptomatic but completely ambulatory  Vitals:   08/02/22 1208  BP: 115/77  Pulse: 80  Resp: 18  Temp: 98.2 F (36.8 C)  SpO2: 98%   Wt Readings from Last 3 Encounters:  08/02/22 218 lb 6.4 oz (99.1 kg)  07/14/22 221 lb 9.6 oz (100.5 kg)  06/29/22 223 lb 3.2 oz (101.2 kg)     GENERAL:alert, no distress and comfortable SKIN: skin color normal, no rashes or significant lesions EYES: normal, Conjunctiva are pink and non-injected, sclera clear  NEURO: alert & oriented x 3 with fluent speech   LABORATORY DATA:  I have reviewed the data as listed    Latest Ref Rng & Units 08/02/2022   11:31 AM 07/14/2022    9:49 AM 06/29/2022    8:11 AM  CBC  WBC 4.0 - 10.5 K/uL 5.8  3.8  3.2   Hemoglobin 13.0 - 17.0 g/dL 13.6  13.7  13.7   Hematocrit 39.0 - 52.0 % 39.7  40.6  41.4   Platelets 150 - 400 K/uL 261  221  187         Latest Ref Rng & Units 08/02/2022   11:31 AM 07/14/2022    9:49 AM 06/29/2022    8:11 AM  CMP  Glucose 70 - 99 mg/dL 118  112  88   BUN 6 - 20 mg/dL '6  6  7   '$ Creatinine 0.61 - 1.24 mg/dL 0.87  0.91  0.77   Sodium 135 - 145 mmol/L 140  136  135   Potassium 3.5 - 5.1 mmol/L 3.3  4.0  4.3   Chloride 98 - 111 mmol/L 106  102  105   CO2 22 - 32 mmol/L '27  29  25   '$ Calcium 8.9 - 10.3 mg/dL 9.0  9.0  9.3   Total Protein 6.5 - 8.1  g/dL 6.8  6.8  7.5   Total Bilirubin 0.3 - 1.2 mg/dL 0.4  0.3  0.4   Alkaline Phos 38 - 126 U/L 93  84  67   AST 15 - 41 U/L '19  22  27   '$ ALT 0 - 44 U/L '12  20  21       '$ RADIOGRAPHIC STUDIES: I have personally reviewed the radiological images as listed and agreed with the findings  in the report. No results found.    Orders Placed This Encounter  Procedures   CT Chest W Contrast    Standing Status:   Future    Standing Expiration Date:   08/02/2023    Order Specific Question:   If indicated for the ordered procedure, I authorize the administration of contrast media per Radiology protocol    Answer:   Yes    Order Specific Question:   Does the patient have a contrast media/X-ray dye allergy?    Answer:   No    Order Specific Question:   Preferred imaging location?    Answer:   [facility]   Ambulatory referral to Gastroenterology    Referral Priority:   Routine    Referral Type:   Consultation    Referral Reason:   Specialty Services Required    Number of Visits Requested:   1   All questions were answered. The patient knows to call the clinic with any problems, questions or concerns. No barriers to learning was detected. The total time spent in the appointment was 30 minutes.     Bruce Merle, MD 08/02/2022   Felicity Coyer, CMA, am acting as scribe for Bruce Merle, MD.   I have reviewed the above documentation for accuracy and completeness, and I agree with the above.

## 2022-08-03 ENCOUNTER — Other Ambulatory Visit: Payer: Self-pay

## 2022-08-10 DIAGNOSIS — U071 COVID-19: Secondary | ICD-10-CM | POA: Diagnosis not present

## 2022-08-10 DIAGNOSIS — R519 Headache, unspecified: Secondary | ICD-10-CM | POA: Diagnosis not present

## 2022-08-10 DIAGNOSIS — R509 Fever, unspecified: Secondary | ICD-10-CM | POA: Diagnosis not present

## 2022-08-10 DIAGNOSIS — R059 Cough, unspecified: Secondary | ICD-10-CM | POA: Diagnosis not present

## 2022-08-16 ENCOUNTER — Ambulatory Visit
Admission: RE | Admit: 2022-08-16 | Discharge: 2022-08-16 | Disposition: A | Discharge status: Home / Self Care | Payer: BC Managed Care – PPO | Source: Ambulatory Visit | Attending: Hematology | Admitting: Hematology

## 2022-08-16 NOTE — Progress Notes (Signed)
  Radiation Oncology         [phone] ________________________________  Name: Bruce [facility] MRN: [id number]  Date of Service: 08/16/2022  DOB: 10/08/1970  Post Treatment Telephone Note  Diagnosis:  Stage I, cT1N0M0, adenocarcinoma of the distal esophagus.   Intent: Curative  Radiation Treatment Dates: 05/24/2022 through 07/02/2022 Site Technique Total Dose (Gy) Dose per Fx (Gy) Completed Fx Beam Energies  Esophagus: Esoph IMRT 45/45 1.8 25/25 6X  Esophagus: Esoph_Bst IMRT 5.4/5.4 1.8 3/3 6X   (as documented in provider EOT note)   The patient was available for call today.   Symptoms of fatigue have improved since completing therapy.  Symptoms of skin changes have improved since completing therapy.  Symptoms of esophagitis have improved since completing therapy.   The patient has scheduled follow up with his medical oncologist Dr. Burr Medico for ongoing care, and was encouraged to call if he  develops concerns or questions regarding radiation.  This concludes the interview.   Leandra Kern, LPN

## 2022-09-13 ENCOUNTER — Ambulatory Visit (HOSPITAL_COMMUNITY): Payer: BC Managed Care – PPO

## 2022-09-13 ENCOUNTER — Inpatient Hospital Stay: Payer: BC Managed Care – PPO

## 2022-09-16 ENCOUNTER — Inpatient Hospital Stay: Payer: BC Managed Care – PPO | Admitting: Hematology

## 2022-09-16 NOTE — Assessment & Plan Note (Signed)
cT1N0M0, stage MMR normal, HER2(+), PD-L1 2%  -Screening EGD showed Screening EGD on 12/16/21 showed only reflux esophagitis, no bleeding. Pathology revealed invasive moderately differentiated adenocarcinoma at 36 cm  --staging PET scan 01/28/22 showed no FDG-avid disease. -Pt declined surgery, and wants to try chemoRT -Pt started concurrent chemotherapy and radiation 05/24/2022, and completed on July 02, 2022. -She is recovering well from chemoradiation.  Plan to repeat CT scan and endoscopy in 6-7 weeks -will repeat EGD in 2 months. His GI Dr. Bary Castilla has retired, will refer him to GI Dr. Benson Norway

## 2022-09-21 ENCOUNTER — Inpatient Hospital Stay: Payer: BC Managed Care – PPO | Attending: Physician Assistant

## 2022-09-21 ENCOUNTER — Ambulatory Visit (HOSPITAL_COMMUNITY): Admission: RE | Admit: 2022-09-21 | Payer: BC Managed Care – PPO | Source: Ambulatory Visit

## 2022-09-28 ENCOUNTER — Other Ambulatory Visit: Payer: Self-pay

## 2022-09-28 ENCOUNTER — Telehealth: Payer: Self-pay | Admitting: Hematology

## 2022-09-28 NOTE — Telephone Encounter (Signed)
Called patient per 4/2 IB message to reschedule appointments around CT results. Patient only able to do morning call. Patient rescheduled and notified.

## 2022-09-30 ENCOUNTER — Telehealth: Payer: Self-pay

## 2022-09-30 ENCOUNTER — Other Ambulatory Visit: Payer: Self-pay

## 2022-09-30 NOTE — Telephone Encounter (Signed)
This nurse attempted to reach patient related to appointment for follow up visit.  Unable to reach patient or leave a message.  Will attempt to reach patient again.

## 2022-10-01 ENCOUNTER — Other Ambulatory Visit: Payer: Self-pay

## 2022-10-01 ENCOUNTER — Inpatient Hospital Stay: Payer: BC Managed Care – PPO | Admitting: Physician Assistant

## 2022-10-12 ENCOUNTER — Telehealth: Payer: Self-pay

## 2022-10-12 DIAGNOSIS — K5100 Ulcerative (chronic) pancolitis without complications: Secondary | ICD-10-CM | POA: Diagnosis not present

## 2022-10-12 DIAGNOSIS — L732 Hidradenitis suppurativa: Secondary | ICD-10-CM | POA: Diagnosis not present

## 2022-10-12 DIAGNOSIS — C155 Malignant neoplasm of lower third of esophagus: Secondary | ICD-10-CM | POA: Diagnosis not present

## 2022-10-12 NOTE — Telephone Encounter (Signed)
FYI

## 2022-10-12 NOTE — Telephone Encounter (Signed)
Jodene Nam, patient's Nurse Case Manager with [facility] states she would like to leave her contact information in case there are any coordination needs for patient.  Jodene Nam states she has been unable to reach patient.

## 2022-10-14 NOTE — Progress Notes (Unsigned)
[facility] HEMATOLOGY-ONCOLOGY TeleHEALTH VISIT PROGRESS NOTE   I connected with Bruce Graham on 10/18/22 at  1:30 PM EDT by telephone and verified that I am speaking with the correct person using two identifiers.  I discussed the limitations, risks, security and privacy concerns of performing an evaluation and management service by telemedicine and the availability of in-person appointments. I also discussed with the patient that there may be a patient responsible charge related to this service. The patient expressed understanding and agreed to proceed.  Other persons participating in the visit and their role in the encounter: None  Patient's location: Home  Provider's location: Office  Sherlene Shams, MD [address] [place] [address]  DIAGNOSIS: f/u of Esophageal cancer [facility])   Oncology History  Esophageal cancer  12/16/2021 Procedure   Upper GI endoscopy, Dr. Lemar Livings  Impression: - LA Grade C reflux esophagitis with no bleeding. Rule out Barrett's esophagus. Biopsied. - Small hiatal hernia. - Normal examined duodenum.   12/16/2021 Initial Biopsy   DIAGNOSIS:  A. ESOPHAGUS, 40 CM; COLD BIOPSY:  - SQUAMOCOLUMNAR MUCOSA WITH INTESTINAL METAPLASIA, COMPATIBLE WITH HISTORY OF BARRETT'S ESOPHAGUS, WITH FOCAL LOW GRADE DYSPLASIA.  - NEGATIVE FOR HIGH-GRADE DYSPLASIA AND MALIGNANCY.   B. ESOPHAGUS, 38 CM; BIOPSY:  - SQUAMOCOLUMNAR MUCOSA WITH INTESTINAL METAPLASIA, COMPATIBLE WITH HISTORY OF BARRETT'S ESOPHAGUS, WITH FOCAL LOW GRADE DYSPLASIA.  - NEGATIVE FOR HIGH-GRADE DYSPLASIA AND MALIGNANCY.   C. ESOPHAGUS, 36 CM; COLD BIOPSY:  - INVASIVE MODERATELY DIFFERENTIATED ADENOCARCINOMA, ARISING IN A BACKGROUND OF BARRETT'S ESOPHAGUS WITH HIGH-GRADE DYSPLASIA.   D. COLON, CECUM; COLD BIOPSY:  - CHRONIC COLITIS WITH MODERATE ACTIVITY (CRYPTITIS AND CRYPT ABSCESSES).  - NEGATIVE FOR GRANULOMA, DYSPLASIA, AND MALIGNANCY.   E. COLON, DESCENDING; COLD  BIOPSY:  - CHRONIC COLITIS WITH MODERATE ACTIVITY (CRYPTITIS AND CRYPT ABSCESSES). - NEGATIVE FOR GRANULOMA, DYSPLASIA, AND MALIGNANCY.   F. RECTUM; COLD BIOPSY:  - CHRONIC PROCTITIS WITH MODERATE ACTIVITY (CRYPTITIS AND CRYPT ABSCESSES).  - NEGATIVE FOR GRANULOMA, DYSPLASIA, AND MALIGNANCY.   Comment:  Biopsy sections taken from the cecum, descending colon, and rectum display crypt architectural abnormalities (crypt branching and bifid crypts), basal lymphoplasmacytosis, and active intramucosal inflammation ranging from cryptitis to crypt abscesses.  The features are highly suggestive of an evolving inflammatory bowel disease.  Clinical and endoscopic correlation is recommended.    12/16/2021 Cancer Staging   Staging form: Esophagus - Adenocarcinoma, AJCC 8th Edition - Clinical stage from 12/16/2021: Stage I (cT1b, cN0, cM0, G2) - Signed by Malachy Mood, MD on 04/27/2022 Stage prefix: Initial diagnosis Total positive nodes: 0 Histologic grading system: 3 grade system Type of surgical resection with curative intent: Endoscopic mucosal resection   12/17/2021 Initial Diagnosis   Esophageal cancer (HCC)   12/25/2021 Imaging   EXAM: CT CHEST WITH CONTRAST  IMPRESSION: 1. Moderate wall thickening involving the distal esophagus near the GE junction but no paraesophageal or upper abdominal lymphadenopathy. 2. No findings for pulmonary metastatic disease. 3. Age advanced atherosclerotic calcifications involving the coronary arteries. 4. Advanced emphysematous changes with paraseptal bulla.   Aortic Atherosclerosis (ICD10-I70.0) and Emphysema (ICD10-J43.9).   01/04/2022 Procedure   Upper EUS, Dr. Shana Chute  EGD Impressions: - Esophageal mucosal changes consistent with long-segment Barrett's esophagus. - Cratered esophageal ulcer at 40 cm (up to 8 mm in largest dimension). Biopsied. Suspect this is the site of previous biopsy proven adenocarcinoma. - Normal stomach. - Normal examined  duodenum.   EUS Impressions: - There was no sign of significant pathology  in the esophagus. No obvious esophageal mass was seen. Unable to T stage due to no mass lesion seen. - No periesophageal or perigastric lymph nodes visualized. - Normal visualized portions of the liver. - Normal celiac region.   01/04/2022 Pathology Results   DIAGNOSIS    A. Esophagus, 40 cm, esophageal ulcer, endoscopic biopsy:   Barrett esophagus with high-grade dysplasia. (See comment)   Cytomegalovirus esophagitis.    Comment: A p53 immunostain demonstrates loss of p53 expression within the atypical gastric mucosa, consistent with TP53 null mutant, supporting a diagnosis of high-grade dysplasia. CMV immunohistochemistry highlights rare positive cells, corroborated by viral cytopathic effect on H&E. PASD stain is negative for fungal organisms. Multiple step sections are examined.        01/28/2022 PET scan   FDG PET SBMT W NONDIAGNOSTIC CONCURRENT CT INITIAL   IMPRESSION:  1.  No evidence of FDG avid disease.   2.  Gluteal cleft findings favored to infectious/inflammatory, correlate with physical exam.    03/15/2022 Pathology Results   DIAGNOSIS    A. Esophagus, 36 cm, endomucosal resection:   Invasive, moderately differentiated adenocarcinoma. Tumor is seen throughout a thickened muscularis mucosal layer. Only a small amount of submucosa is present, best seen in block A2, and here tumor is present within the submucosa. Cauterized tumor is present at the deep margin (orange ink) and a peripheral margin (blue ink). Negative for lymphovascular and perineural invasion. See summary in synoptic report.   B. Esophagus, 36 cm #2, endomucosal resection:   Invasive, moderately-differentiated adenocarcinoma. Tumor is seen throughout a thickened muscularis mucosal layer. A good representation of submucosal tissue is also present, with small areas of tumor seen in submucosal tissue and focally at the deep (orange  ink) margin. Tumor is also present at a peripheral margin (blue ink). Negative for lymphovascular and perineural invasion. See summary in synoptic report.   C. Esophagus, 36 cm #3, endomucosal resection:   Invasive moderately differentiated adenocarcinoma. Tumor is focally seen in a thickened muscularis mucosal layer. A mucous gland is present but no submucosal tissue is present in this small specimen. Cauterized tumor is present at orange ink (the margin of this small specimen is entirely inked orange). Negative for lymphovascular and perineural invasion. See summary in synoptic report.   D. Esophagus, 39 cm, biopsy:   Gastric type mucosa. No squamous mucosa is seen. Negative for intestinal metaplasia.  Negative for dysplasia.   E. Esophagus, 37 cm, biopsy:    Focal area concerning for high grade dysplasia is seen in a background of squamocolumnar junctional mucosa with goblet cell intestinal metaplasia (Barrett's esophagus).  The focal area is best seen on level 1, deeper levels 3-5 do not demonstrate the concerning area. Negative for malignancy.   Comment:  See the synoptic report for summary.  While I very much suspect the T-stage is pT1b (submucosa), this is technically considered indefinite in the presence of positive deep margins. MSI testing has been requested on block B2 of this case; results will be issued separately.     05/24/2022 -  Chemotherapy   Patient is on Treatment Plan : ESOPHAGUS Carboplatin + Paclitaxel Weekly X 6 Weeks with XRT      CURRENT THERAPY: Observation   INTERVAL HISTORY: Bruce Graham 52 y.o. male and I connected via telephone encounter today. He was last seen by Dr. Mosetta Putt on 08/02/22. The patient completed concurrent chemo/radiation in January 2024. At his last appointment, he was recovering well from chemotherapy/radiation. He was referred to Dr.  Hung from GI for repeat EGD. He followed up with Dr. Mia Creek for his ulcerative colitis who is  arranging for EGD and colonoscopy next week on 10/26/22. At his last appointment, Dr. Mosetta Putt had discussed salvage surgery if he does not have a completed response to treatment.   Overall, the patient states he feels "good".  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  He is needing extension of his intermittent FMLA so he is able to attend CT scans, follow-up doctors appointments, and procedures such as his EGD.  He needs the contact information to the family department.  The patient enjoys hunting and fishing.  He went hunting the other day and walked long distances and climbed hills without any significant shortness of breath.  His CT scan showed some postinflammatory changes in the lungs.  The patient initially denied any unusual cough but later stated his son had bronchitis and the patient also has a mild cough.  He mentions that azithromycin and doxycycline do not work well for him.  He states amoxicillin works the best.  He denies any allergies to any antibiotics.  He denies any fever, chills, or night sweats.  He reports he is lost some weight because he has been eating smaller portions. He states he weighs about 211 lbs. Denies any odynophagia or dysphagia.  Denies any abdominal pain, nausea, vomiting, diarrhea, or constipation.  He recently had a restaging CT scan performed.  His visit today is to review his scan results.    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  MEDICATIONS:  Current Outpatient Medications  Medication Sig Dispense Refill   amoxicillin-clavulanate (AUGMENTIN) 875-125 MG tablet Take 1 tablet by mouth 2 (two) times daily. 14 tablet 0   methylPREDNISolone (MEDROL DOSEPAK) 4 MG TBPK tablet Use as instructed 21 tablet 0   albuterol (VENTOLIN HFA) 108 (90 Base) MCG/ACT inhaler Inhale 2 puffs into the lungs every 6 (six) hours as needed for wheezing or shortness of breath. (Patient not taking: Reported on 06/29/2022) 8 g 2   ALPRAZolam (XANAX) 0.25 MG tablet Take 1 tablet (0.25 mg total) by mouth 2 (two) times daily as needed for anxiety. (Patient not taking: Reported on 06/29/2022) 30 tablet 0   amLODipine (NORVASC) 2.5 MG tablet Take 1 tablet (2.5 mg total) by mouth daily. (Patient not taking: Reported on 06/29/2022) 90 tablet 1   ezetimibe (ZETIA) 10 MG tablet TAKE 1 TABLET BY MOUTH ONCE DAILY 90 tablet 3   gabapentin (NEURONTIN) 100 MG capsule Take 1 capsule (100 mg total) by mouth 3 (three) times daily. 30 capsule 2   No current facility-administered medications for this visit.    SURGICAL HISTORY:  Past Surgical History:  Procedure Laterality Date   COLONOSCOPY WITH PROPOFOL N/A 03/06/2018  Procedure: COLONOSCOPY WITH PROPOFOL;  Surgeon: Toney Reil, MD;  Location: [facility] ENDOSCOPY;  Service: Gastroenterology;  Laterality: N/A;   COLONOSCOPY WITH PROPOFOL N/A 12/16/2021   Procedure: COLONOSCOPY WITH PROPOFOL;  Surgeon: Earline Mayotte, MD;  Location: [facility] ENDOSCOPY;  Service: Endoscopy;  Laterality: N/A;   ESOPHAGOGASTRODUODENOSCOPY (EGD) WITH PROPOFOL N/A 03/06/2018   Procedure: ESOPHAGOGASTRODUODENOSCOPY (EGD) WITH PROPOFOL;  Surgeon: Toney Reil, MD;  Location: [facility] ENDOSCOPY;  Service: Gastroenterology;  Laterality: N/A;   ESOPHAGOGASTRODUODENOSCOPY (EGD) WITH PROPOFOL N/A 12/16/2021   Procedure: ESOPHAGOGASTRODUODENOSCOPY  (EGD) WITH PROPOFOL;  Surgeon: Earline Mayotte, MD;  Location: [facility] ENDOSCOPY;  Service: Endoscopy;  Laterality: N/A;   HERNIA REPAIR     INCISION AND DRAINAGE ABSCESS N/A 05/02/2020   Procedure: INCISION AND DRAINAGE ABSCESS;  Surgeon: Earline Mayotte, MD;  Location: [facility] ORS;  Service: General;  Laterality: N/A;   INCISION AND DRAINAGE PERIRECTAL ABSCESS Right 06/16/2015   Procedure: IRRIGATION AND DEBRIDEMENT right inner thigh ABSCESS;  Surgeon: Lattie Haw, MD;  Location: [facility] ORS;  Service: General;  Laterality: Right;   INSERTION OF MESH N/A 12/20/2017   Procedure: INSERTION OF MESH;  Surgeon: Ancil Linsey, MD;  Location: [facility] ORS;  Service: General;  Laterality: N/A;   KNEE ARTHROSCOPY WITH ANTERIOR CRUCIATE LIGAMENT (ACL) REPAIR Left 2002   KNEE SURGERY Left    PILONIDAL CYST EXCISION N/A 05/02/2020   Procedure: CYST EXCISION PILONIDAL EXTENSIVE;  Surgeon: Earline Mayotte, MD;  Location: [facility] ORS;  Service: General;  Laterality: N/A;   UMBILICAL HERNIA REPAIR N/A 12/20/2017   Procedure: HERNIA REPAIR UMBILICAL ADULT;  Surgeon: Ancil Linsey, MD;  Location: [facility] ORS;  Service: General;  Laterality: N/A;   VIDEO ASSISTED THORACOSCOPY (VATS)/WEDGE RESECTION Right 04/29/2022   Procedure: RIGHT VIDEO ASSISTED THORACOSCOPY (VATS)/ STAPELING OF BLEBS;  Surgeon: Loreli Slot, MD;  Location: [facility] OR;  Service: Thoracic;  Laterality: Right;    REVIEW OF SYSTEMS:   Review of Systems  Constitutional: Positive for weight loss.  Negative for appetite change, chills, fatigue, and fever.  HENT: Negative for mouth sores, nosebleeds, sore throat and trouble swallowing.   Eyes: Negative for eye problems and icterus.  Respiratory: Positive for cough. Negative for hemoptysis, shortness of breath and wheezing.   Cardiovascular: Negative for chest pain and leg swelling.  Gastrointestinal: Negative for abdominal pain, constipation, diarrhea, nausea and vomiting.  Genitourinary:  Negative for bladder incontinence, difficulty urinating, dysuria, frequency and hematuria.   Musculoskeletal: Negative for back pain, gait problem, neck pain and neck stiffness.  Skin: Negative for itching and rash.  Neurological: Negative for dizziness, extremity weakness, gait problem, headaches, light-headedness and seizures.  Hematological: Negative for adenopathy. Does not bruise/bleed easily.  Psychiatric/Behavioral: Negative for confusion, depression and sleep disturbance. The patient is not nervous/anxious.     PHYSICAL EXAMINATION:  There were no vitals taken for this visit.  ECOG PERFORMANCE STATUS: 1  Physical Exam  Constitutional: Oriented to person, place, and time Psychiatric: Mood, memory and judgment normal.  Vitals reviewed.  LABORATORY DATA: Lab Results  Component Value Date   WBC 6.8 10/15/2022   HGB 14.7 10/15/2022   HCT 44.1 10/15/2022   MCV 88.2 10/15/2022   PLT 330 10/15/2022      Chemistry      Component Value Date/Time   NA 137 10/15/2022 0810   NA CANCELED 12/04/2021 1545   K 3.6 10/15/2022 0810   CL 105 10/15/2022 0810   CO2 27 10/15/2022 0810   BUN 9  10/15/2022 0810   BUN CANCELED 12/04/2021 1545   CREATININE 1.00 10/15/2022 0810      Component Value Date/Time   CALCIUM 9.5 10/15/2022 0810   ALKPHOS 99 10/15/2022 0810   AST 18 10/15/2022 0810   ALT 11 10/15/2022 0810   BILITOT 0.4 10/15/2022 0810       RADIOGRAPHIC STUDIES:  CT Chest W Contrast  Result Date: 10/18/2022 CLINICAL DATA:  Esophageal cancer.  Restaging. * Tracking Code: BO * EXAM: CT CHEST WITH CONTRAST TECHNIQUE: Multidetector CT imaging of the chest was performed during intravenous contrast administration. RADIATION DOSE REDUCTION: This exam was performed according to the departmental dose-optimization program which includes automated exposure control, adjustment of the mA and/or kV according to patient size and/or use of iterative reconstruction technique. CONTRAST:   75mL OMNIPAQUE IOHEXOL 300 MG/ML  SOLN COMPARISON:  04/28/2022 FINDINGS: Cardiovascular: Heart size is normal. Coronary artery and aortic atherosclerotic calcifications. No pericardial effusion. Mediastinum/Nodes: Thyroid gland and trachea appear normal. Mild wall thickening involving the distal esophagus noted. No enlarged axillary, supraclavicular, mediastinal, or hilar lymph nodes. Lungs/Pleura: Advanced changes of bullous emphysema. New postsurgical changes identified within the right lung status post pneumothorax. Progressive postinflammatory changes are identified with bilateral peripheral and lower lung zone predominant interstitial reticulation and ground-glass attenuation is identified. Within the posteromedial right lung base there is a new nonspecific 7 mm nodule, image 126/5. Similarly, there is a nonspecific nodular subpleural density within the paravertebral left lower lobe measuring 9 mm, image 127/5. Calcified granulomas again seen within the periphery of the right base, image 126/5. Upper Abdomen: No acute abnormality. No adenopathy identified within the imaged portions of the upper abdomen. Small exophytic cyst off the upper pole of the right kidney measures 1 cm, image 175/2. No follow-up imaging recommended. Musculoskeletal: No chest wall abnormality. No acute or significant osseous findings. IMPRESSION: 1. New postsurgical changes identified within the right lung status post repair of pneumothorax. 2. Progressive postinflammatory changes are identified with bilateral peripheral and lower lung zone predominant interstitial reticulation and ground-glass attenuation. 3. New nonspecific 7 mm nodule within the posteromedial right lung base. Similarly, there is a nonspecific nodular subpleural density within the paravertebral left lower lobe measuring 9 mm. Findings are nonspecific, but given the progressive postinflammatory changes within the lower lung zones these are favored to represent a benign  process. Consider short-term interval follow-up in 3 months with repeat CT of the chest to ensure resolution. 4. Mild nonspecific distal esophageal wall thickening is again noted. No discrete mass or adenopathy identified. No highly suspicious findings identified to suggest metastatic disease. 5. Aortic Atherosclerosis (ICD10-I70.0) and Emphysema (ICD10-J43.9). Electronically Signed   By: Signa Kell M.D.   On: 10/18/2022 12:00    ASSESSMENT/PLAN:  Bruce Graham is a 52 y.o. male with    Esophageal cancer (HCC)  cT1N0M0, stage MMR normal, HER2(+), PD-L1 2%  -Screening EGD showed Screening EGD on 12/16/21 showed only reflux esophagitis, no bleeding. Pathology revealed invasive moderately differentiated adenocarcinoma at 36 cm  --staging PET scan 01/28/22 showed no FDG-avid disease. -Pt declined surgery, and wants to try chemoRT -Pt started concurrent chemotherapy and radiation 05/24/2022, and completed on July 02, 2022. -He recovering well from chemoradiation.   -He is schedule for repeat EGD and colonoscopy on 10/26/22 by Dr. Mia Creek -He had a restaging CT scan performed. I reviewed the results with the patient today. I also reviewed the results with Dr. Mosetta Putt. The scan does not show obvious disease progression. The scan shows some  nonspecific inflammatory changes in the lungs with a new nonspecific 7 mm nodule and another nonspecific nodular subpleural density in the paravertebral left lower lobe. This is favored to be progressive post inflammatory changes within the lower lung zones and benign process. However, we will monitor this closely and arrange follow up CT in 3 months.  -In the meantime, the patient states he had mild cough and recently had mild bronchitis which he contracted from his son. I will send medrol dose pack and Augmentin to the pharmacy. He states Augmentin works the best for him over azithromycin or doxycycline.Additionally, he likes to spend time outdoors and prefers to avoid  doxycycline due to possible photosensitivity skin rash while taking this medication. He was cautioned to call back with any new or worsening symptoms, although it seems his symptoms are mild.  -We will see him back for a follow up in 3 months after his CT scan as long as his upcoming EGD does not show any concerning findings.  -The patient requires extension of his intermittent FMLA. He is presently still working but occasionally needs to take time off for appointments, procedures, and lab work. I provided him the contact information via mychart to the [facility] department.    PLAN: - Endoscopy and colonoscopy on 10/26/22.  -f/u in 3 months after CT scan to review the results -Given information to [facility] department.     I discussed the assessment and treatment plan with the patient. The patient was provided an opportunity to ask questions and all were answered. The patient agreed with the plan and demonstrated an understanding of the instructions.  The patient was advised to call back or seek an in-person evaluation if the symptoms worsen or if the condition fails to improve as anticipated.  I provided 20-29 minutes of non face-to-face telephone visit time during this encounter, and > 50% was spent counseling as documented under my assessment & plan.  Makiah Foye L Kamren Heskett, PA-C 10/18/2022 2:02 PM  Orders Placed This Encounter  Procedures   CT Chest W Contrast    Standing Status:   Future    Standing Expiration Date:   10/18/2023    Order Specific Question:   If indicated for the ordered procedure, I authorize the administration of contrast media per Radiology protocol    Answer:   Yes    Order Specific Question:   Does the patient have a contrast media/X-ray dye allergy?    Answer:   No    Order Specific Question:   Preferred imaging location?    Answer:   [facility]     Kimiah Hibner L Itza Maniaci, PA-C 10/18/22 No orders of the defined types were placed in this encounter.

## 2022-10-15 ENCOUNTER — Encounter (HOSPITAL_COMMUNITY): Payer: Self-pay

## 2022-10-15 ENCOUNTER — Inpatient Hospital Stay: Payer: BC Managed Care – PPO | Attending: Physician Assistant

## 2022-10-15 ENCOUNTER — Ambulatory Visit (HOSPITAL_COMMUNITY)
Admission: RE | Admit: 2022-10-15 | Discharge: 2022-10-15 | Disposition: A | Discharge status: Home / Self Care | Payer: BC Managed Care – PPO | Source: Ambulatory Visit | Attending: Hematology | Admitting: Hematology

## 2022-10-15 ENCOUNTER — Other Ambulatory Visit: Payer: Self-pay

## 2022-10-15 DIAGNOSIS — R911 Solitary pulmonary nodule: Secondary | ICD-10-CM | POA: Diagnosis not present

## 2022-10-15 DIAGNOSIS — J939 Pneumothorax, unspecified: Secondary | ICD-10-CM | POA: Diagnosis not present

## 2022-10-15 DIAGNOSIS — C155 Malignant neoplasm of lower third of esophagus: Secondary | ICD-10-CM

## 2022-10-15 DIAGNOSIS — J439 Emphysema, unspecified: Secondary | ICD-10-CM | POA: Diagnosis not present

## 2022-10-15 DIAGNOSIS — C159 Malignant neoplasm of esophagus, unspecified: Secondary | ICD-10-CM | POA: Insufficient documentation

## 2022-10-15 DIAGNOSIS — Z923 Personal history of irradiation: Secondary | ICD-10-CM | POA: Insufficient documentation

## 2022-10-15 DIAGNOSIS — R918 Other nonspecific abnormal finding of lung field: Secondary | ICD-10-CM | POA: Insufficient documentation

## 2022-10-15 LAB — CBC WITH DIFFERENTIAL (CANCER CENTER ONLY)
Abs Immature Granulocytes: 0.01 10*3/uL (ref 0.00–0.07)
Basophils Absolute: 0 10*3/uL (ref 0.0–0.1)
Basophils Relative: 0 %
Eosinophils Absolute: 0.2 10*3/uL (ref 0.0–0.5)
Eosinophils Relative: 3 %
HCT: 44.1 % (ref 39.0–52.0)
Hemoglobin: 14.7 g/dL (ref 13.0–17.0)
Immature Granulocytes: 0 %
Lymphocytes Relative: 20 %
Lymphs Abs: 1.4 10*3/uL (ref 0.7–4.0)
MCH: 29.4 pg (ref 26.0–34.0)
MCHC: 33.3 g/dL (ref 30.0–36.0)
MCV: 88.2 fL (ref 80.0–100.0)
Monocytes Absolute: 0.8 10*3/uL (ref 0.1–1.0)
Monocytes Relative: 11 %
Neutro Abs: 4.4 10*3/uL (ref 1.7–7.7)
Neutrophils Relative %: 66 %
Platelet Count: 330 10*3/uL (ref 150–400)
RBC: 5 MIL/uL (ref 4.22–5.81)
RDW: 13.4 % (ref 11.5–15.5)
WBC Count: 6.8 10*3/uL (ref 4.0–10.5)
nRBC: 0 % (ref 0.0–0.2)

## 2022-10-15 LAB — CMP (CANCER CENTER ONLY)
ALT: 11 U/L (ref 0–44)
AST: 18 U/L (ref 15–41)
Albumin: 3.8 g/dL (ref 3.5–5.0)
Alkaline Phosphatase: 99 U/L (ref 38–126)
Anion gap: 5 (ref 5–15)
BUN: 9 mg/dL (ref 6–20)
CO2: 27 mmol/L (ref 22–32)
Calcium: 9.5 mg/dL (ref 8.9–10.3)
Chloride: 105 mmol/L (ref 98–111)
Creatinine: 1 mg/dL (ref 0.61–1.24)
GFR, Estimated: >60 mL/min (ref >60)
Glucose, Bld: 87 mg/dL (ref 70–99)
Potassium: 3.6 mmol/L (ref 3.5–5.1)
Sodium: 137 mmol/L (ref 135–145)
Total Bilirubin: 0.4 mg/dL (ref 0.3–1.2)
Total Protein: 8.1 g/dL (ref 6.5–8.1)

## 2022-10-15 MED ORDER — IOHEXOL 300 MG/ML  SOLN
75.0000 mL | Freq: Once | INTRAMUSCULAR | Status: AC | PRN
  Administered 2022-10-15: 75 mL via INTRAVENOUS

## 2022-10-15 MED ORDER — SODIUM CHLORIDE (PF) 0.9 % IJ SOLN
INTRAMUSCULAR | Status: AC
  Filled 2022-10-15: qty 50

## 2022-10-18 ENCOUNTER — Inpatient Hospital Stay (HOSPITAL_BASED_OUTPATIENT_CLINIC_OR_DEPARTMENT_OTHER): Payer: BC Managed Care – PPO | Admitting: Physician Assistant

## 2022-10-18 ENCOUNTER — Encounter: Payer: Self-pay | Admitting: Physician Assistant

## 2022-10-18 ENCOUNTER — Telehealth: Payer: BC Managed Care – PPO | Admitting: Physician Assistant

## 2022-10-18 DIAGNOSIS — C159 Malignant neoplasm of esophagus, unspecified: Secondary | ICD-10-CM

## 2022-10-18 DIAGNOSIS — R911 Solitary pulmonary nodule: Secondary | ICD-10-CM | POA: Diagnosis not present

## 2022-10-18 DIAGNOSIS — R059 Cough, unspecified: Secondary | ICD-10-CM

## 2022-10-18 MED ORDER — AMOXICILLIN-POT CLAVULANATE 875-125 MG PO TABS
1.0000 | ORAL_TABLET | Freq: Two times a day (BID) | ORAL | 0 refills | Status: DC
Start: 2022-10-18 — End: 2022-10-26

## 2022-10-18 MED ORDER — METHYLPREDNISOLONE 4 MG PO TBPK
ORAL_TABLET | ORAL | 0 refills | Status: DC
Start: 2022-10-18 — End: 2022-10-26

## 2022-10-18 NOTE — Addendum Note (Signed)
Addended by: Mickie Hillier on: 10/18/2022 02:32 PM   Modules accepted: Orders

## 2022-10-20 ENCOUNTER — Telehealth: Payer: BC Managed Care – PPO | Admitting: Physician Assistant

## 2022-10-20 ENCOUNTER — Other Ambulatory Visit: Payer: Self-pay

## 2022-10-24 IMAGING — CT CT CARDIAC CORONARY ARTERY CALCIUM SCORE
3 series · 14 of 20 positions shown, 16 images · non-contrast
Comparison: None.
COMPARISON: None.

Addendum:
EXAM:
OVER-READ INTERPRETATION  CT CHEST

The following report is an over-read performed by radiologist Dr.
Rantona Bhebhe [REDACTED] on 03/17/2021. This
over-read does not include interpretation of cardiac or coronary
anatomy or pathology. The coronary calcium score interpretation by
the cardiologist is attached.
CLINICAL DATA: Cardiovascular Disease Risk stratification
Coronary Calcium Score
TECHNIQUE: A gated, non-contrast computed tomography scan of the heart was
performed using 3mm slice thickness. Axial images were analyzed on a
dedicated workstation. Calcium scoring of the coronary arteries was
performed using the Agatston method.

[Series 2: cascseq 2.0 sa36 70% (id) · axial · 0.42mm/px · z∈[-225,-145]mm · 4 of 68 slices shown]
[im 14/68  vessel]
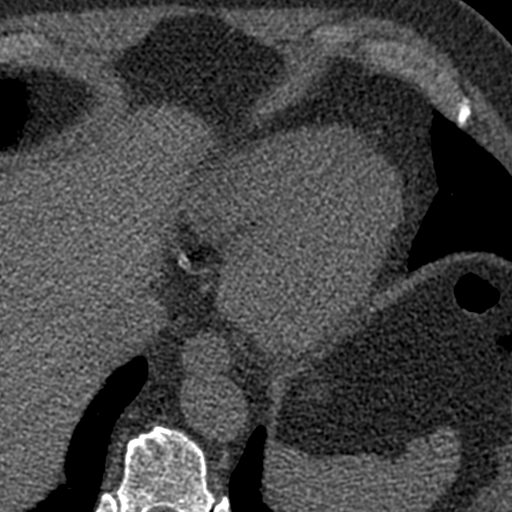
[im 27/68  vessel]
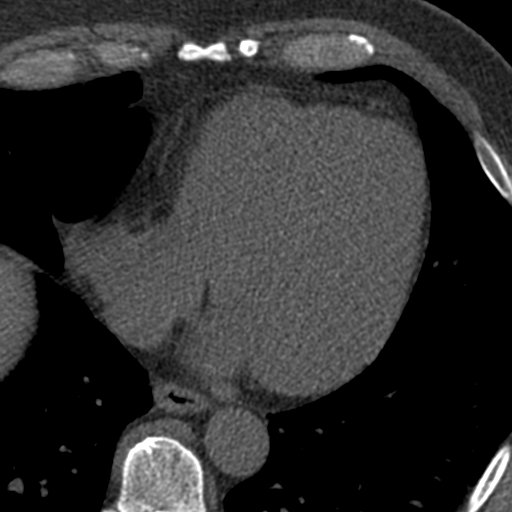
[im 41/68  vessel]
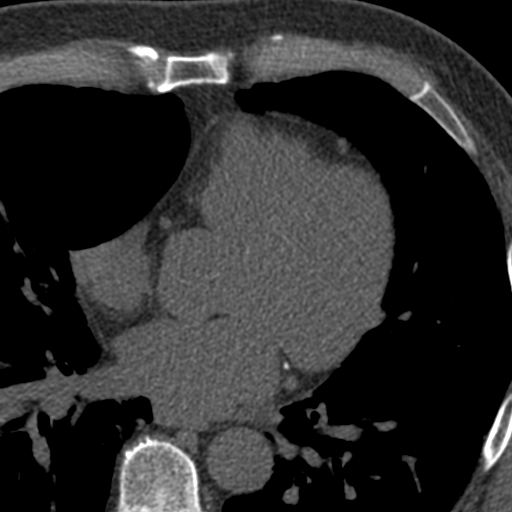
[im 54/68  vessel]
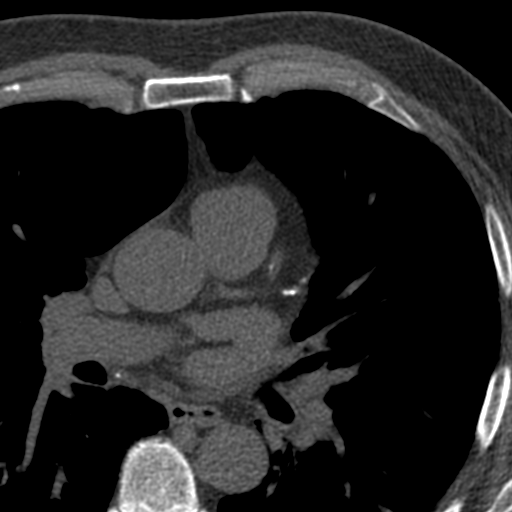

[Series 3: cascseq 2.0 bf37 st · axial · 0.73mm/px · z∈[-229,-141]mm · 5 of 68 slices shown, 7 images]
[im 12/68  vessel]
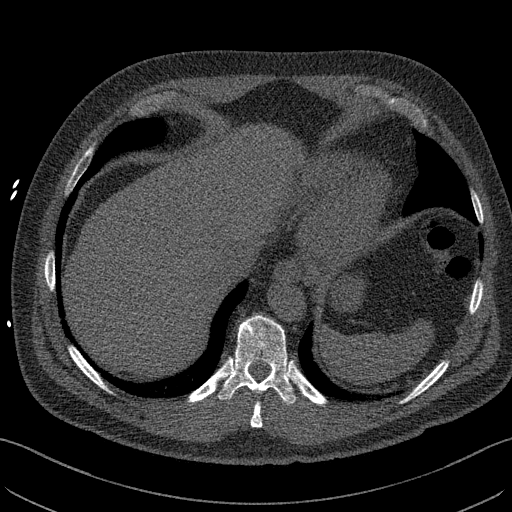
[im 12/68  lung]
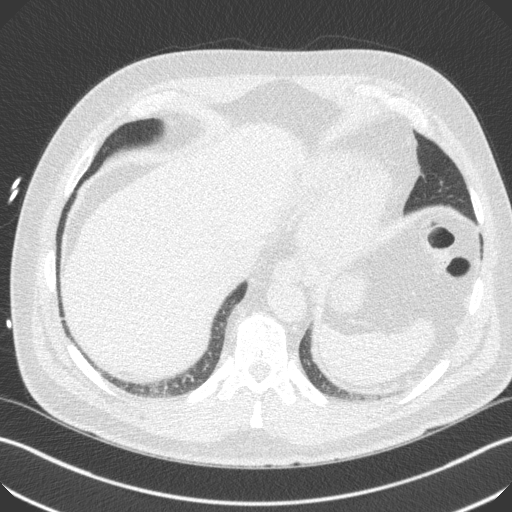
[im 23/68  vessel]
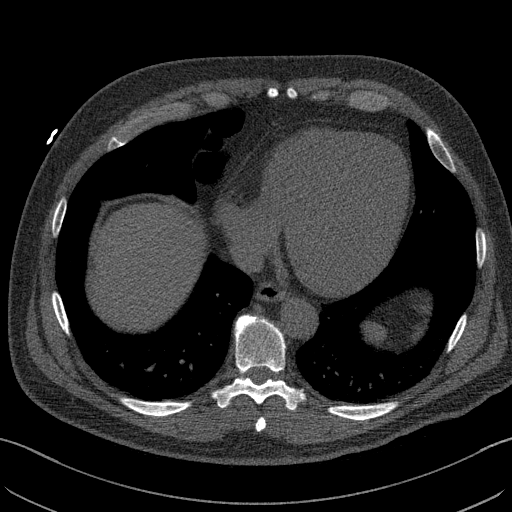
[im 34/68  vessel]
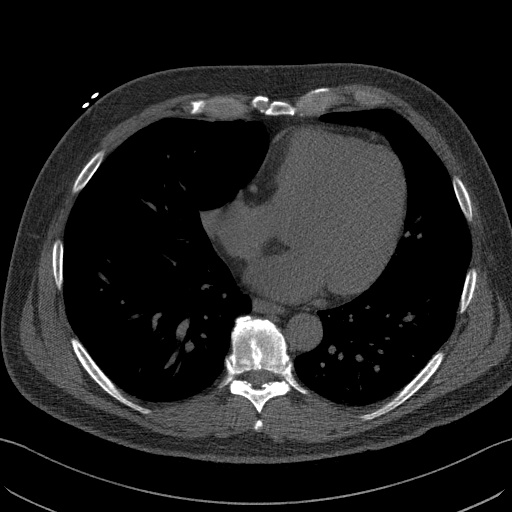
[im 45/68  vessel]
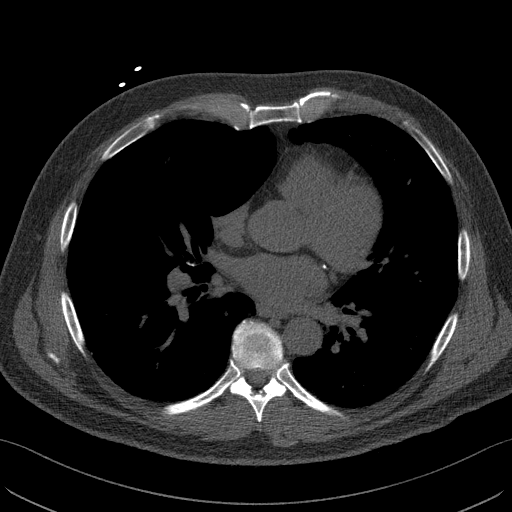
[im 56/68  vessel]
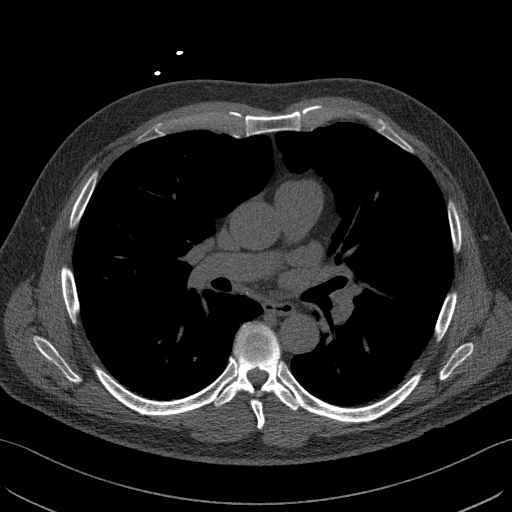
[im 56/68  lung]
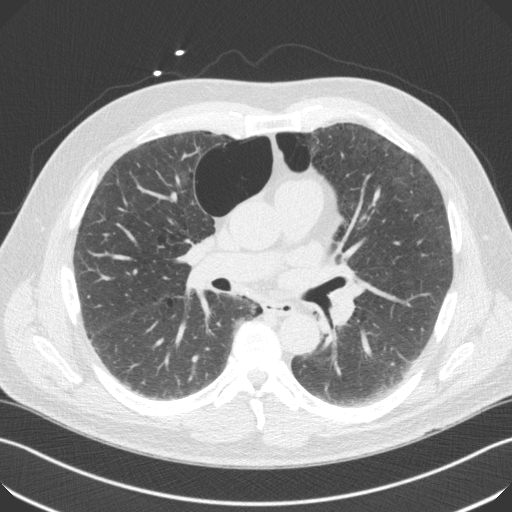

[Series 4: cascseq 2.0 br59 lung · axial · 0.71mm/px · z∈[-229,-141]mm · 5 of 68 slices shown]
[im 12/68  lung]
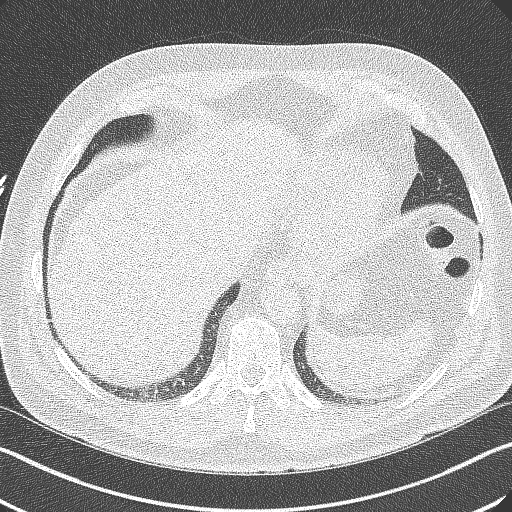
[im 23/68  lung]
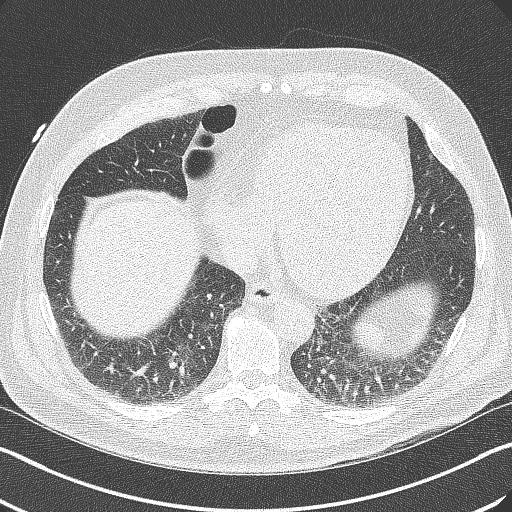
[im 34/68  lung]
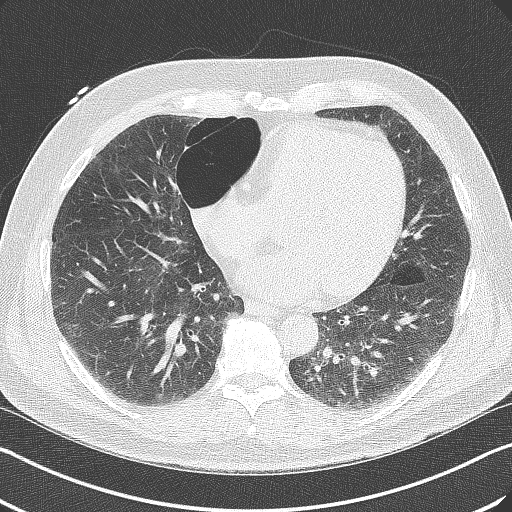
[im 45/68  lung]
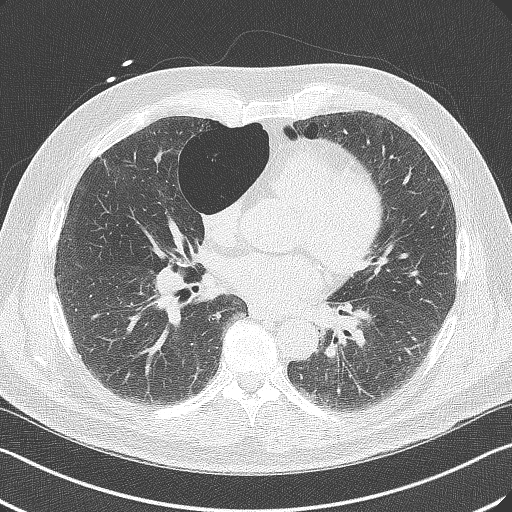
[im 56/68  lung]
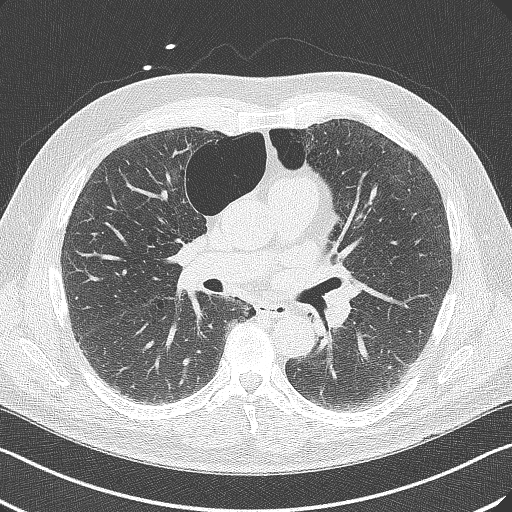

[14 of 20 positions shown; findings below may reference images not displayed]

FINDINGS: Mild centrilobular and severe paraseptal emphysema, with extensive
bullous disease noted adjacent to the mediastinum, most evident in
the medial aspect of the right upper lobe. 6 x 4 mm (mean diameter
of 5 mm) right upper lobe nodule abutting the minor fissure (axial
image 20 of series 4). Small calcified granuloma also noted in the
periphery of the right lower lobe at the base. Within the visualized
portions of the thorax there are no other larger more suspicious
appearing pulmonary nodules or masses, there is no acute
consolidative airspace disease, no pleural effusions, no
pneumothorax and no lymphadenopathy. Visualized portions of the
upper abdomen are unremarkable. There are no aggressive appearing
lytic or blastic lesions noted in the visualized portions of the
skeleton.
IMPRESSION: 1. Mild centrilobular and severe paraseptal emphysema with bullous
disease, as above.
2. Small right upper lobe pulmonary nodule with a mean diameter 5
mm. No follow-up needed if patient is low-risk. Non-contrast chest
CT can be considered in 12 months if patient is high-risk. This
recommendation follows the consensus statement: Guidelines for
Management of Incidental Pulmonary Nodules Detected on CT Images:
FINDINGS: Coronary arteries: Normal origins.

Coronary Calcium Score:

Left main: 0

Left anterior descending artery: 328

Left circumflex artery: 138

Right coronary artery: 265

Total: 731

Percentile: 99th

Pericardium: Normal.

Aorta: Normal caliber of ascending aorta. No aortic atherosclerosis
noted.

Non-cardiac: See separate report from [REDACTED].
IMPRESSION: Coronary calcium score of 731. This was 99th percentile for age-,
race-, and sex-matched controls.



If CAC=0, it is reasonable to withhold statin therapy and reassess
in 5 to 10 years, as long as higher risk conditions are absent
(diabetes mellitus, family history of premature CHD in first degree
relatives (males <55 years; females <65 years), cigarette smoking,
or LDL >=190 mg/dL).

If CAC is 1 to 99, it is reasonable to initiate statin therapy for
patients >=55 years of age.

If CAC is >=100 or >=75th percentile, it is reasonable to initiate
statin therapy at any age.

Cardiology referral should be considered for patients with CAC
scores >=400 or >=75th percentile.

*9296 AHA/ACC/AACVPR/AAPA/ABC/JULIENNE/DREY/PASTRAN/Hardesty/FANELLI/CHOUCHAN/SING
Guideline on the Management of Blood Cholesterol: A Report of the
American College of Cardiology/American Heart Association Task Force
on Clinical Practice Guidelines. J Am Coll Cardiol.
5913;73(24):9805-9151.

*** End of Addendum ***
EXAM:
OVER-READ INTERPRETATION  CT CHEST

The following report is an over-read performed by radiologist Dr.
Rantona Bhebhe [REDACTED] on 03/17/2021. This
over-read does not include interpretation of cardiac or coronary
anatomy or pathology. The coronary calcium score interpretation by
the cardiologist is attached.
FINDINGS: Mild centrilobular and severe paraseptal emphysema, with extensive
bullous disease noted adjacent to the mediastinum, most evident in
the medial aspect of the right upper lobe. 6 x 4 mm (mean diameter
of 5 mm) right upper lobe nodule abutting the minor fissure (axial
image 20 of series 4). Small calcified granuloma also noted in the
periphery of the right lower lobe at the base. Within the visualized
portions of the thorax there are no other larger more suspicious
appearing pulmonary nodules or masses, there is no acute
consolidative airspace disease, no pleural effusions, no
pneumothorax and no lymphadenopathy. Visualized portions of the
upper abdomen are unremarkable. There are no aggressive appearing
lytic or blastic lesions noted in the visualized portions of the
skeleton.
IMPRESSION: 1. Mild centrilobular and severe paraseptal emphysema with bullous
disease, as above.
2. Small right upper lobe pulmonary nodule with a mean diameter 5
mm. No follow-up needed if patient is low-risk. Non-contrast chest
CT can be considered in 12 months if patient is high-risk. This
recommendation follows the consensus statement: Guidelines for
Management of Incidental Pulmonary Nodules Detected on CT Images:

## 2022-10-26 ENCOUNTER — Ambulatory Visit: Admission: RE | Admit: 2022-10-26 | Payer: BC Managed Care – PPO | Source: Home / Self Care

## 2022-10-26 SURGERY — COLONOSCOPY WITH PROPOFOL
Anesthesia: General

## 2022-10-27 DEATH — deceased

## 2023-01-19 ENCOUNTER — Other Ambulatory Visit: Payer: BC Managed Care – PPO

## 2023-01-19 ENCOUNTER — Ambulatory Visit: Payer: BC Managed Care – PPO | Admitting: Hematology

## 2023-01-26 NOTE — Progress Notes (Signed)
This encounter was created in error - please disregard.
